# Patient Record
Sex: Male | Born: 1950 | Race: White | Hispanic: No | Marital: Married | Smoking: Never smoker
Health system: Southern US, Community
[De-identification: ages and names within clinical notes are randomized; demographics above are authoritative.]

## PROBLEM LIST (undated history)

## (undated) DIAGNOSIS — N400 Enlarged prostate without lower urinary tract symptoms: Secondary | ICD-10-CM

## (undated) DIAGNOSIS — F32A Depression, unspecified: Secondary | ICD-10-CM

## (undated) DIAGNOSIS — K5790 Diverticulosis of intestine, part unspecified, without perforation or abscess without bleeding: Secondary | ICD-10-CM

## (undated) DIAGNOSIS — G251 Drug-induced tremor: Secondary | ICD-10-CM

## (undated) DIAGNOSIS — I8390 Asymptomatic varicose veins of unspecified lower extremity: Secondary | ICD-10-CM

## (undated) DIAGNOSIS — F319 Bipolar disorder, unspecified: Secondary | ICD-10-CM

## (undated) DIAGNOSIS — F3181 Bipolar II disorder: Secondary | ICD-10-CM

## (undated) DIAGNOSIS — Z973 Presence of spectacles and contact lenses: Secondary | ICD-10-CM

## (undated) DIAGNOSIS — F418 Other specified anxiety disorders: Secondary | ICD-10-CM

## (undated) DIAGNOSIS — R739 Hyperglycemia, unspecified: Secondary | ICD-10-CM

## (undated) DIAGNOSIS — F988 Other specified behavioral and emotional disorders with onset usually occurring in childhood and adolescence: Secondary | ICD-10-CM

## (undated) DIAGNOSIS — H269 Unspecified cataract: Secondary | ICD-10-CM

## (undated) DIAGNOSIS — R7303 Prediabetes: Secondary | ICD-10-CM

## (undated) DIAGNOSIS — R41840 Attention and concentration deficit: Secondary | ICD-10-CM

## (undated) DIAGNOSIS — R972 Elevated prostate specific antigen [PSA]: Secondary | ICD-10-CM

## (undated) HISTORY — DX: Asymptomatic varicose veins of unspecified lower extremity: I83.90

## (undated) HISTORY — DX: Prediabetes: R73.03

## (undated) HISTORY — DX: Hyperglycemia, unspecified: R73.9

## (undated) HISTORY — DX: Other specified anxiety disorders: F41.8

## (undated) HISTORY — DX: Attention and concentration deficit: R41.840

## (undated) HISTORY — DX: Diverticulosis of intestine, part unspecified, without perforation or abscess without bleeding: K57.90

## (undated) HISTORY — DX: Elevated prostate specific antigen (PSA): R97.20

## (undated) HISTORY — DX: Bipolar disorder, unspecified: F31.9

## (undated) HISTORY — PX: WISDOM TOOTH EXTRACTION: SHX21

## (undated) HISTORY — DX: Other specified behavioral and emotional disorders with onset usually occurring in childhood and adolescence: F98.8

## (undated) HISTORY — PX: COLONOSCOPY: SHX174

## (undated) HISTORY — DX: Benign prostatic hyperplasia without lower urinary tract symptoms: N40.0

## (undated) HISTORY — DX: Drug-induced tremor: G25.1

## (undated) HISTORY — DX: Bipolar II disorder: F31.81

## (undated) HISTORY — PX: CATARACT EXTRACTION, BILATERAL: SHX1313

## (undated) HISTORY — DX: Depression, unspecified: F32.A

## (undated) HISTORY — DX: Unspecified cataract: H26.9

---

## 2004-01-23 ENCOUNTER — Encounter: Admission: RE | Admit: 2004-01-23 | Discharge: 2004-01-23 | Payer: Self-pay | Admitting: Family Medicine

## 2006-11-03 ENCOUNTER — Ambulatory Visit: Payer: Self-pay | Admitting: Gastroenterology

## 2006-11-23 ENCOUNTER — Ambulatory Visit: Payer: Self-pay | Admitting: Gastroenterology

## 2009-05-23 ENCOUNTER — Ambulatory Visit (HOSPITAL_COMMUNITY): Admission: RE | Admit: 2009-05-23 | Discharge: 2009-05-23 | Payer: Self-pay | Admitting: Family Medicine

## 2010-03-23 LAB — POCT I-STAT, CHEM 8
BUN: 31 mg/dL — ABNORMAL HIGH (ref 6–23)
Calcium, Ion: 1.1 mmol/L — ABNORMAL LOW (ref 1.12–1.32)
Chloride: 107 mEq/L (ref 96–112)
Creatinine, Ser: 0.9 mg/dL (ref 0.4–1.5)
Glucose, Bld: 88 mg/dL (ref 70–99)
HCT: 45 % (ref 39.0–52.0)
Hemoglobin: 15.3 g/dL (ref 13.0–17.0)
Potassium: 4.8 mEq/L (ref 3.5–5.1)
Sodium: 138 mEq/L (ref 135–145)
TCO2: 25 mmol/L (ref 0–100)

## 2013-02-06 ENCOUNTER — Other Ambulatory Visit: Payer: Self-pay | Admitting: Urology

## 2013-02-06 DIAGNOSIS — R972 Elevated prostate specific antigen [PSA]: Secondary | ICD-10-CM

## 2013-02-14 ENCOUNTER — Ambulatory Visit (HOSPITAL_COMMUNITY)
Admission: RE | Admit: 2013-02-14 | Discharge: 2013-02-14 | Disposition: A | Discharge status: Home / Self Care | Payer: 59 | Source: Ambulatory Visit | Attending: Urology | Admitting: Urology

## 2013-02-14 DIAGNOSIS — K4090 Unilateral inguinal hernia, without obstruction or gangrene, not specified as recurrent: Secondary | ICD-10-CM | POA: Insufficient documentation

## 2013-02-14 DIAGNOSIS — R972 Elevated prostate specific antigen [PSA]: Secondary | ICD-10-CM | POA: Insufficient documentation

## 2013-02-14 DIAGNOSIS — K5730 Diverticulosis of large intestine without perforation or abscess without bleeding: Secondary | ICD-10-CM | POA: Insufficient documentation

## 2013-02-14 LAB — CREATININE, SERUM
Creatinine, Ser: 0.81 mg/dL (ref 0.50–1.35)
GFR calc Af Amer: >90 mL/min (ref >90)
GFR calc non Af Amer: >90 mL/min (ref >90)

## 2013-02-14 MED ORDER — GADOBENATE DIMEGLUMINE 529 MG/ML IV SOLN
20.0000 mL | Freq: Once | INTRAVENOUS | Status: AC | PRN
  Administered 2013-02-14: 17 mL via INTRAVENOUS

## 2013-03-04 ENCOUNTER — Encounter (HOSPITAL_COMMUNITY): Payer: Self-pay | Admitting: Emergency Medicine

## 2013-03-04 ENCOUNTER — Emergency Department (HOSPITAL_COMMUNITY)
Admission: EM | Admit: 2013-03-04 | Discharge: 2013-03-04 | Disposition: A | Discharge status: Home / Self Care | Payer: 59 | Attending: Emergency Medicine | Admitting: Emergency Medicine

## 2013-03-04 DIAGNOSIS — S0990XA Unspecified injury of head, initial encounter: Secondary | ICD-10-CM | POA: Insufficient documentation

## 2013-03-04 DIAGNOSIS — S0100XA Unspecified open wound of scalp, initial encounter: Secondary | ICD-10-CM | POA: Insufficient documentation

## 2013-03-04 DIAGNOSIS — S0101XA Laceration without foreign body of scalp, initial encounter: Secondary | ICD-10-CM

## 2013-03-04 DIAGNOSIS — W1809XA Striking against other object with subsequent fall, initial encounter: Secondary | ICD-10-CM | POA: Insufficient documentation

## 2013-03-04 DIAGNOSIS — Z79899 Other long term (current) drug therapy: Secondary | ICD-10-CM | POA: Insufficient documentation

## 2013-03-04 DIAGNOSIS — Z7982 Long term (current) use of aspirin: Secondary | ICD-10-CM | POA: Insufficient documentation

## 2013-03-04 DIAGNOSIS — Y929 Unspecified place or not applicable: Secondary | ICD-10-CM | POA: Insufficient documentation

## 2013-03-04 DIAGNOSIS — Y939 Activity, unspecified: Secondary | ICD-10-CM | POA: Insufficient documentation

## 2013-03-04 NOTE — ED Notes (Signed)
MD at bedside. 

## 2013-03-04 NOTE — ED Notes (Signed)
Pt slipped on icy stairs falling and hitting posterior head. Pt has apprx 1 inch laceration to posterior head. Pt states takes ASA 81mg  per day. Pt denies LOC.

## 2013-03-04 NOTE — ED Provider Notes (Signed)
CSN: [id number]     Arrival date & time 03/04/13  1133 History   First MD Initiated Contact with Patient 03/04/13 1206     Chief Complaint  Patient presents with  . Fall  . Head Laceration      HPI Pt slipped on ice today and struck his posterior scalp. Laceration with bleeding. 81 mg ASA daily. No LOC. No HA. No neck pain. No weakness of arms or legs. Ambulatory. No other complaints. Mechanical fall. Symptoms are mild  History reviewed. No pertinent past medical history. History reviewed. No pertinent past surgical history. No family history on file. History  Substance Use Topics  . Smoking status: Never Smoker   . Smokeless tobacco: Not on file  . Alcohol Use: Not on file    Review of Systems  All other systems reviewed and are negative.      Allergies  Review of patient's allergies indicates no known allergies.  Home Medications   Current Outpatient Rx  Name  Route  Sig  Dispense  Refill  . Amphetamine-Dextroamphetamine (ADDERALL PO)   Oral   Take 1 tablet by mouth as needed (Uses for when he works).          [name] aspirin EC 81 MG tablet   Oral   Take 81 mg by mouth daily.         [name] Desvenlafaxine Succinate (PRISTIQ PO)   Oral   Take 1 tablet by mouth daily.          BP 158/91  Pulse 74  Temp(Src) 98.7 F (37.1 C) (Oral)  Resp 16  SpO2 99% Physical Exam  Nursing note and vitals reviewed. Constitutional: He is oriented to person, place, and time. He appears well-developed and well-nourished.  HENT:  Head: Normocephalic.  2.5 cm posterior scalp laceration without active bleeding  Eyes: EOM are normal.  Neck: Normal range of motion.  c spine nontender, c spine cleared by nexus criteria  Pulmonary/Chest: Effort normal.  Abdominal: He exhibits no distension.  Musculoskeletal: Normal range of motion.  Neurological: He is alert and oriented to person, place, and time.  Psychiatric: He has a normal mood and affect.    ED Course  Procedures  (including critical care time)  LACERATION REPAIR Performed by: Hoy Morn Consent: Verbal consent obtained. Risks and benefits: risks, benefits and alternatives were discussed Patient identity confirmed: provided demographic data Time out performed prior to procedure Prepped and Draped in normal sterile fashion Wound explored Laceration Location: posterior scalp Laceration Length: 2.5cm No Foreign Bodies seen or palpated Anesthesia: local infiltration Local anesthetic: lidocaine 2% with epinephrine Anesthetic total: 4 ml Irrigation method: syringe Amount of cleaning: standard Skin closure: staples Number of sutures or staples: 7 Technique: staple Patient tolerance: Patient tolerated the procedure well with no immediate complications.  Labs Review Labs Reviewed - No data to display Imaging Review No results found.   EKG Interpretation None      MDM   Final diagnoses:  Scalp laceration  Minor head injury    Head injury warnings. Infection warnings. No indication for head CT. Neuro intact    Hoy Morn, MD 03/04/13 1239

## 2013-03-04 NOTE — Discharge Instructions (Signed)
Head Injury, Adult You have received a head injury. It does not appear serious at this time. Headaches and vomiting are common following head injury. It should be easy to awaken from sleeping. Sometimes it is necessary for you to stay in the emergency department for a while for observation. Sometimes admission to the hospital may be needed. After injuries such as yours, most problems occur within the first 24 hours, but side effects may occur up to 7 10 days after the injury. It is important for you to carefully monitor your condition and contact your health care provider or seek immediate medical care if there is a change in your condition. WHAT ARE THE TYPES OF HEAD INJURIES? Head injuries can be as minor as a bump. Some head injuries can be more severe. More severe head injuries include:  A jarring injury to the brain (concussion). Laceration Care, Adult A laceration is a cut or lesion that goes through all layers of the skin and into the tissue just beneath the skin. TREATMENT  Some lacerations may not require closure. Some lacerations may not be able to be closed due to an increased risk of infection. It is important to see your caregiver as soon as possible after an injury to minimize the risk of infection and maximize the opportunity for successful closure. If closure is appropriate, pain medicines may be given, if needed. The wound will be cleaned to help prevent infection. Your caregiver will use stitches (sutures), staples, wound glue (adhesive), or skin adhesive strips to repair the laceration. These tools bring the skin edges together to allow for faster healing and a better cosmetic outcome. However, all wounds will heal with a scar. Once the wound has healed, scarring can be minimized by covering the wound with sunscreen during the day for 1 full year. HOME CARE INSTRUCTIONS  For sutures or staples: Keep the wound clean and dry. If you were given a bandage (dressing), you should change it  at least once a day. Also, change the dressing if it becomes wet or dirty, or as directed by your caregiver. Wash the wound with soap and water 2 times a day. Rinse the wound off with water to remove all soap. Pat the wound dry with a clean towel. After cleaning, apply a thin layer of the antibiotic ointment as recommended by your caregiver. This will help prevent infection and keep the dressing from sticking. You may shower as usual after the first 24 hours. Do not soak the wound in water until the sutures are removed. Only take over-the-counter or prescription medicines for pain, discomfort, or fever as directed by your caregiver. Get your sutures or staples removed as directed by your caregiver. For skin adhesive strips: Keep the wound clean and dry. Do not get the skin adhesive strips wet. You may bathe carefully, using caution to keep the wound dry. If the wound gets wet, pat it dry with a clean towel. Skin adhesive strips will fall off on their own. You may trim the strips as the wound heals. Do not remove skin adhesive strips that are still stuck to the wound. They will fall off in time. For wound adhesive: You may briefly wet your wound in the shower or bath. Do not soak or scrub the wound. Do not swim. Avoid periods of heavy perspiration until the skin adhesive has fallen off on its own. After showering or bathing, gently pat the wound dry with a clean towel. Do not apply liquid medicine, cream medicine, or  ointment medicine to your wound while the skin adhesive is in place. This may loosen the film before your wound is healed. If a dressing is placed over the wound, be careful not to apply tape directly over the skin adhesive. This may cause the adhesive to be pulled off before the wound is healed. Avoid prolonged exposure to sunlight or tanning lamps while the skin adhesive is in place. Exposure to ultraviolet light in the first year will darken the scar. The skin adhesive will usually  remain in place for 5 to 10 days, then naturally fall off the skin. Do not pick at the adhesive film. You may need a tetanus shot if: You cannot remember when you had your last tetanus shot. You have never had a tetanus shot. If you get a tetanus shot, your arm may swell, get red, and feel warm to the touch. This is common and not a problem. If you need a tetanus shot and you choose not to have one, there is a rare chance of getting tetanus. Sickness from tetanus can be serious. SEEK MEDICAL CARE IF:  You have redness, swelling, or increasing pain in the wound. You see a red line that goes away from the wound. You have yellowish-white fluid (pus) coming from the wound. You have a fever. You notice a bad smell coming from the wound or dressing. Your wound breaks open before or after sutures have been removed. You notice something coming out of the wound such as wood or glass. Your wound is on your hand or foot and you cannot move a finger or toe. SEEK IMMEDIATE MEDICAL CARE IF:  Your pain is not controlled with prescribed medicine. You have severe swelling around the wound causing pain and numbness or a change in color in your arm, hand, leg, or foot. Your wound splits open and starts bleeding. You have worsening numbness, weakness, or loss of function of any joint around or beyond the wound. You develop painful lumps near the wound or on the skin anywhere on your body. MAKE SURE YOU:  Understand these instructions. Will watch your condition. Will get help right away if you are not doing well or get worse. Document Released: 12/21/2004 Document Revised: 03/15/2011 Document Reviewed: 06/16/2010 [facility] Patient Information 2014 [place] [place].   A bruise of the brain (contusion). This mean there is bleeding in the brain that can cause swelling.  A cracked skull (skull fracture).  Bleeding in the brain that collects, clots, and forms a bump (hematoma). WHAT CAUSES A HEAD INJURY? A  serious head injury is most likely to happen to someone who is in a car wreck and is not wearing a seat belt. Other causes of major head injuries include bicycle or motorcycle accidents, sports injuries, and falls. HOW ARE HEAD INJURIES DIAGNOSED? A complete history of the event leading to the injury and your current symptoms will be helpful in diagnosing head injuries. Many times, pictures of the brain, such as CT or MRI are needed to see the extent of the injury. Often, an overnight hospital stay is necessary for observation.  WHEN SHOULD I SEEK IMMEDIATE MEDICAL CARE?  You should get help right away if:  You have confusion or drowsiness.  You feel sick to your stomach (nauseous) or have continued, forceful vomiting.  You have dizziness or unsteadiness that is getting worse.  You have severe, continued headaches not relieved by medicine. Only take over-the-counter or prescription medicines for pain, fever, or discomfort as directed by your  health care provider.  You do not have normal function of the arms or legs or are unable to walk.  You notice changes in the black spots in the center of the colored part of your eye (pupil).  You have a clear or bloody fluid coming from your nose or ears.  You have a loss of vision. During the next 24 hours after the injury, you must stay with someone who can watch you for the warning signs. This person should contact local emergency services (911 in the [place] if you have seizures, you become unconscious, or you are unable to wake up. HOW CAN I PREVENT A HEAD INJURY IN THE FUTURE? The most important factor for preventing major head injuries is avoiding motor vehicle accidents. To minimize the potential for damage to your head, it is crucial to wear seat belts while riding in motor vehicles. Wearing helmets while bike riding and playing collision sports (like football) is also helpful. Also, avoiding dangerous activities around the house will further help  reduce your risk of head injury.  WHEN CAN I RETURN TO NORMAL ACTIVITIES AND ATHLETICS? You should be reevaluated by your health care provider before returning to these activities. If you have any of the following symptoms, you should not return to activities or contact sports until 1 week after the symptoms have stopped:  Persistent headache.  Dizziness or vertigo.  Poor attention and concentration.  Confusion.  Memory problems.  Nausea or vomiting.  Fatigue or tire easily.  Irritability.  Intolerant of bright lights or loud noises.  Anxiety or depression.  Disturbed sleep. MAKE SURE YOU:   Understand these instructions.  Will watch your condition.  Will get help right away if you are not doing well or get worse. Document Released: 12/21/2004 Document Revised: 10/11/2012 Document Reviewed: 08/28/2012 [facility] Patient Information 2014 [place].

## 2013-03-14 ENCOUNTER — Ambulatory Visit (INDEPENDENT_AMBULATORY_CARE_PROVIDER_SITE_OTHER): Payer: 59 | Admitting: General Surgery

## 2013-03-14 ENCOUNTER — Telehealth (INDEPENDENT_AMBULATORY_CARE_PROVIDER_SITE_OTHER): Payer: Self-pay

## 2013-03-14 NOTE — Telephone Encounter (Signed)
LMOM for pt to call me b/c I need to r/s his appt from today b/c an emergency with Dr Donne Hazel that he is having to leave the clinic. I left messages at home and cell.

## 2013-03-14 NOTE — Telephone Encounter (Signed)
LMOM on cell asking for pt to call me back so I can r/s the appt from today to another day with Dr Donne Hazel.

## 2013-03-30 ENCOUNTER — Encounter (INDEPENDENT_AMBULATORY_CARE_PROVIDER_SITE_OTHER): Payer: Self-pay | Admitting: General Surgery

## 2013-03-30 ENCOUNTER — Ambulatory Visit (INDEPENDENT_AMBULATORY_CARE_PROVIDER_SITE_OTHER): Payer: Commercial Managed Care - PPO | Admitting: General Surgery

## 2013-03-30 VITALS — BP 124/72 | HR 70 | Resp 16 | Ht 70.0 in | Wt 182.0 lb

## 2013-03-30 DIAGNOSIS — K4090 Unilateral inguinal hernia, without obstruction or gangrene, not specified as recurrent: Secondary | ICD-10-CM

## 2013-03-30 NOTE — Progress Notes (Signed)
Patient ID: Brian Guerrero, male   DOB: [birth year], 63 y.o.   MRN: [id number]  Chief Complaint  Patient presents with  . Other    Eval RIH    HPI Brian Guerrero is a 63 y.o. male.  Referred by Dr Maury Dus HPI 34 yom who has had pain in the right groin for about a month associated with a bulge that he reduces from time to time.  He has no changes in bms or urinating.  He does have some nocturia and some decreased stream. Has history of superficial phlebitis treated with asa. Wears stockings while working. He works as Product manager at [employer].  Past Medical History  Diagnosis Date  . Attention deficit disorder   . Diverticular disease     [facility]  . BPH (benign prostatic hyperplasia)   . Depression with anxiety   . Varicose veins     History reviewed. No pertinent past surgical history.  History reviewed. No pertinent family history.  Social History History  Substance Use Topics  . Smoking status: Never Smoker   . Smokeless tobacco: Not on file  . Alcohol Use: Yes    Allergies  Allergen Reactions  . Lincomycin Hcl     Current Outpatient Prescriptions  Medication Sig Dispense Refill  . aspirin EC 81 MG tablet Take 81 mg by mouth daily.      [name] Desvenlafaxine Succinate (PRISTIQ PO) Take 1 tablet by mouth daily.      [name] lithium carbonate 150 MG capsule Take 150 mg by mouth 3 (three) times daily with meals.      . Amphetamine-Dextroamphetamine (ADDERALL PO) Take 1 tablet by mouth as needed (Uses for when he works).        No current facility-administered medications for this visit.    Review of Systems Review of Systems  Constitutional: Negative for fever, chills and unexpected weight change.  HENT: Negative for congestion, hearing loss, sore throat, trouble swallowing and voice change.   Eyes: Negative for visual disturbance.  Respiratory: Negative for cough and wheezing.   Cardiovascular: Negative for chest pain, palpitations and leg swelling.   Gastrointestinal: Negative for nausea, vomiting, abdominal pain, diarrhea, constipation, blood in stool, abdominal distention, anal bleeding and rectal pain.  Genitourinary: Negative for hematuria and difficulty urinating.  Musculoskeletal: Negative for arthralgias.  Skin: Negative for rash and wound.  Neurological: Negative for seizures, syncope, weakness and headaches.  Hematological: Negative for adenopathy. Does not bruise/bleed easily.  Psychiatric/Behavioral: Negative for confusion.    Blood pressure 124/72, pulse 70, resp. rate 16, height 5\' 10"  (1.778 m), weight 182 lb (82.555 kg).  Physical Exam Physical Exam  Constitutional: He appears well-developed and well-nourished.  Neck: Neck supple.  Cardiovascular: Normal rate, regular rhythm and normal heart sounds.   Pulmonary/Chest: Effort normal and breath sounds normal. He has no wheezes. He has no rales.  Abdominal: Soft. Bowel sounds are normal. He exhibits no distension. There is no tenderness. A hernia is present. Hernia confirmed positive in the right inguinal area. Hernia confirmed negative in the ventral area and confirmed negative in the left inguinal area.  Lymphadenopathy:    He has no cervical adenopathy.    Data Reviewed Notes from Dr Alyson Ingles  Assessment    Right inguinal hernia     Plan    Right inguinal hernia repair with mesh  We discussed observation versus repair.  We discussed both laparoscopic and open inguinal hernia repairs. I described the procedure in detail.  Goals should be achieved with surgery. We discussed the usage of mesh and the rationale behind that. We went over the pathophysiology of inguinal hernia. We have elected to perform open inguinal hernia repair with mesh.  We discussed the risks including bleeding, infection, recurrence, postoperative pain and chronic groin pain, testicular injury, urinary retention, numbness in groin and around incision.           Brian Guerrero 03/30/2013, 9:53 AM

## 2013-04-24 ENCOUNTER — Encounter (HOSPITAL_BASED_OUTPATIENT_CLINIC_OR_DEPARTMENT_OTHER): Payer: Self-pay | Admitting: *Deleted

## 2013-04-24 NOTE — Progress Notes (Signed)
Works Scientist, physiological come in for labs

## 2013-04-26 ENCOUNTER — Encounter (HOSPITAL_BASED_OUTPATIENT_CLINIC_OR_DEPARTMENT_OTHER)
Admission: RE | Admit: 2013-04-26 | Discharge: 2013-04-26 | Disposition: A | Discharge status: Home / Self Care | Payer: 59 | Source: Ambulatory Visit | Attending: General Surgery | Admitting: General Surgery

## 2013-04-26 DIAGNOSIS — Z01812 Encounter for preprocedural laboratory examination: Secondary | ICD-10-CM | POA: Insufficient documentation

## 2013-04-26 LAB — CBC WITH DIFFERENTIAL/PLATELET
Basophils Absolute: 0 10*3/uL (ref 0.0–0.1)
Basophils Relative: 1 % (ref 0–1)
Eosinophils Absolute: 0.3 10*3/uL (ref 0.0–0.7)
Eosinophils Relative: 5 % (ref 0–5)
HCT: 42.3 % (ref 39.0–52.0)
Hemoglobin: 14.6 g/dL (ref 13.0–17.0)
Lymphocytes Relative: 19 % (ref 12–46)
Lymphs Abs: 1.2 10*3/uL (ref 0.7–4.0)
MCH: 31.1 pg (ref 26.0–34.0)
MCHC: 34.5 g/dL (ref 30.0–36.0)
MCV: 90.2 fL (ref 78.0–100.0)
Monocytes Absolute: 0.7 10*3/uL (ref 0.1–1.0)
Monocytes Relative: 11 % (ref 3–12)
Neutro Abs: 4.1 10*3/uL (ref 1.7–7.7)
Neutrophils Relative %: 64 % (ref 43–77)
Platelets: 266 10*3/uL (ref 150–400)
RBC: 4.69 MIL/uL (ref 4.22–5.81)
RDW: 13.4 % (ref 11.5–15.5)
WBC: 6.4 10*3/uL (ref 4.0–10.5)

## 2013-04-26 LAB — BASIC METABOLIC PANEL
BUN: 21 mg/dL (ref 6–23)
CO2: 25 mEq/L (ref 19–32)
Calcium: 9.1 mg/dL (ref 8.4–10.5)
Chloride: 105 mEq/L (ref 96–112)
Creatinine, Ser: 0.85 mg/dL (ref 0.50–1.35)
GFR calc Af Amer: >90 mL/min (ref >90)
GFR calc non Af Amer: >90 mL/min (ref >90)
Glucose, Bld: 93 mg/dL (ref 70–99)
Potassium: 4.7 mEq/L (ref 3.7–5.3)
Sodium: 140 mEq/L (ref 137–147)

## 2013-04-30 ENCOUNTER — Ambulatory Visit (HOSPITAL_BASED_OUTPATIENT_CLINIC_OR_DEPARTMENT_OTHER): Payer: 59 | Admitting: Anesthesiology

## 2013-04-30 ENCOUNTER — Encounter (HOSPITAL_BASED_OUTPATIENT_CLINIC_OR_DEPARTMENT_OTHER): Payer: Self-pay

## 2013-04-30 ENCOUNTER — Encounter (HOSPITAL_BASED_OUTPATIENT_CLINIC_OR_DEPARTMENT_OTHER)
Admission: RE | Disposition: A | Discharge status: Home / Self Care | Payer: Self-pay | Source: Ambulatory Visit | Attending: General Surgery

## 2013-04-30 ENCOUNTER — Ambulatory Visit (HOSPITAL_BASED_OUTPATIENT_CLINIC_OR_DEPARTMENT_OTHER)
Admission: RE | Admit: 2013-04-30 | Discharge: 2013-04-30 | Disposition: A | Discharge status: Home / Self Care | Payer: 59 | Source: Ambulatory Visit | Attending: General Surgery | Admitting: General Surgery

## 2013-04-30 ENCOUNTER — Encounter (HOSPITAL_BASED_OUTPATIENT_CLINIC_OR_DEPARTMENT_OTHER): Payer: 59 | Admitting: Anesthesiology

## 2013-04-30 DIAGNOSIS — D176 Benign lipomatous neoplasm of spermatic cord: Secondary | ICD-10-CM | POA: Insufficient documentation

## 2013-04-30 DIAGNOSIS — F988 Other specified behavioral and emotional disorders with onset usually occurring in childhood and adolescence: Secondary | ICD-10-CM | POA: Insufficient documentation

## 2013-04-30 DIAGNOSIS — K5730 Diverticulosis of large intestine without perforation or abscess without bleeding: Secondary | ICD-10-CM | POA: Insufficient documentation

## 2013-04-30 DIAGNOSIS — F329 Major depressive disorder, single episode, unspecified: Secondary | ICD-10-CM | POA: Insufficient documentation

## 2013-04-30 DIAGNOSIS — F3289 Other specified depressive episodes: Secondary | ICD-10-CM | POA: Insufficient documentation

## 2013-04-30 DIAGNOSIS — K4090 Unilateral inguinal hernia, without obstruction or gangrene, not specified as recurrent: Secondary | ICD-10-CM

## 2013-04-30 DIAGNOSIS — N400 Enlarged prostate without lower urinary tract symptoms: Secondary | ICD-10-CM | POA: Insufficient documentation

## 2013-04-30 DIAGNOSIS — Z7982 Long term (current) use of aspirin: Secondary | ICD-10-CM | POA: Insufficient documentation

## 2013-04-30 DIAGNOSIS — I8390 Asymptomatic varicose veins of unspecified lower extremity: Secondary | ICD-10-CM | POA: Insufficient documentation

## 2013-04-30 DIAGNOSIS — F411 Generalized anxiety disorder: Secondary | ICD-10-CM | POA: Insufficient documentation

## 2013-04-30 HISTORY — DX: Presence of spectacles and contact lenses: Z97.3

## 2013-04-30 HISTORY — PX: INGUINAL HERNIA REPAIR: SHX194

## 2013-04-30 SURGERY — REPAIR, HERNIA, INGUINAL, ADULT
Anesthesia: General | Laterality: Right

## 2013-04-30 MED ORDER — OXYCODONE HCL 5 MG PO TABS
5.0000 mg | ORAL_TABLET | Freq: Once | ORAL | Status: AC | PRN
  Administered 2013-04-30: 5 mg via ORAL

## 2013-04-30 MED ORDER — FENTANYL CITRATE 0.05 MG/ML IJ SOLN
INTRAMUSCULAR | Status: AC
  Filled 2013-04-30: qty 6

## 2013-04-30 MED ORDER — LIDOCAINE HCL (CARDIAC) 20 MG/ML IV SOLN
INTRAVENOUS | Status: DC | PRN
  Administered 2013-04-30: 50 mg via INTRAVENOUS

## 2013-04-30 MED ORDER — MIDAZOLAM HCL 5 MG/5ML IJ SOLN
INTRAMUSCULAR | Status: DC | PRN
  Administered 2013-04-30 (×2): 1 mg via INTRAVENOUS

## 2013-04-30 MED ORDER — PROPOFOL 10 MG/ML IV BOLUS
INTRAVENOUS | Status: DC | PRN
  Administered 2013-04-30: 200 mg via INTRAVENOUS

## 2013-04-30 MED ORDER — PROMETHAZINE HCL 25 MG/ML IJ SOLN: 6.2500 mg | INTRAMUSCULAR | Status: DC | PRN

## 2013-04-30 MED ORDER — LACTATED RINGERS IV SOLN
INTRAVENOUS | Status: DC
  Administered 2013-04-30 (×2): via INTRAVENOUS

## 2013-04-30 MED ORDER — DEXAMETHASONE SODIUM PHOSPHATE 10 MG/ML IJ SOLN
INTRAMUSCULAR | Status: DC | PRN
  Administered 2013-04-30: 6 mg

## 2013-04-30 MED ORDER — BUPIVACAINE-EPINEPHRINE PF 0.5-1:200000 % IJ SOLN
INTRAMUSCULAR | Status: DC | PRN
  Administered 2013-04-30: 150 mg

## 2013-04-30 MED ORDER — MIDAZOLAM HCL 2 MG/2ML IJ SOLN
INTRAMUSCULAR | Status: AC
  Filled 2013-04-30: qty 2

## 2013-04-30 MED ORDER — CEFAZOLIN SODIUM-DEXTROSE 2-3 GM-% IV SOLR
INTRAVENOUS | Status: AC
  Filled 2013-04-30: qty 50

## 2013-04-30 MED ORDER — DEXAMETHASONE SODIUM PHOSPHATE 4 MG/ML IJ SOLN
INTRAMUSCULAR | Status: DC | PRN
  Administered 2013-04-30: 8 mg via INTRAVENOUS

## 2013-04-30 MED ORDER — OXYCODONE HCL 5 MG PO TABS
ORAL_TABLET | ORAL | Status: AC
  Filled 2013-04-30: qty 1

## 2013-04-30 MED ORDER — FENTANYL CITRATE 0.05 MG/ML IJ SOLN
50.0000 ug | INTRAMUSCULAR | Status: DC | PRN
  Administered 2013-04-30: 100 ug via INTRAVENOUS

## 2013-04-30 MED ORDER — MIDAZOLAM HCL 2 MG/2ML IJ SOLN
1.0000 mg | INTRAMUSCULAR | Status: DC | PRN
  Administered 2013-04-30: 2 mg via INTRAVENOUS

## 2013-04-30 MED ORDER — CEFAZOLIN SODIUM-DEXTROSE 2-3 GM-% IV SOLR
2.0000 g | INTRAVENOUS | Status: AC
  Administered 2013-04-30: 2 g via INTRAVENOUS

## 2013-04-30 MED ORDER — BUPIVACAINE HCL (PF) 0.25 % IJ SOLN
INTRAMUSCULAR | Status: AC
  Filled 2013-04-30: qty 30

## 2013-04-30 MED ORDER — HYDROMORPHONE HCL PF 1 MG/ML IJ SOLN: 0.2500 mg | INTRAMUSCULAR | Status: DC | PRN

## 2013-04-30 MED ORDER — OXYCODONE-ACETAMINOPHEN 10-325 MG PO TABS: 1.0000 | ORAL_TABLET | Freq: Four times a day (QID) | ORAL | Status: DC | PRN

## 2013-04-30 MED ORDER — FENTANYL CITRATE 0.05 MG/ML IJ SOLN
INTRAMUSCULAR | Status: AC
  Filled 2013-04-30: qty 2

## 2013-04-30 MED ORDER — BUPIVACAINE HCL (PF) 0.25 % IJ SOLN
INTRAMUSCULAR | Status: DC | PRN
  Administered 2013-04-30: 8 mL

## 2013-04-30 MED ORDER — OXYCODONE HCL 5 MG/5ML PO SOLN: 5.0000 mg | Freq: Once | ORAL | Status: AC | PRN

## 2013-04-30 MED ORDER — ONDANSETRON HCL 4 MG/2ML IJ SOLN
INTRAMUSCULAR | Status: DC | PRN
  Administered 2013-04-30: 4 mg via INTRAVENOUS

## 2013-04-30 SURGICAL SUPPLY — 43 items
ADH SKN CLS APL DERMABOND .7 (GAUZE/BANDAGES/DRESSINGS) ×1
BLADE 15 SAFETY STRL DISP (BLADE) ×2 IMPLANT
BLADE SURG ROTATE 9660 (MISCELLANEOUS) ×1 IMPLANT
CHLORAPREP W/TINT 26ML (MISCELLANEOUS) ×2 IMPLANT
COVER MAYO STAND STRL (DRAPES) ×2 IMPLANT
COVER TABLE BACK 60X90 (DRAPES) ×2 IMPLANT
DECANTER SPIKE VIAL GLASS SM (MISCELLANEOUS) IMPLANT
DERMABOND ADVANCED (GAUZE/BANDAGES/DRESSINGS) ×1
DERMABOND ADVANCED .7 DNX12 (GAUZE/BANDAGES/DRESSINGS) ×1 IMPLANT
DRAIN PENROSE 1/2X12 LTX STRL (WOUND CARE) ×2 IMPLANT
DRAPE PED LAPAROTOMY (DRAPES) ×2 IMPLANT
ELECT COATED BLADE 2.86 ST (ELECTRODE) ×2 IMPLANT
ELECT REM PT RETURN 9FT ADLT (ELECTROSURGICAL) ×2
ELECTRODE REM PT RTRN 9FT ADLT (ELECTROSURGICAL) ×1 IMPLANT
GLOVE BIO SURGEON STRL SZ7 (GLOVE) ×2 IMPLANT
GLOVE BIOGEL M 7.0 STRL (GLOVE) ×1 IMPLANT
GLOVE BIOGEL PI IND STRL 6.5 (GLOVE) IMPLANT
GLOVE BIOGEL PI IND STRL 7.5 (GLOVE) ×1 IMPLANT
GLOVE BIOGEL PI INDICATOR 6.5 (GLOVE) ×1
GLOVE BIOGEL PI INDICATOR 7.5 (GLOVE) ×2
GLOVE ECLIPSE 6.5 STRL STRAW (GLOVE) ×1 IMPLANT
GLOVE EXAM NITRILE LRG STRL (GLOVE) ×1 IMPLANT
GOWN STRL REUS W/ TWL LRG LVL3 (GOWN DISPOSABLE) ×2 IMPLANT
GOWN STRL REUS W/TWL LRG LVL3 (GOWN DISPOSABLE) ×6
MESH ULTRAPRO 3X6 7.6X15CM (Mesh General) ×1 IMPLANT
NEEDLE HYPO 22GX1.5 SAFETY (NEEDLE) ×2 IMPLANT
NS IRRIG 1000ML POUR BTL (IV SOLUTION) ×1 IMPLANT
PACK BASIN DAY SURGERY FS (CUSTOM PROCEDURE TRAY) ×2 IMPLANT
PENCIL BUTTON HOLSTER BLD 10FT (ELECTRODE) ×2 IMPLANT
SLEEVE SCD COMPRESS KNEE MED (MISCELLANEOUS) ×1 IMPLANT
SPONGE LAP 4X18 X RAY DECT (DISPOSABLE) ×2 IMPLANT
STRIP CLOSURE SKIN 1/2X4 (GAUZE/BANDAGES/DRESSINGS) IMPLANT
SUT MNCRL AB 4-0 PS2 18 (SUTURE) ×2 IMPLANT
SUT SILK 2 0 SH (SUTURE) IMPLANT
SUT VIC AB 0 SH 27 (SUTURE) IMPLANT
SUT VIC AB 2-0 SH 27 (SUTURE) ×10
SUT VIC AB 2-0 SH 27XBRD (SUTURE) ×2 IMPLANT
SUT VIC AB 3-0 SH 27 (SUTURE) ×2
SUT VIC AB 3-0 SH 27X BRD (SUTURE) ×1 IMPLANT
SUT VICRYL AB 3 0 TIES (SUTURE) IMPLANT
SYR CONTROL 10ML LL (SYRINGE) ×2 IMPLANT
TOWEL OR 17X24 6PK STRL BLUE (TOWEL DISPOSABLE) ×2 IMPLANT
TOWEL OR NON WOVEN STRL DISP B (DISPOSABLE) ×2 IMPLANT

## 2013-04-30 NOTE — Progress Notes (Signed)
Assisted Dr. Tobias Alexander with right, ultrasound guided, transabdominal plane block. Side rails up, monitors on throughout procedure. See vital signs in flow sheet. Tolerated Procedure well.

## 2013-04-30 NOTE — Anesthesia Preprocedure Evaluation (Signed)
Anesthesia Evaluation  Patient identified by MRN, date of birth, ID band Patient awake    Reviewed: Allergy & Precautions, H&P , NPO status , Patient's Chart, lab work & pertinent test results  Airway Mallampati: II TM Distance: >3 FB Neck ROM: full    Dental  (+) Teeth Intact, Dental Advidsory Given   Pulmonary neg pulmonary ROS,  breath sounds clear to auscultation        Cardiovascular negative cardio ROS  Rhythm:regular Rate:Normal     Neuro/Psych negative neurological ROS  negative psych ROS   GI/Hepatic negative GI ROS, Neg liver ROS,   Endo/Other  negative endocrine ROS  Renal/GU negative Renal ROS     Musculoskeletal   Abdominal   Peds  Hematology   Anesthesia Other Findings   Reproductive/Obstetrics negative OB ROS                           Anesthesia Physical Anesthesia Plan  ASA: II  Anesthesia Plan:    Post-op Pain Management:    Induction:   Airway Management Planned:   Additional Equipment:   Intra-op Plan:   Post-operative Plan:   Informed Consent:   Dental Advisory Given  Plan Discussed with: Anesthesiologist, CRNA and Surgeon  Anesthesia Plan Comments:         Anesthesia Quick Evaluation

## 2013-04-30 NOTE — Discharge Instructions (Signed)
[facility] ° °UMBILICAL OR INGUINAL HERNIA REPAIR: POST OP INSTRUCTIONS ° °Always review your discharge instruction sheet given to you by the facility where your surgery was performed. °IF YOU HAVE DISABILITY OR FAMILY LEAVE FORMS, YOU MUST BRING THEM TO THE OFFICE FOR PROCESSING.   °DO NOT GIVE THEM TO YOUR DOCTOR. ° °1. A  prescription for pain medication may be given to you upon discharge.  Take your pain medication as prescribed, if needed.  If narcotic pain medicine is not needed, then you may take acetaminophen (Tylenol), naprosyn (Alleve) or ibuprofen (Advil) as needed. °2. Take your usually prescribed medications unless otherwise directed. °3. If you need a refill on your pain medication, please contact your pharmacy.  They will contact our office to request authorization. Prescriptions will not be filled after 5 pm or on week-ends. °4. You should follow a light diet the first 24 hours after arrival home, such as soup and crackers, etc.  Be sure to include lots of fluids daily.  Resume your normal diet the day after surgery. °5. Most patients will experience some swelling and bruising around the umbilicus or in the groin and scrotum.  Ice packs and reclining will help.  Swelling and bruising can take several days to resolve.  °6. It is common to experience some constipation if taking pain medication after surgery.  Increasing fluid intake and taking a stool softener (such as Colace) will usually help or prevent this problem from occurring.  A mild laxative (Milk of Magnesia or Miralax) should be taken according to package directions if there are no bowel movements after 48 hours. °7. Unless discharge instructions indicate otherwise, you may remove your bandages 48 hours after surgery, and you may shower at that time.  You may have steri-strips (small skin tapes) in place directly over the incision.  These strips should be left on the skin for 7-10 days and will come off on their own.   If your surgeon used skin glue on the incision, you may shower in 24 hours.  The glue will flake off over the next 2-3 weeks.  Any sutures or staples will be removed at the office during your follow-up visit. °8. ACTIVITIES:  You may resume regular (light) daily activities beginning the next day--such as daily self-care, walking, climbing stairs--gradually increasing activities as tolerated.  You may have sexual intercourse when it is comfortable.  Refrain from any heavy lifting or straining until approved by your doctor. °a. You may drive when you are no longer taking prescription pain medication, you can comfortably wear a seatbelt, and you can safely maneuver your car and apply brakes. °b. RETURN TO WORK:  __________________________________________________________ °9. You should see your doctor in the office for a follow-up appointment approximately 2-3 weeks after your surgery.  Make sure that you call for this appointment within a day or two after you arrive home to insure a convenient appointment time. °10. OTHER INSTRUCTIONS:  __________________________________________________________________________________________________________________________________________________________________________________________  °WHEN TO CALL YOUR DOCTOR: °1. Fever over 101.0 °2. Inability to urinate °3. Nausea and/or vomiting °4. Extreme swelling or bruising °5. Continued bleeding from incision. °6. Increased pain, redness, or drainage from the incision ° °The clinic staff is available to answer your questions during regular business hours.  Please don’t hesitate to call and ask to speak to one of the nurses for clinical concerns.  If you have a medical emergency, go to the nearest emergency room or call 911.  A surgeon from [facility]   is always on call at the hospital ° ° °[address], [address], [place], [place]  [address] ? ° [address] [address], [place], [place]   [address] °[phone] ? [phone] ? FAX  [phone] °Web site: [email or web address] ° ° °Post Anesthesia Home Care Instructions ° °Activity: °Get plenty of rest for the remainder of the day. A responsible adult should stay with you for 24 hours following the procedure.  °For the next 24 hours, DO NOT: °-Drive a car °-Operate machinery °-Drink alcoholic beverages °-Take any medication unless instructed by your physician °-Make any legal decisions or sign important papers. ° °Meals: °Start with liquid foods such as gelatin or soup. Progress to regular foods as tolerated. Avoid greasy, spicy, heavy foods. If nausea and/or vomiting occur, drink only clear liquids until the nausea and/or vomiting subsides. Call your physician if vomiting continues. ° °Special Instructions/Symptoms: °Your throat may feel dry or sore from the anesthesia or the breathing tube placed in your throat during surgery. If this causes discomfort, gargle with warm salt water. The discomfort should disappear within 24 hours. ° ° °Call your surgeon if you experience:  ° °1.  Fever over 101.0. °2.  Inability to urinate. °3.  Nausea and/or vomiting. °4.  Extreme swelling or bruising at the surgical site. °5.  Continued bleeding from the incision. °6.  Increased pain, redness or drainage from the incision. °7.  Problems related to your pain medication. ° °

## 2013-04-30 NOTE — H&P (Signed)
  31 yom who has had pain in the right groin for about a month associated with a bulge that he reduces from time to time. He has no changes in bms or urinating. He does have some nocturia and some decreased stream.  Has history of superficial phlebitis treated with asa. Wears stockings while working. He works as Product manager at [employer].   Past Medical History   Diagnosis  Date   .  Attention deficit disorder    .  Diverticular disease      [facility]   .  BPH (benign prostatic hyperplasia)    .  Depression with anxiety    .  Varicose veins    History reviewed. No pertinent past surgical history.  History reviewed. No pertinent family history.  Social History  History   Substance Use Topics   .  Smoking status:  Never Smoker   .  Smokeless tobacco:  Not on file   .  Alcohol Use:  Yes    Allergies   Allergen  Reactions   .  Lincomycin Hcl     Current Outpatient Prescriptions   Medication  Sig  Dispense  Refill   .  aspirin EC 81 MG tablet  Take 81 mg by mouth daily.     [name]  Desvenlafaxine Succinate (PRISTIQ PO)  Take 1 tablet by mouth daily.     [name]  lithium carbonate 150 MG capsule  Take 150 mg by mouth 3 (three) times daily with meals.     .  Amphetamine-Dextroamphetamine (ADDERALL PO)  Take 1 tablet by mouth as needed (Uses for when he works).      No current facility-administered medications for this visit.   Review of Systems  Review of Systems  Constitutional: Negative for fever, chills and unexpected weight change. [name]  Respiratory: Negative for cough and wheezing.  Cardiovascular: Negative for chest pain, palpitations and leg swelling.  Gastrointestinal: Negative for nausea, vomiting, abdominal pain, diarrhea, constipation, blood in stool, abdominal distention, anal bleeding and rectal pain.  Genitourinary: Negative for hematuria and difficulty urinating.   Physical Exam  Constitutional: He appears well-developed and well-nourished.  Pulmonary/Chest: Effort normal  and breath sounds normal. He has no wheezes. He has no rales.  Abdominal: Soft. Bowel sounds are normal. He exhibits no distension. There is no tenderness. A hernia is present. Hernia confirmed positive in the right inguinal area. Hernia confirmed negative in the ventral area and confirmed negative in the left inguinal area.  Lymphadenopathy:  He has no cervical adenopathy.   Assessment  Right inguinal hernia  Plan  Right inguinal hernia repair with mesh  We discussed observation versus repair. We discussed both laparoscopic and open inguinal hernia repairs. I described the procedure in detail. Goals should be achieved with surgery. We discussed the usage of mesh and the rationale behind that. We went over the pathophysiology of inguinal hernia. We have elected to perform open inguinal hernia repair with mesh. We discussed the risks including bleeding, infection, recurrence, postoperative pain and chronic groin pain, testicular injury, urinary retention, numbness in groin and around incision.

## 2013-04-30 NOTE — Transfer of Care (Signed)
Immediate Anesthesia Transfer of Care Note  Patient: Brian Guerrero  Procedure(s) Performed: Procedure(s): HERNIA REPAIR INGUINAL ADULT (Right)  Patient Location: PACU  Anesthesia Type:General and Regional  Level of Consciousness: sedated  Airway & Oxygen Therapy: Patient Spontanous Breathing and Patient connected to face mask oxygen  Post-op Assessment: Report given to PACU RN and Post -op Vital signs reviewed and stable  Post vital signs: Reviewed and stable  Complications: No apparent anesthesia complications

## 2013-04-30 NOTE — Anesthesia Postprocedure Evaluation (Signed)
Anesthesia Post Note  Patient: Brian Guerrero  Procedure(s) Performed: Procedure(s) (LRB): HERNIA REPAIR INGUINAL ADULT (Right)  Anesthesia type: general  Patient location: PACU  Post pain: Pain level controlled  Post assessment: Patient's Cardiovascular Status Stable  Last Vitals:  Filed Vitals:   04/30/13 1330  BP: 128/67  Pulse: 77  Temp:   Resp: 14    Post vital signs: Reviewed and stable  Level of consciousness: sedated  Complications: No apparent anesthesia complications

## 2013-04-30 NOTE — Op Note (Signed)
Preoperative diagnosis: Right inguinal hernia Postoperative diagnosis: Indirect right inguinal hernia Procedure: Right inguinal hernia repair with UltraPro mesh patch Surgeon: Dr. Serita Grammes Anesthesia: Gen. With TAP Block Drains: None Specimens: None Estimated blood loss: Minimal Complications: None Sponge and needle count was correct at completion Disposition to recovery stable  Indications: This is a 63 year old male who has a symptomatic right groin hernia. We discussed options and decided to proceed with an open right inguinal hernia with mesh.  Procedure: After informed consent was obtained the patient was taken to the operating room. He underwent a TAP block prior to beginning. He was in place a general anesthesia without complication. Sequential compression devices on his legs. He was given cefazolin. He was then prepped and draped in the standard sterile surgical fashion. A surgical timeout was then performed.  I infiltrated Marcaine in the skin overlying the where the incision would be made. I then made in the right groin incision. I carried this down to the superficial epigastric vessels which I ligated with Vicryl ties. I then identified the external oblique. I incised this through the external ring. I was unable to encircle the spermatic cord. He very clearly had an indirect hernia with a cord lipoma. This floor was weak but there was no obvious direct hernia. I then encircled the cord with a Penrose drain. I then was able to excise the cord lipoma and reduced the hernia sac back into the abdomen. I then closed the internal ring with 2-0 Vicryl suture. I used a UltraPro mesh patch and fashioned this to cover the entire floor. This was sutured into position at the pubic tubercle with 2-0 Vicryl. I then made a cut and wrapped this around the spermatic cord. I closed these ends together and sutured these to the oblique superiorly. I sutured this inferiorly to the shelving edge every  centimeter. I laid the lateral portions flap. This completely obliterated the defect in the hernia. Hemostasis was observed. I then closed the external oblique with a 2-0 Vicryl. Scarpa's fascia was closed with 3-0 Vicryl. The skin was closed with 4-0 Monocryl and Dermabond. His testicle was in the scrotum at completion. He tolerated this well was extubated and transferred to recovery stable.

## 2013-04-30 NOTE — Anesthesia Procedure Notes (Addendum)
Anesthesia Regional Block:  TAP block  Pre-Anesthetic Checklist: ,, timeout performed, Correct Patient, Correct Site, Correct Laterality, Correct Procedure, Correct Position, site marked, Risks and benefits discussed,  Surgical consent,  Pre-op evaluation,  At surgeon's request and post-op pain management  Laterality: Right  Prep: chloraprep       Needles:  Injection technique: Single-shot  Needle Type: Echogenic Stimulator Needle     Needle Length: 10cm 10 cm Needle Gauge: 21 and 21 G    Additional Needles:  Procedures: ultrasound guided (picture in chart) TAP block Narrative:  Start time: 04/30/2013 11:13 AM End time: 04/30/2013 11:22 AM Injection made incrementally with aspirations every 5 mL.  Performed by: Personally    Procedure Name: LMA Insertion Date/Time: 04/30/2013 11:48 AM Performed by: Melynda Ripple D Pre-anesthesia Checklist: Patient identified, Emergency Drugs available, Suction available and Patient being monitored Patient Re-evaluated:Patient Re-evaluated prior to inductionOxygen Delivery Method: Circle System Utilized Preoxygenation: Pre-oxygenation with 100% oxygen Intubation Type: IV induction Ventilation: Mask ventilation without difficulty LMA: LMA inserted LMA Size: 5.0 Number of attempts: 1 Airway Equipment and Method: bite block Placement Confirmation: positive ETCO2 Tube secured with: Tape Dental Injury: Teeth and Oropharynx as per pre-operative assessment

## 2013-05-01 ENCOUNTER — Encounter (HOSPITAL_BASED_OUTPATIENT_CLINIC_OR_DEPARTMENT_OTHER): Payer: Self-pay | Admitting: General Surgery

## 2013-05-02 ENCOUNTER — Telehealth (INDEPENDENT_AMBULATORY_CARE_PROVIDER_SITE_OTHER): Payer: Self-pay

## 2013-05-02 NOTE — Telephone Encounter (Signed)
LMOM that i r/s the appt on 5/5 with DR Donne Hazel to 5/22. I know the appt on 5/5 was originally made when the sx date was on 4/16 but the sx got r/s to 4/27. I advised that we normally don't check pt's till 2-3wks out from hernia repair.

## 2013-05-08 ENCOUNTER — Encounter (INDEPENDENT_AMBULATORY_CARE_PROVIDER_SITE_OTHER): Payer: Commercial Managed Care - PPO | Admitting: General Surgery

## 2013-05-25 ENCOUNTER — Encounter (INDEPENDENT_AMBULATORY_CARE_PROVIDER_SITE_OTHER): Payer: Self-pay | Admitting: General Surgery

## 2013-05-25 ENCOUNTER — Ambulatory Visit (INDEPENDENT_AMBULATORY_CARE_PROVIDER_SITE_OTHER): Payer: Commercial Managed Care - PPO | Admitting: General Surgery

## 2013-05-25 VITALS — BP 130/76 | HR 78 | Temp 98.2°F | Ht 69.0 in | Wt 185.0 lb

## 2013-05-25 DIAGNOSIS — Z09 Encounter for follow-up examination after completed treatment for conditions other than malignant neoplasm: Secondary | ICD-10-CM

## 2013-05-25 NOTE — Progress Notes (Signed)
Subjective:     Patient ID: Brian Guerrero, male   DOB: December 25, 1950, 63 y.o.   MRN: [id number]  HPI This is a 63 year old male who underwent a right inguinal hernia repair in April 27. He returns today doing well without any complaints. His back and most of his normal activity.  Review of Systems     Objective:   Physical Exam Healing right groin incision without any evidence of infection or seroma    Assessment:     Status post right inguinal hernia repair     Plan:     He is released to full activities. I will see him back as needed.

## 2013-12-27 ENCOUNTER — Encounter (HOSPITAL_COMMUNITY): Payer: Self-pay | Admitting: Family Medicine

## 2013-12-27 ENCOUNTER — Emergency Department (HOSPITAL_COMMUNITY)
Admission: EM | Admit: 2013-12-27 | Discharge: 2013-12-27 | Disposition: A | Discharge status: Home / Self Care | Payer: 59 | Source: Home / Self Care | Attending: Family Medicine | Admitting: Family Medicine

## 2013-12-27 DIAGNOSIS — G5702 Lesion of sciatic nerve, left lower limb: Secondary | ICD-10-CM

## 2013-12-27 DIAGNOSIS — M461 Sacroiliitis, not elsewhere classified: Secondary | ICD-10-CM

## 2013-12-27 MED ORDER — METHOCARBAMOL 500 MG PO TABS: 500.0000 mg | ORAL_TABLET | Freq: Four times a day (QID) | ORAL | Status: DC | PRN

## 2013-12-27 MED ORDER — PREDNISONE 10 MG PO KIT: PACK | ORAL | Status: DC

## 2013-12-27 NOTE — Discharge Instructions (Signed)
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    Sciatica with Rehab The sciatic nerve runs from the back down the leg and is responsible for sensation and control of the muscles in the back (posterior) side of the thigh, lower leg, and foot. Sciatica is a condition that is characterized by inflammation of this nerve.  SYMPTOMS   Signs of nerve damage, including numbness and/or weakness along the posterior side of the lower extremity.  Pain in the back of the thigh that may also travel down the leg.  Pain that worsens when sitting for long periods of time.  Occasionally, pain in the back or buttock. CAUSES  Inflammation of the sciatic nerve is the cause of sciatica. The inflammation is due to something irritating the nerve. Common sources of irritation include:  Sitting for long periods of time.  Direct trauma to the nerve.  Arthritis of the spine.  Herniated or ruptured disk.  Slipping of the vertebrae (spondylolisthesis).  Pressure from soft tissues, such as muscles or ligament-like tissue (fascia). RISK INCREASES WITH:  Sports that place pressure or stress on the spine (football or weightlifting).  Poor strength and flexibility.  Failure to warm up properly before activity.  Family history of low back pain or disk disorders.  Previous back injury or surgery.  Poor body mechanics, especially when lifting, or poor posture. PREVENTION   Warm up and stretch properly before activity.  Maintain physical fitness:  Strength, flexibility, and endurance.  Cardiovascular fitness.  Learn and use proper technique, especially with posture and lifting. When possible, have coach correct improper technique.  Avoid activities that place  stress on the spine. PROGNOSIS If treated properly, then sciatica usually resolves within 6 weeks. However, occasionally surgery is necessary.  RELATED COMPLICATIONS   Permanent nerve damage, including pain, numbness, tingle, or weakness.  Chronic back pain.  Risks of surgery: infection, bleeding, nerve damage, or damage to surrounding tissues. TREATMENT Treatment initially involves resting from any activities that aggravate your symptoms. The use of ice and medication may help reduce pain and inflammation. The use of strengthening and stretching exercises may help reduce pain with activity. These exercises may be performed at home or with referral to a therapist. A therapist may recommend further treatments, such as transcutaneous electronic nerve stimulation (TENS) or ultrasound. Your caregiver may recommend corticosteroid injections to help reduce inflammation of the sciatic nerve. If symptoms persist despite non-surgical (conservative) treatment, then surgery may be recommended. MEDICATION  If pain medication is necessary, then nonsteroidal anti-inflammatory medications, such as aspirin and ibuprofen, or other minor pain relievers, such as acetaminophen, are often recommended.  Do not take pain medication for 7 days before surgery.  Prescription pain relievers may be given if deemed necessary by your caregiver. Use only as directed and only as much as you need.  Ointments applied to the skin may be helpful.  Corticosteroid injections may be given by your caregiver. These injections should be reserved for the most serious cases, because they may only be given a certain number of times. HEAT AND COLD  Cold treatment (icing) relieves pain and reduces inflammation. Cold treatment should be applied for 10 to 15 minutes every 2 to 3 hours for inflammation and pain and immediately after any activity that aggravates your symptoms.  Use ice packs or massage the area with a piece of ice (ice  massage).  Heat treatment may be used prior to performing the stretching and strengthening activities prescribed by your caregiver, physical therapist, or athletic trainer. Use a heat pack or soak the injury in warm water. SEEK MEDICAL CARE IF:  Treatment seems to offer no benefit, or the condition worsens.  Any medications produce adverse side effects. EXERCISES  RANGE OF MOTION (ROM) AND STRETCHING EXERCISES - Sciatica Most people with sciatic will find that their symptoms worsen with either excessive bending forward (flexion) or arching at the low back (extension). The exercises which will help resolve your symptoms will focus on the opposite motion. Your physician, physical therapist or athletic trainer will help you determine which exercises will be most helpful to resolve your low back pain. Do not complete any exercises without first consulting with your clinician. Discontinue any exercises which worsen your symptoms until you speak to your clinician. If you have pain, numbness or tingling which travels down into your buttocks, leg or foot, the goal of the therapy is for these symptoms to move closer to your back and eventually resolve. Occasionally, these leg symptoms will get better, but your low back pain may worsen; this is typically an indication of progress in your rehabilitation. Be certain to be very alert to any changes in your symptoms and the activities in which you participated in the 24 hours prior to the change. Sharing this information with your clinician will allow him/her to most efficiently treat your condition. These exercises may help you when beginning to rehabilitate your injury. Your symptoms may resolve with or without further involvement from your physician, physical therapist or athletic trainer. While completing these exercises, remember:   Restoring tissue flexibility helps normal motion to return to the joints. This allows healthier, less painful movement and  activity.  An effective stretch should be held for at least 30 seconds.  A stretch should never be painful. You should only feel a gentle lengthening or release in the stretched tissue. FLEXION RANGE OF MOTION AND STRETCHING EXERCISES: STRETCH - Flexion, Single Knee to Chest   Lie on a firm bed or floor with both legs extended in front of you.  Keeping one leg in contact with the floor, bring your opposite knee to your chest. Hold your leg in place by either grabbing behind your thigh or at your knee.  Pull until you feel a gentle stretch in your low back. Hold __________ seconds.  Slowly release your grasp and repeat the exercise with the opposite side. Repeat __________ times. Complete this exercise __________ times per day.  STRETCH - Flexion, Double Knee to Chest  Lie on a firm bed or floor with both legs extended in front of you.  Keeping one leg in contact with the floor, bring your opposite knee to your chest.  Tense your stomach muscles to support your back and then lift your other knee to your chest. Hold your legs in place by either grabbing behind your thighs or at your knees.  Pull both knees toward your chest until you feel a gentle stretch in your low back. Hold __________ seconds.  Tense your stomach muscles and slowly return one leg at a time to the floor. Repeat __________ times. Complete this exercise __________ times per day.  STRETCH - Low Trunk Rotation   Lie on a firm bed or floor. Keeping your legs in front of you, bend your knees so they are  both pointed toward the ceiling and your feet are flat on the floor.  Extend your arms out to the side. This will stabilize your upper body by keeping your shoulders in contact with the floor.  Gently and slowly drop both knees together to one side until you feel a gentle stretch in your low back. Hold for __________ seconds.  Tense your stomach muscles to support your low back as you bring your knees back to the  starting position. Repeat the exercise to the other side. Repeat __________ times. Complete this exercise __________ times per day  EXTENSION RANGE OF MOTION AND FLEXIBILITY EXERCISES: STRETCH - Extension, Prone on Elbows  Lie on your stomach on the floor, a bed will be too soft. Place your palms about shoulder width apart and at the height of your head.  Place your elbows under your shoulders. If this is too painful, stack pillows under your chest.  Allow your body to relax so that your hips drop lower and make contact more completely with the floor.  Hold this position for __________ seconds.  Slowly return to lying flat on the floor. Repeat __________ times. Complete this exercise __________ times per day.  RANGE OF MOTION - Extension, Prone Press Ups  Lie on your stomach on the floor, a bed will be too soft. Place your palms about shoulder width apart and at the height of your head.  Keeping your back as relaxed as possible, slowly straighten your elbows while keeping your hips on the floor. You may adjust the placement of your hands to maximize your comfort. As you gain motion, your hands will come more underneath your shoulders.  Hold this position __________ seconds.  Slowly return to lying flat on the floor. Repeat __________ times. Complete this exercise __________ times per day.  STRENGTHENING EXERCISES - Sciatica  These exercises may help you when beginning to rehabilitate your injury. These exercises should be done near your "sweet spot." This is the neutral, low-back arch, somewhere between fully rounded and fully arched, that is your least painful position. When performed in this safe range of motion, these exercises can be used for people who have either a flexion or extension based injury. These exercises may resolve your symptoms with or without further involvement from your physician, physical therapist or athletic trainer. While completing these exercises, remember:    Muscles can gain both the endurance and the strength needed for everyday activities through controlled exercises.  Complete these exercises as instructed by your physician, physical therapist or athletic trainer. Progress with the resistance and repetition exercises only as your caregiver advises.  You may experience muscle soreness or fatigue, but the pain or discomfort you are trying to eliminate should never worsen during these exercises. If this pain does worsen, stop and make certain you are following the directions exactly. If the pain is still present after adjustments, discontinue the exercise until you can discuss the trouble with your clinician. STRENGTHENING - Deep Abdominals, Pelvic Tilt   Lie on a firm bed or floor. Keeping your legs in front of you, bend your knees so they are both pointed toward the ceiling and your feet are flat on the floor.  Tense your lower abdominal muscles to press your low back into the floor. This motion will rotate your pelvis so that your tail bone is scooping upwards rather than pointing at your feet or into the floor.  With a gentle tension and even breathing, hold this position for __________ seconds. Repeat  __________ times. Complete this exercise __________ times per day.  STRENGTHENING - Abdominals, Crunches   Lie on a firm bed or floor. Keeping your legs in front of you, bend your knees so they are both pointed toward the ceiling and your feet are flat on the floor. Cross your arms over your chest.  Slightly tip your chin down without bending your neck.  Tense your abdominals and slowly lift your trunk high enough to just clear your shoulder blades. Lifting higher can put excessive stress on the low back and does not further strengthen your abdominal muscles.  Control your return to the starting position. Repeat __________ times. Complete this exercise __________ times per day.  STRENGTHENING - Quadruped, Opposite UE/LE Lift  Assume a  hands and knees position on a firm surface. Keep your hands under your shoulders and your knees under your hips. You may place padding under your knees for comfort.  Find your neutral spine and gently tense your abdominal muscles so that you can maintain this position. Your shoulders and hips should form a rectangle that is parallel with the floor and is not twisted.  Keeping your trunk steady, lift your right hand no higher than your shoulder and then your left leg no higher than your hip. Make sure you are not holding your breath. Hold this position __________ seconds.  Continuing to keep your abdominal muscles tense and your back steady, slowly return to your starting position. Repeat with the opposite arm and leg. Repeat __________ times. Complete this exercise __________ times per day.  STRENGTHENING - Abdominals and Quadriceps, Straight Leg Raise   Lie on a firm bed or floor with both legs extended in front of you.  Keeping one leg in contact with the floor, bend the other knee so that your foot can rest flat on the floor.  Find your neutral spine, and tense your abdominal muscles to maintain your spinal position throughout the exercise.  Slowly lift your straight leg off the floor about 6 inches for a count of 15, making sure to not hold your breath.  Still keeping your neutral spine, slowly lower your leg all the way to the floor. Repeat this exercise with each leg __________ times. Complete this exercise __________ times per day. POSTURE AND BODY MECHANICS CONSIDERATIONS - Sciatica Keeping correct posture when sitting, standing or completing your activities will reduce the stress put on different body tissues, allowing injured tissues a chance to heal and limiting painful experiences. The following are general guidelines for improved posture. Your physician or physical therapist will provide you with any instructions specific to your needs. While reading these guidelines,  remember:  The exercises prescribed by your provider will help you have the flexibility and strength to maintain correct postures.  The correct posture provides the optimal environment for your joints to work. All of your joints have less wear and tear when properly supported by a spine with good posture. This means you will experience a healthier, less painful body.  Correct posture must be practiced with all of your activities, especially prolonged sitting and standing. Correct posture is as important when doing repetitive low-stress activities (typing) as it is when doing a single heavy-load activity (lifting). RESTING POSITIONS Consider which positions are most painful for you when choosing a resting position. If you have pain with flexion-based activities (sitting, bending, stooping, squatting), choose a position that allows you to rest in a less flexed posture. You would want to avoid curling into a fetal position  on your side. If your pain worsens with extension-based activities (prolonged standing, working overhead), avoid resting in an extended position such as sleeping on your stomach. Most people will find more comfort when they rest with their spine in a more neutral position, neither too rounded nor too arched. Lying on a non-sagging bed on your side with a pillow between your knees, or on your back with a pillow under your knees will often provide some relief. Keep in mind, being in any one position for a prolonged period of time, no matter how correct your posture, can still lead to stiffness. PROPER SITTING POSTURE In order to minimize stress and discomfort on your spine, you must sit with correct posture Sitting with good posture should be effortless for a healthy body. Returning to good posture is a gradual process. Many people can work toward this most comfortably by using various supports until they have the flexibility and strength to maintain this posture on their own. When sitting  with proper posture, your ears will fall over your shoulders and your shoulders will fall over your hips. You should use the back of the chair to support your upper back. Your low back will be in a neutral position, just slightly arched. You may place a small pillow or folded towel at the base of your low back for support.  When working at a desk, create an environment that supports good, upright posture. Without extra support, muscles fatigue and lead to excessive strain on joints and other tissues. Keep these recommendations in mind: CHAIR:   A chair should be able to slide under your desk when your back makes contact with the back of the chair. This allows you to work closely.  The chair's height should allow your eyes to be level with the upper part of your monitor and your hands to be slightly lower than your elbows. BODY POSITION  Your feet should make contact with the floor. If this is not possible, use a foot rest.  Keep your ears over your shoulders. This will reduce stress on your neck and low back. INCORRECT SITTING POSTURES   If you are feeling tired and unable to assume a healthy sitting posture, do not slouch or slump. This puts excessive strain on your back tissues, causing more damage and pain. Healthier options include:  Using more support, like a lumbar pillow.  Switching tasks to something that requires you to be upright or walking.  Talking a brief walk.  Lying down to rest in a neutral-spine position. PROLONGED STANDING WHILE SLIGHTLY LEANING FORWARD  When completing a task that requires you to lean forward while standing in one place for a long time, place either foot up on a stationary 2-4 inch high object to help maintain the best posture. When both feet are on the ground, the low back tends to lose its slight inward curve. If this curve flattens (or becomes too large), then the back and your other joints will experience too much stress, fatigue more quickly and can  cause pain.  CORRECT STANDING POSTURES Proper standing posture should be assumed with all daily activities, even if they only take a few moments, like when brushing your teeth. As in sitting, your ears should fall over your shoulders and your shoulders should fall over your hips. You should keep a slight tension in your abdominal muscles to brace your spine. Your tailbone should point down to the ground, not behind your body, resulting in an over-extended swayback posture.  INCORRECT STANDING POSTURES  Common incorrect standing postures include a forward head, locked knees and/or an excessive swayback. WALKING Walk with an upright posture. Your ears, shoulders and hips should all line-up. PROLONGED ACTIVITY IN A FLEXED POSITION When completing a task that requires you to bend forward at your waist or lean over a low surface, try to find a way to stabilize 3 of 4 of your limbs. You can place a hand or elbow on your thigh or rest a knee on the surface you are reaching across. This will provide you more stability so that your muscles do not fatigue as quickly. By keeping your knees relaxed, or slightly bent, you will also reduce stress across your low back. CORRECT LIFTING TECHNIQUES DO :   Assume a wide stance. This will provide you more stability and the opportunity to get as close as possible to the object which you are lifting.  Tense your abdominals to brace your spine; then bend at the knees and hips. Keeping your back locked in a neutral-spine position, lift using your leg muscles. Lift with your legs, keeping your back straight.  Test the weight of unknown objects before attempting to lift them.  Try to keep your elbows locked down at your sides in order get the best strength from your shoulders when carrying an object.  Always ask for help when lifting heavy or awkward objects. INCORRECT LIFTING TECHNIQUES DO NOT:   Lock your knees when lifting, even if it is a small object.  Bend and  twist. Pivot at your feet or move your feet when needing to change directions.  Assume that you cannot safely pick up a paperclip without proper posture. Document Released: 12/21/2004 Document Revised: 05/07/2013 Document Reviewed: 04/04/2008 [facility] Patient Information 2015 [place], [place]. This information is not intended to replace advice given to you by your health care provider. Make sure you discuss any questions you have with your health care provider.

## 2013-12-27 NOTE — ED Provider Notes (Addendum)
CSN: [id number]     Arrival date & time 12/27/13  0820 History   First MD Initiated Contact with Patient 12/27/13 0850     No chief complaint on file.  (Consider location/radiation/quality/duration/timing/severity/associated sxs/prior Treatment) HPI  L hip pain: Pt started to limp on Monday w/ mild pain. Pain was sharp and achy in nature. No change in routine lately or h/o injury. Localized to one spot. No change. Aleve Q12 Advil 600 Q6 w/ some improvement. Worse at night w/ sleep and climbing stairs. No radiation. No loss of strength or sensation   Past Medical History  Diagnosis Date  . Attention deficit disorder   . Diverticular disease     [facility]  . BPH (benign prostatic hyperplasia)   . Depression with anxiety   . Varicose veins   . Wears glasses    Past Surgical History  Procedure Laterality Date  . Wisdom tooth extraction    . Colonoscopy    . Inguinal hernia repair Right 04/30/2013    Procedure: HERNIA REPAIR INGUINAL ADULT;  Surgeon: Rolm Bookbinder, MD;  Location: [facility];  Service: General;  Laterality: Right;   No family history on file. History  Substance Use Topics  . Smoking status: Never Smoker   . Smokeless tobacco: Not on file  . Alcohol Use: Yes    Review of Systems Per HPI with all other pertinent systems negative.   Allergies  Lincomycin hcl  Home Medications   Prior to Admission medications   Medication Sig Start Date End Date Taking? Authorizing Provider  aspirin EC 81 MG tablet Take 81 mg by mouth daily.    Historical Provider, MD  Desvenlafaxine Succinate (PRISTIQ PO) Take 1 tablet by mouth daily.    Historical Provider, MD  lithium carbonate 150 MG capsule Take 150 mg by mouth 3 (three) times daily with meals.    Historical Provider, MD  methocarbamol (ROBAXIN) 500 MG tablet Take 1-2 tablets (500-1,000 mg total) by mouth every 6 (six) hours as needed for muscle spasms. 12/27/13   Brian Dickens, MD  PredniSONE 10 MG  KIT 12 day dose pack 12/27/13   Brian Dickens, MD  zolpidem (AMBIEN) 10 MG tablet Take 10 mg by mouth at bedtime as needed for sleep.    Historical Provider, MD   BP 121/69 mmHg  Pulse 51  Temp(Src) 98.3 F (36.8 C) (Oral)  Resp 18  SpO2 100% Physical Exam  Constitutional: He appears well-developed and well-nourished. No distress.  HENT:  Head: Normocephalic and atraumatic.  Eyes: Conjunctivae and EOM are normal. Pupils are equal, round, and reactive to light.  Neck: Normal range of motion.  Cardiovascular: Normal rate, normal heart sounds and intact distal pulses.   No murmur heard. Pulmonary/Chest: Effort normal and breath sounds normal.  Abdominal: Soft. Bowel sounds are normal.  Musculoskeletal:  FROM Straight leg raise.  FABERs + on L Abduction, adduction, flexion hip nml.  Pain w/ raising from seated position  Skin: He is not diaphoretic.    ED Course  Procedures (including critical care time) Labs Review Labs Reviewed - No data to display  Imaging Review No results found.   MDM   1. Pyriformis syndrome, left   2. SI (sacroiliac) joint inflammation    No sign of arthritis or sciatica Exercises Steroids Robaxin Heat and massage W/u 2 weeks w/ pcp Precautions given and all questions answered  Linna Darner, MD Family Medicine 12/27/2013, 9:26 AM      Brian Guerrero  Marily Memos, MD 12/27/13 [place], MD 12/27/13 [phone]

## 2014-02-11 ENCOUNTER — Ambulatory Visit: Payer: 59 | Attending: Orthopedic Surgery | Admitting: Physical Therapy

## 2014-02-11 DIAGNOSIS — M545 Low back pain: Secondary | ICD-10-CM | POA: Diagnosis not present

## 2014-02-13 ENCOUNTER — Ambulatory Visit: Payer: 59 | Admitting: Physical Therapy

## 2015-01-09 DIAGNOSIS — F3132 Bipolar disorder, current episode depressed, moderate: Secondary | ICD-10-CM | POA: Diagnosis not present

## 2015-01-10 DIAGNOSIS — F39 Unspecified mood [affective] disorder: Secondary | ICD-10-CM | POA: Diagnosis not present

## 2015-02-04 DIAGNOSIS — F3132 Bipolar disorder, current episode depressed, moderate: Secondary | ICD-10-CM | POA: Diagnosis not present

## 2015-02-13 DIAGNOSIS — F39 Unspecified mood [affective] disorder: Secondary | ICD-10-CM | POA: Diagnosis not present

## 2015-03-03 DIAGNOSIS — D075 Carcinoma in situ of prostate: Secondary | ICD-10-CM | POA: Diagnosis not present

## 2015-03-03 DIAGNOSIS — R972 Elevated prostate specific antigen [PSA]: Secondary | ICD-10-CM | POA: Diagnosis not present

## 2015-03-06 DIAGNOSIS — Z0000 Encounter for general adult medical examination without abnormal findings: Secondary | ICD-10-CM | POA: Diagnosis not present

## 2015-03-06 DIAGNOSIS — Z13220 Encounter for screening for lipoid disorders: Secondary | ICD-10-CM | POA: Diagnosis not present

## 2015-03-06 DIAGNOSIS — R972 Elevated prostate specific antigen [PSA]: Secondary | ICD-10-CM | POA: Diagnosis not present

## 2015-03-06 DIAGNOSIS — F3181 Bipolar II disorder: Secondary | ICD-10-CM | POA: Diagnosis not present

## 2015-03-06 DIAGNOSIS — Z1389 Encounter for screening for other disorder: Secondary | ICD-10-CM | POA: Diagnosis not present

## 2015-03-06 DIAGNOSIS — Z23 Encounter for immunization: Secondary | ICD-10-CM | POA: Diagnosis not present

## 2015-03-10 DIAGNOSIS — Z13220 Encounter for screening for lipoid disorders: Secondary | ICD-10-CM | POA: Diagnosis not present

## 2015-03-10 DIAGNOSIS — Z0000 Encounter for general adult medical examination without abnormal findings: Secondary | ICD-10-CM | POA: Diagnosis not present

## 2015-03-13 ENCOUNTER — Encounter: Payer: Self-pay | Admitting: Gastroenterology

## 2015-04-01 DIAGNOSIS — F3132 Bipolar disorder, current episode depressed, moderate: Secondary | ICD-10-CM | POA: Diagnosis not present

## 2015-04-04 DIAGNOSIS — F39 Unspecified mood [affective] disorder: Secondary | ICD-10-CM | POA: Diagnosis not present

## 2015-04-17 DIAGNOSIS — Z79899 Other long term (current) drug therapy: Secondary | ICD-10-CM | POA: Diagnosis not present

## 2015-04-17 DIAGNOSIS — F3132 Bipolar disorder, current episode depressed, moderate: Secondary | ICD-10-CM | POA: Diagnosis not present

## 2015-05-08 DIAGNOSIS — F39 Unspecified mood [affective] disorder: Secondary | ICD-10-CM | POA: Diagnosis not present

## 2015-05-13 DIAGNOSIS — F3132 Bipolar disorder, current episode depressed, moderate: Secondary | ICD-10-CM | POA: Diagnosis not present

## 2015-06-24 DIAGNOSIS — F39 Unspecified mood [affective] disorder: Secondary | ICD-10-CM | POA: Diagnosis not present

## 2015-07-02 DIAGNOSIS — F3132 Bipolar disorder, current episode depressed, moderate: Secondary | ICD-10-CM | POA: Diagnosis not present

## 2015-08-11 DIAGNOSIS — R972 Elevated prostate specific antigen [PSA]: Secondary | ICD-10-CM | POA: Diagnosis not present

## 2015-08-19 DIAGNOSIS — G250 Essential tremor: Secondary | ICD-10-CM | POA: Diagnosis not present

## 2015-09-25 DIAGNOSIS — F39 Unspecified mood [affective] disorder: Secondary | ICD-10-CM | POA: Diagnosis not present

## 2015-09-26 DIAGNOSIS — R972 Elevated prostate specific antigen [PSA]: Secondary | ICD-10-CM | POA: Diagnosis not present

## 2015-09-26 DIAGNOSIS — E291 Testicular hypofunction: Secondary | ICD-10-CM | POA: Diagnosis not present

## 2015-10-15 DIAGNOSIS — F3132 Bipolar disorder, current episode depressed, moderate: Secondary | ICD-10-CM | POA: Diagnosis not present

## 2015-11-05 DIAGNOSIS — F3132 Bipolar disorder, current episode depressed, moderate: Secondary | ICD-10-CM | POA: Diagnosis not present

## 2015-11-06 DIAGNOSIS — B351 Tinea unguium: Secondary | ICD-10-CM | POA: Diagnosis not present

## 2015-11-13 DIAGNOSIS — C44311 Basal cell carcinoma of skin of nose: Secondary | ICD-10-CM | POA: Diagnosis not present

## 2015-11-13 DIAGNOSIS — D224 Melanocytic nevi of scalp and neck: Secondary | ICD-10-CM | POA: Diagnosis not present

## 2015-11-13 DIAGNOSIS — L570 Actinic keratosis: Secondary | ICD-10-CM | POA: Diagnosis not present

## 2015-11-13 DIAGNOSIS — D225 Melanocytic nevi of trunk: Secondary | ICD-10-CM | POA: Diagnosis not present

## 2015-11-13 DIAGNOSIS — L821 Other seborrheic keratosis: Secondary | ICD-10-CM | POA: Diagnosis not present

## 2015-11-13 DIAGNOSIS — L814 Other melanin hyperpigmentation: Secondary | ICD-10-CM | POA: Diagnosis not present

## 2015-11-21 DIAGNOSIS — H40023 Open angle with borderline findings, high risk, bilateral: Secondary | ICD-10-CM | POA: Diagnosis not present

## 2015-11-21 DIAGNOSIS — H524 Presbyopia: Secondary | ICD-10-CM | POA: Diagnosis not present

## 2015-11-21 DIAGNOSIS — H52223 Regular astigmatism, bilateral: Secondary | ICD-10-CM | POA: Diagnosis not present

## 2015-11-21 DIAGNOSIS — H5213 Myopia, bilateral: Secondary | ICD-10-CM | POA: Diagnosis not present

## 2015-12-03 DIAGNOSIS — F39 Unspecified mood [affective] disorder: Secondary | ICD-10-CM | POA: Diagnosis not present

## 2015-12-04 DIAGNOSIS — H40023 Open angle with borderline findings, high risk, bilateral: Secondary | ICD-10-CM | POA: Diagnosis not present

## 2015-12-09 DIAGNOSIS — F3132 Bipolar disorder, current episode depressed, moderate: Secondary | ICD-10-CM | POA: Diagnosis not present

## 2015-12-11 DIAGNOSIS — C44311 Basal cell carcinoma of skin of nose: Secondary | ICD-10-CM | POA: Diagnosis not present

## 2015-12-11 DIAGNOSIS — Z85828 Personal history of other malignant neoplasm of skin: Secondary | ICD-10-CM | POA: Diagnosis not present

## 2015-12-22 DIAGNOSIS — F39 Unspecified mood [affective] disorder: Secondary | ICD-10-CM | POA: Diagnosis not present

## 2016-01-29 DIAGNOSIS — F3132 Bipolar disorder, current episode depressed, moderate: Secondary | ICD-10-CM | POA: Diagnosis not present

## 2016-01-29 DIAGNOSIS — F39 Unspecified mood [affective] disorder: Secondary | ICD-10-CM | POA: Diagnosis not present

## 2016-02-17 DIAGNOSIS — F3132 Bipolar disorder, current episode depressed, moderate: Secondary | ICD-10-CM | POA: Diagnosis not present

## 2016-03-05 DIAGNOSIS — F3132 Bipolar disorder, current episode depressed, moderate: Secondary | ICD-10-CM | POA: Diagnosis not present

## 2016-03-09 DIAGNOSIS — G250 Essential tremor: Secondary | ICD-10-CM | POA: Diagnosis not present

## 2016-03-09 DIAGNOSIS — R972 Elevated prostate specific antigen [PSA]: Secondary | ICD-10-CM | POA: Diagnosis not present

## 2016-03-09 DIAGNOSIS — Z23 Encounter for immunization: Secondary | ICD-10-CM | POA: Diagnosis not present

## 2016-03-09 DIAGNOSIS — Z1389 Encounter for screening for other disorder: Secondary | ICD-10-CM | POA: Diagnosis not present

## 2016-03-09 DIAGNOSIS — Z0000 Encounter for general adult medical examination without abnormal findings: Secondary | ICD-10-CM | POA: Diagnosis not present

## 2016-03-09 DIAGNOSIS — F3181 Bipolar II disorder: Secondary | ICD-10-CM | POA: Diagnosis not present

## 2016-03-09 DIAGNOSIS — N400 Enlarged prostate without lower urinary tract symptoms: Secondary | ICD-10-CM | POA: Diagnosis not present

## 2016-03-17 DIAGNOSIS — F3132 Bipolar disorder, current episode depressed, moderate: Secondary | ICD-10-CM | POA: Diagnosis not present

## 2016-03-26 DIAGNOSIS — F39 Unspecified mood [affective] disorder: Secondary | ICD-10-CM | POA: Diagnosis not present

## 2016-04-14 DIAGNOSIS — F3132 Bipolar disorder, current episode depressed, moderate: Secondary | ICD-10-CM | POA: Diagnosis not present

## 2016-05-12 DIAGNOSIS — F3132 Bipolar disorder, current episode depressed, moderate: Secondary | ICD-10-CM | POA: Diagnosis not present

## 2016-06-10 DIAGNOSIS — F3132 Bipolar disorder, current episode depressed, moderate: Secondary | ICD-10-CM | POA: Diagnosis not present

## 2016-08-25 DIAGNOSIS — F3132 Bipolar disorder, current episode depressed, moderate: Secondary | ICD-10-CM | POA: Diagnosis not present

## 2016-10-14 DIAGNOSIS — F3132 Bipolar disorder, current episode depressed, moderate: Secondary | ICD-10-CM | POA: Diagnosis not present

## 2016-11-12 DIAGNOSIS — F3132 Bipolar disorder, current episode depressed, moderate: Secondary | ICD-10-CM | POA: Diagnosis not present

## 2016-11-19 ENCOUNTER — Encounter: Payer: Self-pay | Admitting: Gastroenterology

## 2016-11-26 ENCOUNTER — Ambulatory Visit (HOSPITAL_COMMUNITY)
Admission: RE | Admit: 2016-11-26 | Discharge: 2016-11-26 | Disposition: A | Discharge status: Home / Self Care | Payer: 59 | Source: Ambulatory Visit | Attending: Psychiatry | Admitting: Psychiatry

## 2016-11-26 DIAGNOSIS — R002 Palpitations: Secondary | ICD-10-CM | POA: Insufficient documentation

## 2016-11-26 DIAGNOSIS — F314 Bipolar disorder, current episode depressed, severe, without psychotic features: Secondary | ICD-10-CM | POA: Diagnosis not present

## 2016-12-07 DIAGNOSIS — R972 Elevated prostate specific antigen [PSA]: Secondary | ICD-10-CM | POA: Diagnosis not present

## 2016-12-07 DIAGNOSIS — F3132 Bipolar disorder, current episode depressed, moderate: Secondary | ICD-10-CM | POA: Diagnosis not present

## 2017-01-05 DIAGNOSIS — F3132 Bipolar disorder, current episode depressed, moderate: Secondary | ICD-10-CM | POA: Diagnosis not present

## 2017-01-26 DIAGNOSIS — R6882 Decreased libido: Secondary | ICD-10-CM | POA: Diagnosis not present

## 2017-01-26 DIAGNOSIS — R972 Elevated prostate specific antigen [PSA]: Secondary | ICD-10-CM | POA: Diagnosis not present

## 2017-02-07 DIAGNOSIS — Z79899 Other long term (current) drug therapy: Secondary | ICD-10-CM | POA: Diagnosis not present

## 2017-02-07 DIAGNOSIS — F3175 Bipolar disorder, in partial remission, most recent episode depressed: Secondary | ICD-10-CM | POA: Diagnosis not present

## 2017-02-08 DIAGNOSIS — F3132 Bipolar disorder, current episode depressed, moderate: Secondary | ICD-10-CM | POA: Diagnosis not present

## 2017-03-14 DIAGNOSIS — F3132 Bipolar disorder, current episode depressed, moderate: Secondary | ICD-10-CM | POA: Diagnosis not present

## 2017-03-16 DIAGNOSIS — Z1389 Encounter for screening for other disorder: Secondary | ICD-10-CM | POA: Diagnosis not present

## 2017-03-16 DIAGNOSIS — N400 Enlarged prostate without lower urinary tract symptoms: Secondary | ICD-10-CM | POA: Diagnosis not present

## 2017-03-16 DIAGNOSIS — F3181 Bipolar II disorder: Secondary | ICD-10-CM | POA: Diagnosis not present

## 2017-03-16 DIAGNOSIS — R972 Elevated prostate specific antigen [PSA]: Secondary | ICD-10-CM | POA: Diagnosis not present

## 2017-03-16 DIAGNOSIS — Z13220 Encounter for screening for lipoid disorders: Secondary | ICD-10-CM | POA: Diagnosis not present

## 2017-03-16 DIAGNOSIS — Z6824 Body mass index (BMI) 24.0-24.9, adult: Secondary | ICD-10-CM | POA: Diagnosis not present

## 2017-03-16 DIAGNOSIS — Z0000 Encounter for general adult medical examination without abnormal findings: Secondary | ICD-10-CM | POA: Diagnosis not present

## 2017-03-16 DIAGNOSIS — Z1211 Encounter for screening for malignant neoplasm of colon: Secondary | ICD-10-CM | POA: Diagnosis not present

## 2017-03-16 DIAGNOSIS — G250 Essential tremor: Secondary | ICD-10-CM | POA: Diagnosis not present

## 2017-03-24 DIAGNOSIS — G250 Essential tremor: Secondary | ICD-10-CM | POA: Diagnosis not present

## 2017-03-24 DIAGNOSIS — Z13220 Encounter for screening for lipoid disorders: Secondary | ICD-10-CM | POA: Diagnosis not present

## 2017-04-06 DIAGNOSIS — H5213 Myopia, bilateral: Secondary | ICD-10-CM | POA: Diagnosis not present

## 2017-04-06 DIAGNOSIS — H52223 Regular astigmatism, bilateral: Secondary | ICD-10-CM | POA: Diagnosis not present

## 2017-04-06 DIAGNOSIS — H524 Presbyopia: Secondary | ICD-10-CM | POA: Diagnosis not present

## 2017-06-20 DIAGNOSIS — F3132 Bipolar disorder, current episode depressed, moderate: Secondary | ICD-10-CM | POA: Diagnosis not present

## 2017-09-26 ENCOUNTER — Encounter: Payer: Self-pay | Admitting: Gastroenterology

## 2017-10-17 ENCOUNTER — Telehealth: Payer: Self-pay

## 2017-10-17 NOTE — Telephone Encounter (Signed)
Left message for pt on cell number to call back to reschedule his pre-visit. Pt was a no show for today. Gwyndolyn Saxon

## 2017-10-19 ENCOUNTER — Ambulatory Visit (AMBULATORY_SURGERY_CENTER): Payer: Self-pay

## 2017-10-19 ENCOUNTER — Encounter: Payer: Self-pay | Admitting: Gastroenterology

## 2017-10-19 VITALS — Ht 69.0 in | Wt 157.6 lb

## 2017-10-19 DIAGNOSIS — Z1211 Encounter for screening for malignant neoplasm of colon: Secondary | ICD-10-CM

## 2017-10-19 DIAGNOSIS — F988 Other specified behavioral and emotional disorders with onset usually occurring in childhood and adolescence: Secondary | ICD-10-CM

## 2017-10-19 HISTORY — DX: Other specified behavioral and emotional disorders with onset usually occurring in childhood and adolescence: F98.8

## 2017-10-19 MED ORDER — NA SULFATE-K SULFATE-MG SULF 17.5-3.13-1.6 GM/177ML PO SOLN: 1.0000 | Freq: Once | ORAL | 0 refills | Status: AC

## 2017-10-19 NOTE — Progress Notes (Signed)
Per pt, no allergies to soy or egg products.Pt not taking any weight loss meds or using  O2 at home.  Pt refused emmi video. 

## 2017-10-23 ENCOUNTER — Encounter: Payer: Self-pay | Admitting: Emergency Medicine

## 2017-10-23 DIAGNOSIS — F411 Generalized anxiety disorder: Secondary | ICD-10-CM

## 2017-10-23 HISTORY — DX: Generalized anxiety disorder: F41.1

## 2017-11-01 ENCOUNTER — Encounter: Payer: Self-pay | Admitting: Gastroenterology

## 2017-11-01 ENCOUNTER — Ambulatory Visit (AMBULATORY_SURGERY_CENTER): Payer: 59 | Admitting: Gastroenterology

## 2017-11-01 VITALS — BP 114/62 | HR 67 | Temp 98.4°F | Resp 19 | Ht 69.0 in | Wt 157.0 lb

## 2017-11-01 DIAGNOSIS — D122 Benign neoplasm of ascending colon: Secondary | ICD-10-CM

## 2017-11-01 DIAGNOSIS — D120 Benign neoplasm of cecum: Secondary | ICD-10-CM | POA: Diagnosis not present

## 2017-11-01 DIAGNOSIS — D123 Benign neoplasm of transverse colon: Secondary | ICD-10-CM | POA: Diagnosis not present

## 2017-11-01 DIAGNOSIS — Z1211 Encounter for screening for malignant neoplasm of colon: Secondary | ICD-10-CM

## 2017-11-01 DIAGNOSIS — Z86010 Personal history of colonic polyps: Secondary | ICD-10-CM | POA: Diagnosis not present

## 2017-11-01 DIAGNOSIS — F319 Bipolar disorder, unspecified: Secondary | ICD-10-CM | POA: Diagnosis not present

## 2017-11-01 MED ORDER — SODIUM CHLORIDE 0.9 % IV SOLN: 500.0000 mL | Freq: Once | INTRAVENOUS | Status: DC

## 2017-11-01 NOTE — Op Note (Signed)
[place] Patient Name: Brian Guerrero Procedure Date: 11/01/2017 2:06 PM MRN: [id number] Endoscopist: Mauri Pole , MD Age: 67 Referring MD:  Date of Birth: 1950-05-30 Gender: Male Account #: [id number] Procedure:                Colonoscopy Indications:              Screening for colorectal malignant neoplasm, Last                            colonoscopy: 2008 Medicines:                Monitored Anesthesia Care Procedure:                Pre-Anesthesia Assessment:                           - Prior to the procedure, a History and Physical                            was performed, and patient medications and                            allergies were reviewed. The patient's tolerance of                            previous anesthesia was also reviewed. The risks                            and benefits of the procedure and the sedation                            options and risks were discussed with the patient.                            All questions were answered, and informed consent                            was obtained. Prior Anticoagulants: The patient has                            taken no previous anticoagulant or antiplatelet                            agents. ASA Grade Assessment: II - A patient with                            mild systemic disease. After reviewing the risks                            and benefits, the patient was deemed in                            satisfactory condition to undergo the procedure.  After obtaining informed consent, the colonoscope                            was passed under direct vision. Throughout the                            procedure, the patient's blood pressure, pulse, and                            oxygen saturations were monitored continuously. The                            Colonoscope was introduced through the anus and                            advanced to the the cecum, identified by                             appendiceal orifice and ileocecal valve. The                            colonoscopy was performed without difficulty. The                            patient tolerated the procedure well. The quality                            of the bowel preparation was good. The ileocecal                            valve, appendiceal orifice, and rectum were                            photographed. Scope In: 2:23:42 PM Scope Out: 2:57:12 PM Scope Withdrawal Time: 0 hours 17 minutes 41 seconds  Total Procedure Duration: 0 hours 33 minutes 30 seconds  Findings:                 The perianal and digital rectal examinations were                            normal.                           Two sessile polyps were found in the transverse                            colon and cecum. The polyps were 1 to 3 mm in size.                            These polyps were removed with a cold biopsy                            forceps. Resection and retrieval were complete.  Scattered small and large-mouthed diverticula were                            found in the sigmoid colon and descending colon.                           Non-bleeding internal hemorrhoids were found during                            retroflexion. The hemorrhoids were small. Complications:            No immediate complications. Estimated Blood Loss:     Estimated blood loss was minimal. Impression:               - Two 1 to 3 mm polyps in the transverse colon and                            in the cecum, removed with a cold biopsy forceps.                            Resected and retrieved.                           - Severe diverticulosis in the sigmoid colon and in                            the descending colon.                           - Non-bleeding internal hemorrhoids. Recommendation:           - Patient has a contact number available for                            emergencies. The signs and  symptoms of potential                            delayed complications were discussed with the                            patient. Return to normal activities tomorrow.                            Written discharge instructions were provided to the                            patient.                           - Resume previous diet.                           - Continue present medications.                           - Await pathology results.                           -  Repeat colonoscopy in 5-10 years for surveillance                            based on pathology results. Mauri Pole, MD 11/01/2017 3:01:35 PM This report has been signed electronically.

## 2017-11-01 NOTE — Progress Notes (Signed)
Report given to PACU, vss 

## 2017-11-01 NOTE — Progress Notes (Signed)
Pt's states no medical or surgical changes since previsit or office visit. 

## 2017-11-01 NOTE — Progress Notes (Signed)
Called to room to assist during endoscopic procedure.  Patient ID and intended procedure confirmed with present staff. Received instructions for my participation in the procedure from the performing physician.  

## 2017-11-01 NOTE — Patient Instructions (Signed)
Handouts given on polyps, diverticulosis and hemorrhoids. 2 polyps removed today.    YOU HAD AN ENDOSCOPIC PROCEDURE TODAY AT [facility]:   Refer to the procedure report that was given to you for any specific questions about what was found during the examination.  If the procedure report does not answer your questions, please call your gastroenterologist to clarify.  If you requested that your care partner not be given the details of your procedure findings, then the procedure report has been included in a sealed envelope for you to review at your convenience later.  YOU SHOULD EXPECT: Some feelings of bloating in the abdomen. Passage of more gas than usual.  Walking can help get rid of the air that was put into your GI tract during the procedure and reduce the bloating. If you had a lower endoscopy (such as a colonoscopy or flexible sigmoidoscopy) you may notice spotting of blood in your stool or on the toilet paper. If you underwent a bowel prep for your procedure, you may not have a normal bowel movement for a few days.  Please Note:  You might notice some irritation and congestion in your nose or some drainage.  This is from the oxygen used during your procedure.  There is no need for concern and it should clear up in a day or so.  SYMPTOMS TO REPORT IMMEDIATELY:   Following lower endoscopy (colonoscopy or flexible sigmoidoscopy):  Excessive amounts of blood in the stool  Significant tenderness or worsening of abdominal pains  Swelling of the abdomen that is new, acute  Fever of 100F or higher  For urgent or emergent issues, a gastroenterologist can be reached at any hour by calling [phone].   DIET:  We do recommend a small meal at first, but then you may proceed to your regular diet.  Drink plenty of fluids but you should avoid alcoholic beverages for 24 hours.  ACTIVITY:  You should plan to take it easy for the rest of today and you should NOT DRIVE or use  heavy machinery until tomorrow (because of the sedation medicines used during the test).    FOLLOW UP: Our staff will call the number listed on your records the next business day following your procedure to check on you and address any questions or concerns that you may have regarding the information given to you following your procedure. If we do not reach you, we will leave a message.  However, if you are feeling well and you are not experiencing any problems, there is no need to return our call.  We will assume that you have returned to your regular daily activities without incident.  If any biopsies were taken you will be contacted by phone or by letter within the next 1-3 weeks.  Please call us at [phone] if you have not heard about the biopsies in 3 weeks.    SIGNATURES/CONFIDENTIALITY: You and/or your care partner have signed paperwork which will be entered into your electronic medical record.  These signatures attest to the fact that that the information above on your After Visit Summary has been reviewed and is understood.  Full responsibility of the confidentiality of this discharge information lies with you and/or your care-partner.

## 2017-11-02 ENCOUNTER — Telehealth: Payer: Self-pay | Admitting: *Deleted

## 2017-11-02 ENCOUNTER — Ambulatory Visit: Payer: 59 | Admitting: Psychiatry

## 2017-11-02 ENCOUNTER — Encounter: Payer: Self-pay | Admitting: Psychiatry

## 2017-11-02 DIAGNOSIS — F411 Generalized anxiety disorder: Secondary | ICD-10-CM | POA: Diagnosis not present

## 2017-11-02 DIAGNOSIS — F319 Bipolar disorder, unspecified: Secondary | ICD-10-CM

## 2017-11-02 DIAGNOSIS — R7989 Other specified abnormal findings of blood chemistry: Secondary | ICD-10-CM

## 2017-11-02 MED ORDER — BUPROPION HCL ER (XL) 150 MG PO TB24: 450.0000 mg | ORAL_TABLET | Freq: Every day | ORAL | 3 refills | Status: DC

## 2017-11-02 NOTE — Telephone Encounter (Signed)
Attempted follow up call.  Left message.  Will call again this afternoon.  

## 2017-11-02 NOTE — Telephone Encounter (Signed)
Phone line busy x 2 this afternoon. Unable to contact the patient for follow up.

## 2017-11-02 NOTE — Progress Notes (Signed)
LEILAN BOCHENEK [id number] 08-10-1950 67 y.o.  Subjective:   Patient ID:  Brian Guerrero is a 67 y.o. (DOB July 01, 1950) male.  Chief Complaint:  Chief Complaint  Patient presents with  . Depression  . Anxiety    HPI Brian Guerrero presents to the office today for follow-up of bipolar and GAD. Overall mood stable.  Feels a little clumsy in am when he gets OOB very briefly. Patient reports stable mood and no irritable moods. Does still struggle with inertia and some avoidance.   Patient denies any recent difficulty with anxiety.  Patient denies difficulty with sleep initiation or maintenance. Denies appetite disturbance.  Patient reports that energy and motivation have been good.  Patient denies any difficulty with concentration.  Patient denies any suicidal ideation.  Review of Systems:  Review of Systems  Neurological: Positive for tremors. Negative for weakness.  Psychiatric/Behavioral: Negative for agitation, behavioral problems, confusion, decreased concentration, dysphoric mood, hallucinations, self-injury, sleep disturbance and suicidal ideas. The patient is not nervous/anxious and is not hyperactive.   Tremor is better not gone.  Medications: I have reviewed the patient's current medications.  Current Outpatient Medications  Medication Sig Dispense Refill  . aspirin EC 81 MG tablet Take 81 mg by mouth daily.    . finasteride (PROSCAR) 5 MG tablet Take 5 mg by mouth daily.    [name] lithium carbonate 300 MG capsule Take 300 mg by mouth 2 (two) times daily with a meal.     . propranolol (INDERAL) 20 MG tablet Take 20 mg by mouth daily.    . QUEtiapine (SEROQUEL) 400 MG tablet Take 600 mg by mouth at bedtime.     [name] buPROPion (WELLBUTRIN XL) 150 MG 24 hr tablet Take 3 tablets (450 mg total) by mouth daily. 90 tablet 3   No current facility-administered medications for this visit.     Medication Side Effects: Other: mild tremor and clumsy a little No falls  Allergies:  Allergies   Allergen Reactions  . Lincomycin Hcl     As a child/had a hive    Past Medical History:  Diagnosis Date  . Attention deficit disorder 10/19/2017   per pt/ not aware of having ADD  . BPH (benign prostatic hyperplasia)   . Depression with anxiety   . Diverticular disease    [facility]/per pt not aware of this  . Varicose veins   . Wears glasses     Family History  Problem Relation Age of Onset  . Depression Mother   . Prostate cancer Father   . Kidney disease Father     Social History   Socioeconomic History  . Marital status: Married    Spouse name: Not on file  . Number of children: Not on file  . Years of education: Not on file  . Highest education level: Not on file  Occupational History  . Not on file  Social Needs  . Financial resource strain: Not on file  . Food insecurity:    Worry: Not on file    Inability: Not on file  . Transportation needs:    Medical: Not on file    Non-medical: Not on file  Tobacco Use  . Smoking status: Never Smoker  . Smokeless tobacco: Never Used  Substance and Sexual Activity  . Alcohol use: Yes    Alcohol/week: 2.0 standard drinks    Types: 2 Standard drinks or equivalent per week  . Drug use: Not Currently  . Sexual activity: Not on  file  Lifestyle  . Physical activity:    Days per week: Not on file    Minutes per session: Not on file  . Stress: Not on file  Relationships  . Social connections:    Talks on phone: Not on file    Gets together: Not on file    Attends religious service: Not on file    Active member of club or organization: Not on file    Attends meetings of clubs or organizations: Not on file    Relationship status: Not on file  . Intimate partner violence:    Fear of current or ex partner: Not on file    Emotionally abused: Not on file    Physically abused: Not on file    Forced sexual activity: Not on file  Other Topics Concern  . Not on file  Social History Narrative  . Not on file     Past Medical History, Surgical history, Social history, and Family history were reviewed and updated as appropriate.   Please see review of systems for further details on the patient's review from today.   Objective:   Physical Exam:  There were no vitals taken for this visit.  Physical Exam  Constitutional: He is oriented to person, place, and time. He appears well-developed. No distress.  Musculoskeletal: He exhibits no deformity.  Neurological: He is alert and oriented to person, place, and time. He displays tremor. He displays no atrophy. Coordination and gait normal.  Psychiatric: His speech is normal and behavior is normal. Judgment and thought content normal. His mood appears not anxious. His affect is blunt. His affect is not angry, not labile and not inappropriate. Cognition and memory are normal. He does not exhibit a depressed mood. He expresses no homicidal and no suicidal ideation. He expresses no suicidal plans and no homicidal plans.  Insight intact. No auditory or visual hallucinations. No delusions.     Lab Review:     Component Value Date/Time   NA 140 04/26/2013 0930   K 4.7 04/26/2013 0930   CL 105 04/26/2013 0930   CO2 25 04/26/2013 0930   GLUCOSE 93 04/26/2013 0930   BUN 21 04/26/2013 0930   CREATININE 0.85 04/26/2013 0930   CALCIUM 9.1 04/26/2013 0930   GFRNONAA >90 04/26/2013 0930   GFRAA >90 04/26/2013 0930       Component Value Date/Time   WBC 6.4 04/26/2013 0930   RBC 4.69 04/26/2013 0930   HGB 14.6 04/26/2013 0930   HCT 42.3 04/26/2013 0930   PLT 266 04/26/2013 0930   MCV 90.2 04/26/2013 0930   MCH 31.1 04/26/2013 0930   MCHC 34.5 04/26/2013 0930   RDW 13.4 04/26/2013 0930   LYMPHSABS 1.2 04/26/2013 0930   MONOABS 0.7 04/26/2013 0930   EOSABS 0.3 04/26/2013 0930   BASOSABS 0.0 04/26/2013 0930    No results found for: POCLITH, LITHIUM   No results found for: PHENYTOIN, PHENOBARB, VALPROATE, CBMZ   .res Assessment: Plan:     Bipolar I disorder (Protection) - Plan: Lithium level  Generalized anxiety disorder  Low vitamin D level - Plan: Vitamin D 1,25 dihydroxy   Greater than 50% of face to face time with patient was spent on counseling and coordination of care. We discussed chronic bipolar depression with low motivation and some anhedonia.  Just at the last visit possibility of increasing the bupropion but deferred it. Increase bupropion xl from 300 to 450 mg q AM for motivation, mood, energy, activity.  Call  if mania.  Discussed this and other side effect risks. Check Vitamin D, associated with depression and fatigue Check Lithium level. Last in Feb was 0.4 he does not tolerate much more lithium than this. Discussed potential metabolic side effects associated with atypical antipsychotics, as well as potential risk for movement side effects. Advised pt to contact office if movement side effects occur.  Follow-up 8 weeks Please see After Visit Summary for patient specific instructions. This is a 30-minute appointment today Future Appointments  Date Time Provider [place]  01/11/2018 10:00 AM Cottle, Billey Co., MD [facility] None    Orders Placed This Encounter  Procedures  . Lithium level  . Vitamin D 1,25 dihydroxy   Lynder Parents MD, DFAPA   -------------------------------

## 2017-11-09 ENCOUNTER — Other Ambulatory Visit: Payer: Self-pay

## 2017-11-09 MED ORDER — BUPROPION HCL ER (XL) 300 MG PO TB24: 300.0000 mg | ORAL_TABLET | Freq: Every day | ORAL | 5 refills | Status: DC

## 2017-11-09 NOTE — Telephone Encounter (Signed)
Pt called stating he's unable to take 450mg  Wellbutrin XL, made his legs feel weak and causing him to stumble. Provider recommends to decrease back to 300mg  XL. Pharmacy updated with refills. Pt aware.

## 2017-11-11 ENCOUNTER — Encounter: Payer: Self-pay | Admitting: Gastroenterology

## 2017-12-22 ENCOUNTER — Other Ambulatory Visit: Payer: Self-pay

## 2017-12-22 MED ORDER — QUETIAPINE FUMARATE 300 MG PO TABS: 600.0000 mg | ORAL_TABLET | Freq: Every day | ORAL | 0 refills | Status: DC

## 2017-12-27 ENCOUNTER — Other Ambulatory Visit: Payer: Self-pay

## 2017-12-27 MED ORDER — LITHIUM CARBONATE 300 MG PO CAPS: 600.0000 mg | ORAL_CAPSULE | Freq: Every evening | ORAL | 1 refills | Status: DC

## 2018-01-11 ENCOUNTER — Ambulatory Visit: Payer: 59 | Admitting: Psychiatry

## 2018-01-11 DIAGNOSIS — R7989 Other specified abnormal findings of blood chemistry: Secondary | ICD-10-CM | POA: Diagnosis not present

## 2018-01-11 DIAGNOSIS — F319 Bipolar disorder, unspecified: Secondary | ICD-10-CM | POA: Diagnosis not present

## 2018-01-12 ENCOUNTER — Encounter: Payer: Self-pay | Admitting: Psychiatry

## 2018-01-12 ENCOUNTER — Ambulatory Visit: Payer: 59 | Admitting: Psychiatry

## 2018-01-12 VITALS — BP 132/84 | HR 73

## 2018-01-12 DIAGNOSIS — R7989 Other specified abnormal findings of blood chemistry: Secondary | ICD-10-CM

## 2018-01-12 DIAGNOSIS — F319 Bipolar disorder, unspecified: Secondary | ICD-10-CM

## 2018-01-12 DIAGNOSIS — F900 Attention-deficit hyperactivity disorder, predominantly inattentive type: Secondary | ICD-10-CM

## 2018-01-12 DIAGNOSIS — F411 Generalized anxiety disorder: Secondary | ICD-10-CM

## 2018-01-12 MED ORDER — METHYLPHENIDATE HCL ER 27 MG PO TB24: 27.0000 mg | ORAL_TABLET | Freq: Every day | ORAL | 0 refills | Status: DC

## 2018-01-12 NOTE — Progress Notes (Signed)
Brian Guerrero [id number] [birth year] 68 y.o.  Subjective:   Patient ID:  Brian Guerrero is a 68 y.o. (DOB [birth year]) male.  Chief Complaint:  Chief Complaint  Patient presents with  . Follow-up    Medication Management    HPI last seen 10/30 Brian Guerrero presents to the office today for follow-up of med change.  Increased Wellbutrin from 300 to 450 mg a day to help with low energy and flatness.  Hasn't noticed a lot.  Thinking about retirement bc poor concentration.  Not a new thing but seems worse.  Not significantly depressed.  Patient reports stable mood and denies depressed or irritable moods.  Patient reports occ anxiety over the tremor.  Patient denies difficulty with sleep initiation or maintenance. Denies appetite disturbance.  Patient reports that energy and motivation have been good.  Patient denies any difficulty with concentration.  Patient denies any suicidal ideation.   Review of Systems:  Review of Systems  Neurological: Positive for tremors. Negative for weakness.  Psychiatric/Behavioral: Positive for decreased concentration. Negative for agitation, behavioral problems, confusion, dysphoric mood, hallucinations, self-injury, sleep disturbance and suicidal ideas. The patient is not nervous/anxious and is not hyperactive.     Medications: I have reviewed the patient's current medications.  Current Outpatient Medications  Medication Sig Dispense Refill  . aspirin EC 81 MG tablet Take 81 mg by mouth daily.    [name] buPROPion (WELLBUTRIN XL) 300 MG 24 hr tablet Take 1 tablet (300 mg total) by mouth daily. (Patient taking differently: Take 450 mg by mouth daily. ) 30 tablet 5  . finasteride (PROSCAR) 5 MG tablet Take 5 mg by mouth daily.    [name] lithium carbonate 300 MG capsule Take 2 capsules (600 mg total) by mouth every evening. (Patient taking differently: Take 300 mg by mouth 2 (two) times daily. ) 180 capsule 1  . propranolol (INDERAL) 20 MG tablet Take 20 mg by mouth  daily.    . QUEtiapine (SEROQUEL) 300 MG tablet Take 2 tablets (600 mg total) by mouth at bedtime. (Patient taking differently: Take 450 mg by mouth at bedtime. ) 180 tablet 0  . methylphenidate 27 MG PO TB24 Take 1 tablet (27 mg total) by mouth daily. 30 tablet 0   No current facility-administered medications for this visit.     Medication Side Effects: Other: tremor  Allergies:  Allergies  Allergen Reactions  . Lincomycin Hcl     As a child/had a hive    Past Medical History:  Diagnosis Date  . Attention deficit disorder 10/19/2017   per pt/ not aware of having ADD  . BPH (benign prostatic hyperplasia)   . Depression with anxiety   . Diverticular disease    [facility]/per pt not aware of this  . Varicose veins   . Wears glasses     Family History  Problem Relation Age of Onset  . Depression Mother   . Prostate cancer Father   . Kidney disease Father     Social History   Socioeconomic History  . Marital status: Married    Spouse name: Not on file  . Number of children: Not on file  . Years of education: Not on file  . Highest education level: Not on file  Occupational History  . Not on file  Social Needs  . Financial resource strain: Not on file  . Food insecurity:    Worry: Not on file    Inability: Not on file  . Transportation needs:  Medical: Not on file    Non-medical: Not on file  Tobacco Use  . Smoking status: Never Smoker  . Smokeless tobacco: Never Used  Substance and Sexual Activity  . Alcohol use: Yes    Alcohol/week: 2.0 standard drinks    Types: 2 Standard drinks or equivalent per week  . Drug use: Not Currently  . Sexual activity: Not on file  Lifestyle  . Physical activity:    Days per week: Not on file    Minutes per session: Not on file  . Stress: Not on file  Relationships  . Social connections:    Talks on phone: Not on file    Gets together: Not on file    Attends religious service: Not on file    Active member of club or  organization: Not on file    Attends meetings of clubs or organizations: Not on file    Relationship status: Not on file  . Intimate partner violence:    Fear of current or ex partner: Not on file    Emotionally abused: Not on file    Physically abused: Not on file    Forced sexual activity: Not on file  Other Topics Concern  . Not on file  Social History Narrative  . Not on file    Past Medical History, Surgical history, Social history, and Family history were reviewed and updated as appropriate.   Please see review of systems for further details on the patient's review from today.   Objective:   Physical Exam:  BP 132/84 (BP Location: Left Arm)   Pulse 73   Physical Exam Constitutional:      General: He is not in acute distress.    Appearance: He is well-developed.  Musculoskeletal:        General: No deformity.  Neurological:     Mental Status: He is alert and oriented to person, place, and time.     Motor: Tremor present.     Coordination: Coordination normal.     Gait: Gait normal.  Psychiatric:        Attention and Perception: Attention and perception normal.        Mood and Affect: Mood is not anxious or depressed. Affect is blunt. Affect is not labile, angry or inappropriate.        Behavior: Behavior normal.        Thought Content: Thought content normal. Thought content does not include homicidal or suicidal ideation. Thought content does not include homicidal or suicidal plan.        Cognition and Memory: Cognition normal.        Judgment: Judgment normal.     Comments: Insight intact. No auditory or visual hallucinations. No delusions.  Speech fluent chronically somewhat hyperverbal.     Lab Review:     Component Value Date/Time   NA 140 04/26/2013 0930   K 4.7 04/26/2013 0930   CL 105 04/26/2013 0930   CO2 25 04/26/2013 0930   GLUCOSE 93 04/26/2013 0930   BUN 21 04/26/2013 0930   CREATININE 0.85 04/26/2013 0930   CALCIUM 9.1 04/26/2013 0930    GFRNONAA >90 04/26/2013 0930   GFRAA >90 04/26/2013 0930       Component Value Date/Time   WBC 6.4 04/26/2013 0930   RBC 4.69 04/26/2013 0930   HGB 14.6 04/26/2013 0930   HCT 42.3 04/26/2013 0930   PLT 266 04/26/2013 0930   MCV 90.2 04/26/2013 0930   MCH 31.1 04/26/2013 0930  MCHC 34.5 04/26/2013 0930   RDW 13.4 04/26/2013 0930   LYMPHSABS 1.2 04/26/2013 0930   MONOABS 0.7 04/26/2013 0930   EOSABS 0.3 04/26/2013 0930   BASOSABS 0.0 04/26/2013 0930    No results found for: POCLITH, LITHIUM   No results found for: PHENYTOIN, PHENOBARB, VALPROATE, CBMZ   .res Assessment: Plan:    Bipolar I disorder with depression (Brian Guerrero) - Plan: methylphenidate 27 MG PO TB24  Generalized anxiety disorder  Low vitamin D level  Attention deficit hyperactivity disorder (ADHD), predominantly inattentive type - Plan: methylphenidate 27 MG PO TB24   I believe that his problems with concentration are related to a type of acquired ADD due to the consequence of chronic bipolar depression with inadequate response to typical bipolar disorder medications as well as Wellbutrin.  Since this concentration problem is serious enough to be affecting his job function and potentially forcing retirement I offered him the option of treating it with stimulants. Discussed potential benefits, risks, and side effects of stimulants with patient to include increased heart rate, palpitations, insomnia, increased anxiety, increased irritability, or decreased appetite.  Instructed patient to contact office if experiencing any significant tolerability issues.  He'd like to pursue this due to significant concerns over work performance and lack of benefit with the increase in Wellbutrin.  Disc risk of mania.  Reduce Wellbutrin to 300 mg daily and start generic Concerta 27 mg daily.  If there is any jitteriness reduce the Wellbutrin again 250 mg daily.  If there is still side effects call.  If there is no benefit for  concentration after 2 weeks or more then call and we will increase the Concerta to 36 mg daily.  Limit caffeine for tremor and tolerability of stimulant.  After Visit Summary for patient specific instructions.: Reduce Wellbutrin to 300 mg daily and start generic Concerta 27 mg daily.  If there is any jitteriness reduce the Wellbutrin again 250 mg daily.  If there is still side effects call.  If there is no benefit for concentration after 2 weeks or more then call and we will increase the Concerta to 36 mg daily.  This appt was 30 mins.  FU 6 weeks  Brian Parents, Brian Guerrero, DFAPA   Future Appointments  Date Time Provider [place]  02/22/2018 11:30 AM Brian Guerrero, Brian Co., Brian Guerrero CP-CP None    No orders of the defined types were placed in this encounter.     -------------------------------

## 2018-01-12 NOTE — Patient Instructions (Signed)
Reduce Wellbutrin to 300 mg daily and start generic Concerta 27 mg daily.  If there is any jitteriness reduce the Wellbutrin again 250 mg daily.  If there is still side effects call.  If there is no benefit for concentration after 2 weeks or more then call and we will increase the Concerta to 36 mg daily.

## 2018-01-17 LAB — LITHIUM LEVEL: Lithium Lvl: 0.5 mmol/L — ABNORMAL LOW (ref 0.6–1.2)

## 2018-01-17 LAB — VITAMIN D 1,25 DIHYDROXY
Vitamin D 1, 25 (OH)2 Total: 36 pg/mL
Vitamin D2 1, 25 (OH)2: <10 pg/mL
Vitamin D3 1, 25 (OH)2: 36 pg/mL

## 2018-02-08 ENCOUNTER — Other Ambulatory Visit: Payer: Self-pay | Admitting: Psychiatry

## 2018-02-08 DIAGNOSIS — F900 Attention-deficit hyperactivity disorder, predominantly inattentive type: Secondary | ICD-10-CM

## 2018-02-08 DIAGNOSIS — F319 Bipolar disorder, unspecified: Secondary | ICD-10-CM

## 2018-02-09 NOTE — Telephone Encounter (Signed)
Please submit  Last fill 01/12/2018 #30

## 2018-02-10 DIAGNOSIS — H02831 Dermatochalasis of right upper eyelid: Secondary | ICD-10-CM | POA: Diagnosis not present

## 2018-02-10 DIAGNOSIS — H04123 Dry eye syndrome of bilateral lacrimal glands: Secondary | ICD-10-CM | POA: Diagnosis not present

## 2018-02-10 DIAGNOSIS — H40033 Anatomical narrow angle, bilateral: Secondary | ICD-10-CM | POA: Diagnosis not present

## 2018-02-10 DIAGNOSIS — H43811 Vitreous degeneration, right eye: Secondary | ICD-10-CM | POA: Diagnosis not present

## 2018-02-10 DIAGNOSIS — H25813 Combined forms of age-related cataract, bilateral: Secondary | ICD-10-CM | POA: Diagnosis not present

## 2018-02-22 ENCOUNTER — Ambulatory Visit: Payer: 59 | Admitting: Psychiatry

## 2018-03-07 ENCOUNTER — Encounter: Payer: Self-pay | Admitting: Psychiatry

## 2018-03-07 ENCOUNTER — Ambulatory Visit: Payer: 59 | Admitting: Psychiatry

## 2018-03-07 VITALS — BP 131/72 | HR 72

## 2018-03-07 DIAGNOSIS — F319 Bipolar disorder, unspecified: Secondary | ICD-10-CM | POA: Diagnosis not present

## 2018-03-07 DIAGNOSIS — F411 Generalized anxiety disorder: Secondary | ICD-10-CM | POA: Diagnosis not present

## 2018-03-07 DIAGNOSIS — R7989 Other specified abnormal findings of blood chemistry: Secondary | ICD-10-CM

## 2018-03-07 DIAGNOSIS — F900 Attention-deficit hyperactivity disorder, predominantly inattentive type: Secondary | ICD-10-CM | POA: Diagnosis not present

## 2018-03-07 DIAGNOSIS — G251 Drug-induced tremor: Secondary | ICD-10-CM | POA: Diagnosis not present

## 2018-03-07 MED ORDER — METHYLPHENIDATE HCL ER (OSM) 36 MG PO TBCR: 36.0000 mg | EXTENDED_RELEASE_TABLET | Freq: Every day | ORAL | 0 refills | Status: DC

## 2018-03-07 NOTE — Progress Notes (Signed)
Brian Guerrero [id number] 01/05/1950 68 y.o.  Subjective:   Patient ID:  Brian Guerrero is a 68 y.o. (DOB [birth year]) male.  Chief Complaint:  Chief Complaint  Patient presents with  . Follow-up    Medication Management  . ADD  . Anxiety  . Stress    job    HPI last seen January 9 Brian Guerrero presents to the office today for follow-up of med change.  Last visit reduced the Wellbutrin and added Concerta 27.  More stamina and more alert but still struggle with concentration and memory and tremor.  Can't say if it's new or a lifelong thing.  Tolerating the Concerta OK.  Thinks he could tolerate an increase in the Concerta.  Occ propranolol for tremor.  Thinking about retirement bc poor concentration.  Not a new thing but seems worse.  Not significantly depressed.  Patient reports stable mood and denies depressed or irritable moods.  Patient reports occ anxiety over the tremor.  Patient denies difficulty with sleep initiation or maintenance. Denies appetite disturbance.  Patient reports that energy and motivation have been good.  Patient denies any difficulty with concentration.  Patient denies any suicidal ideation.   Review of Systems:  Review of Systems  Neurological: Positive for tremors. Negative for weakness.  Psychiatric/Behavioral: Positive for decreased concentration. Negative for agitation, behavioral problems, confusion, dysphoric mood, hallucinations, self-injury, sleep disturbance and suicidal ideas. The patient is not nervous/anxious and is not hyperactive.     Medications: I have reviewed the patient's current medications.  Current Outpatient Medications  Medication Sig Dispense Refill  . aspirin EC 81 MG tablet Take 81 mg by mouth daily.    [name] buPROPion (WELLBUTRIN XL) 300 MG 24 hr tablet Take 1 tablet (300 mg total) by mouth daily. (Patient taking differently: Take 600 mg by mouth daily. ) 30 tablet 5  . CONCERTA 27 MG CR tablet TAKE 1 TABLET (27 MG TOTAL) BY MOUTH  DAILY. 30 tablet 0  . finasteride (PROSCAR) 5 MG tablet Take 5 mg by mouth daily.    [name] lithium carbonate 300 MG capsule Take 2 capsules (600 mg total) by mouth every evening. (Patient taking differently: Take 300 mg by mouth 2 (two) times daily. ) 180 capsule 1  . propranolol (INDERAL) 20 MG tablet Take 20 mg by mouth daily.    . QUEtiapine (SEROQUEL) 300 MG tablet Take 2 tablets (600 mg total) by mouth at bedtime. (Patient taking differently: Take 450 mg by mouth at bedtime. ) 180 tablet 0  . methylphenidate (CONCERTA) 36 MG PO CR tablet Take 1 tablet (36 mg total) by mouth daily. Cancel 27 mg refills 30 tablet 0  . [START ON 04/04/2018] methylphenidate (CONCERTA) 36 MG PO CR tablet Take 1 tablet (36 mg total) by mouth daily. 30 tablet 0   No current facility-administered medications for this visit.     Medication Side Effects: Other: tremor  Allergies:  Allergies  Allergen Reactions  . Lincomycin Hcl     As a child/had a hive    Past Medical History:  Diagnosis Date  . Attention deficit disorder 10/19/2017   per pt/ not aware of having ADD  . BPH (benign prostatic hyperplasia)   . Depression with anxiety   . Diverticular disease    [facility]/per pt not aware of this  . Varicose veins   . Wears glasses     Family History  Problem Relation Age of Onset  . Depression Mother   . Prostate  cancer Father   . Kidney disease Father     Social History   Socioeconomic History  . Marital status: Married    Spouse name: Not on file  . Number of children: Not on file  . Years of education: Not on file  . Highest education level: Not on file  Occupational History  . Not on file  Social Needs  . Financial resource strain: Not on file  . Food insecurity:    Worry: Not on file    Inability: Not on file  . Transportation needs:    Medical: Not on file    Non-medical: Not on file  Tobacco Use  . Smoking status: Never Smoker  . Smokeless tobacco: Never Used  Substance and  Sexual Activity  . Alcohol use: Yes    Alcohol/week: 2.0 standard drinks    Types: 2 Standard drinks or equivalent per week  . Drug use: Not Currently  . Sexual activity: Not on file  Lifestyle  . Physical activity:    Days per week: Not on file    Minutes per session: Not on file  . Stress: Not on file  Relationships  . Social connections:    Talks on phone: Not on file    Gets together: Not on file    Attends religious service: Not on file    Active member of club or organization: Not on file    Attends meetings of clubs or organizations: Not on file    Relationship status: Not on file  . Intimate partner violence:    Fear of current or ex partner: Not on file    Emotionally abused: Not on file    Physically abused: Not on file    Forced sexual activity: Not on file  Other Topics Concern  . Not on file  Social History Narrative  . Not on file    Past Medical History, Surgical history, Social history, and Family history were reviewed and updated as appropriate.   Please see review of systems for further details on the patient's review from today.   Objective:   Physical Exam:  BP 131/72 (BP Location: Left Arm)   Pulse 72   Physical Exam Constitutional:      General: He is not in acute distress.    Appearance: He is well-developed.  Musculoskeletal:        General: No deformity.  Neurological:     Mental Status: He is alert and oriented to person, place, and time.     Motor: Tremor present.     Coordination: Coordination normal.     Gait: Gait normal.  Psychiatric:        Attention and Perception: Attention and perception normal.        Mood and Affect: Mood is anxious. Mood is not depressed. Affect is blunt. Affect is not labile, angry or inappropriate.        Behavior: Behavior normal.        Thought Content: Thought content normal. Thought content does not include homicidal or suicidal ideation. Thought content does not include homicidal or suicidal plan.         Cognition and Memory: Cognition normal.        Judgment: Judgment normal.     Comments: Insight intact. No auditory or visual hallucinations. No delusions.  Speech fluent chronically somewhat hyperverbal.     Lab Review:     Component Value Date/Time   NA 140 04/26/2013 0930   K 4.7 04/26/2013 0930  CL 105 04/26/2013 0930   CO2 25 04/26/2013 0930   GLUCOSE 93 04/26/2013 0930   BUN 21 04/26/2013 0930   CREATININE 0.85 04/26/2013 0930   CALCIUM 9.1 04/26/2013 0930   GFRNONAA >90 04/26/2013 0930   GFRAA >90 04/26/2013 0930       Component Value Date/Time   WBC 6.4 04/26/2013 0930   RBC 4.69 04/26/2013 0930   HGB 14.6 04/26/2013 0930   HCT 42.3 04/26/2013 0930   PLT 266 04/26/2013 0930   MCV 90.2 04/26/2013 0930   MCH 31.1 04/26/2013 0930   MCHC 34.5 04/26/2013 0930   RDW 13.4 04/26/2013 0930   LYMPHSABS 1.2 04/26/2013 0930   MONOABS 0.7 04/26/2013 0930   EOSABS 0.3 04/26/2013 0930   BASOSABS 0.0 04/26/2013 0930    Lithium Lvl  Date Value Ref Range Status  01/11/2018 0.5 (L) 0.6 - 1.2 mmol/L Final    Comment:                                     Detection Limit = 0.1                           <0.1 indicates None Detected      No results found for: PHENYTOIN, PHENOBARB, VALPROATE, CBMZ   .res Assessment: Plan:    Attention deficit hyperactivity disorder (ADHD), predominantly inattentive type - Plan: methylphenidate (CONCERTA) 36 MG PO CR tablet, methylphenidate (CONCERTA) 36 MG PO CR tablet  Lithium-induced tremor  Bipolar I disorder with depression ([place])  Generalized anxiety disorder  Low vitamin D level   I believe that his problems with concentration are related to a type of acquired ADD due to the consequence of chronic bipolar depression with inadequate response to typical bipolar disorder medications as well as Wellbutrin.  Since this concentration problem is serious enough to be affecting his job function and potentially forcing retirement I  offered him the option of treating it with stimulants. Discussed potential benefits, risks, and side effects of stimulants with patient to include increased heart rate, palpitations, insomnia, increased anxiety, increased irritability, or decreased appetite.  Instructed patient to contact office if experiencing any significant tolerability issues.  He'd like to pursue an increase in Concerta due to significant concerns over work performance and lack of benefit with the increase in Wellbutrin.  Disc risk of mania.  Continue Wellbutrin to 300 mg daily and increase Concerta 36 mg daily.  If there is any jitteriness reduce the Wellbutrin again 150 mg daily.  If there is still side effects call.  Limit caffeine for tremor and tolerability of stimulant.  Recommend consistent use of propranolol before work to help with the tremor.  Lithium level is in the low normal range because that is the most he can tolerate.  His vitamin D level was 36 and consideration could be given to increasing vitamin D.  No changes today  After Visit Summary for patient specific instructions.:  FU 8 weeks  Lynder Parents, MD, DFAPA   No future appointments.  No orders of the defined types were placed in this encounter.     -------------------------------

## 2018-03-08 DIAGNOSIS — R972 Elevated prostate specific antigen [PSA]: Secondary | ICD-10-CM | POA: Diagnosis not present

## 2018-03-15 DIAGNOSIS — N5201 Erectile dysfunction due to arterial insufficiency: Secondary | ICD-10-CM | POA: Diagnosis not present

## 2018-03-15 DIAGNOSIS — R972 Elevated prostate specific antigen [PSA]: Secondary | ICD-10-CM | POA: Diagnosis not present

## 2018-03-29 DIAGNOSIS — N400 Enlarged prostate without lower urinary tract symptoms: Secondary | ICD-10-CM | POA: Diagnosis not present

## 2018-03-29 DIAGNOSIS — Z13220 Encounter for screening for lipoid disorders: Secondary | ICD-10-CM | POA: Diagnosis not present

## 2018-03-29 DIAGNOSIS — R972 Elevated prostate specific antigen [PSA]: Secondary | ICD-10-CM | POA: Diagnosis not present

## 2018-03-29 DIAGNOSIS — F3181 Bipolar II disorder: Secondary | ICD-10-CM | POA: Diagnosis not present

## 2018-03-29 DIAGNOSIS — Z1389 Encounter for screening for other disorder: Secondary | ICD-10-CM | POA: Diagnosis not present

## 2018-03-29 DIAGNOSIS — G250 Essential tremor: Secondary | ICD-10-CM | POA: Diagnosis not present

## 2018-03-29 DIAGNOSIS — R739 Hyperglycemia, unspecified: Secondary | ICD-10-CM | POA: Diagnosis not present

## 2018-04-12 ENCOUNTER — Other Ambulatory Visit: Payer: Self-pay | Admitting: Psychiatry

## 2018-04-27 ENCOUNTER — Other Ambulatory Visit: Payer: Self-pay | Admitting: Psychiatry

## 2018-05-01 ENCOUNTER — Other Ambulatory Visit: Payer: Self-pay | Admitting: Psychiatry

## 2018-05-10 ENCOUNTER — Other Ambulatory Visit: Payer: Self-pay

## 2018-05-10 ENCOUNTER — Other Ambulatory Visit: Payer: Self-pay | Admitting: Psychiatry

## 2018-05-10 ENCOUNTER — Ambulatory Visit: Payer: 59 | Admitting: Psychiatry

## 2018-05-10 DIAGNOSIS — F900 Attention-deficit hyperactivity disorder, predominantly inattentive type: Secondary | ICD-10-CM

## 2018-05-11 ENCOUNTER — Other Ambulatory Visit: Payer: Self-pay | Admitting: Psychiatry

## 2018-05-11 ENCOUNTER — Telehealth: Payer: Self-pay | Admitting: Psychiatry

## 2018-05-11 DIAGNOSIS — F900 Attention-deficit hyperactivity disorder, predominantly inattentive type: Secondary | ICD-10-CM

## 2018-05-11 MED ORDER — METHYLPHENIDATE HCL ER (OSM) 36 MG PO TBCR: 36.0000 mg | EXTENDED_RELEASE_TABLET | Freq: Every day | ORAL | 0 refills | Status: DC

## 2018-05-11 NOTE — Telephone Encounter (Signed)
Patient contacted [facility] on 05/06 to get prescription for Concerta and the pharmacy stated they had not received request from Dr. Clovis Pu

## 2018-05-11 NOTE — Telephone Encounter (Signed)
Next appt 05/29 Last fill 04/01

## 2018-05-11 NOTE — Telephone Encounter (Signed)
error 

## 2018-05-26 DIAGNOSIS — H524 Presbyopia: Secondary | ICD-10-CM | POA: Diagnosis not present

## 2018-05-26 DIAGNOSIS — H25013 Cortical age-related cataract, bilateral: Secondary | ICD-10-CM | POA: Diagnosis not present

## 2018-05-26 DIAGNOSIS — H5213 Myopia, bilateral: Secondary | ICD-10-CM | POA: Diagnosis not present

## 2018-05-26 DIAGNOSIS — H35413 Lattice degeneration of retina, bilateral: Secondary | ICD-10-CM | POA: Diagnosis not present

## 2018-05-26 DIAGNOSIS — H52223 Regular astigmatism, bilateral: Secondary | ICD-10-CM | POA: Diagnosis not present

## 2018-05-31 ENCOUNTER — Telehealth: Payer: Self-pay | Admitting: Psychiatry

## 2018-05-31 NOTE — Telephone Encounter (Signed)
Last fill 41/96 for Concerta.

## 2018-05-31 NOTE — Telephone Encounter (Signed)
Patient left vm today @11 :37 stating he will be going out of town for a month beginning Saturday and he will need refills on Concerta 30 mg, and Burpropion 150 mg, will he need to get a paper script to take with him or can he have them filled early?

## 2018-06-01 ENCOUNTER — Other Ambulatory Visit: Payer: Self-pay | Admitting: Psychiatry

## 2018-06-01 DIAGNOSIS — F319 Bipolar disorder, unspecified: Secondary | ICD-10-CM

## 2018-06-01 DIAGNOSIS — F900 Attention-deficit hyperactivity disorder, predominantly inattentive type: Secondary | ICD-10-CM

## 2018-06-01 MED ORDER — METHYLPHENIDATE HCL ER (OSM) 27 MG PO TBCR: 27.0000 mg | EXTENDED_RELEASE_TABLET | ORAL | 0 refills | Status: DC

## 2018-06-01 MED ORDER — BUPROPION HCL ER (XL) 150 MG PO TB24: 450.0000 mg | ORAL_TABLET | Freq: Every day | ORAL | 5 refills | Status: DC

## 2018-06-01 NOTE — Progress Notes (Signed)
Patient is going out of town and needs early refill.  Okay Concerta and Wellbutrin early.

## 2018-06-01 NOTE — Telephone Encounter (Signed)
See previous note high priority

## 2018-06-01 NOTE — Telephone Encounter (Signed)
Please let him know I sent this to the pharmacy asking for early refill.  If for some reason insurance denies it let me know as soon as possible on Friday and I will print him a prescription to take with him

## 2018-06-02 ENCOUNTER — Other Ambulatory Visit: Payer: Self-pay

## 2018-06-02 ENCOUNTER — Encounter: Payer: Self-pay | Admitting: Psychiatry

## 2018-06-02 ENCOUNTER — Ambulatory Visit (INDEPENDENT_AMBULATORY_CARE_PROVIDER_SITE_OTHER): Payer: 59 | Admitting: Psychiatry

## 2018-06-02 DIAGNOSIS — F900 Attention-deficit hyperactivity disorder, predominantly inattentive type: Secondary | ICD-10-CM

## 2018-06-02 DIAGNOSIS — R7989 Other specified abnormal findings of blood chemistry: Secondary | ICD-10-CM

## 2018-06-02 DIAGNOSIS — G251 Drug-induced tremor: Secondary | ICD-10-CM

## 2018-06-02 DIAGNOSIS — F411 Generalized anxiety disorder: Secondary | ICD-10-CM

## 2018-06-02 DIAGNOSIS — F319 Bipolar disorder, unspecified: Secondary | ICD-10-CM | POA: Diagnosis not present

## 2018-06-02 NOTE — Progress Notes (Signed)
Brian Guerrero [id number] [birth year] 68 y.o.  Subjective:   Patient ID:  Brian Guerrero is a 68 y.o. (DOB 05/02/1950) male.  Chief Complaint:  Chief Complaint  Patient presents with  . ADD    med changed  . Follow-up    Bipolar depression and anxiety    HPI  Brian Guerrero presents to the office today for follow-up of med change.  His last visit was March 07, 2018.  For problems with attention and focus Concerta was increased from 27 mg a day to 30 mg a day.  In January we reduced the Wellbutrin and added Concerta 27.  More stamina and more alert but still struggle with concentration and memory and tremor.  Can't say if it's new or a lifelong thing.  Tolerating the Concerta OK.  Thinks he could tolerate an increase in the Concerta.  Occ propranolol for tremor.  Since March, increased Concerta is working very well.  More energy.  Better focus.  Less worry.    Retired couple weeks ago at 68 yo. Feels good about it.  Enjoying it so far.  Working on new schedule.  Tackling some home projects. Motivation is better.  No depression.    Thinking about retirement bc poor concentration.  Not a new thing but seems worse.  Not significantly depressed.  Patient reports stable mood and denies depressed or irritable moods.  Patient reports occ anxiety over the tremor.  Patient denies difficulty with sleep initiation or maintenance. 8 hours.  Denies appetite disturbance.  Patient reports that energy and motivation have been good.  Patient denies any difficulty with concentration.  Patient denies any suicidal ideation.  Starts Medicare June 1.  Will need tier reduction request for Concerta bc will be tier 4 $90.  Past Psychiatric Medication Trials:    Review of Systems:  Review of Systems  Neurological: Positive for tremors. Negative for weakness.  Psychiatric/Behavioral: Negative for agitation, behavioral problems, confusion, decreased concentration, dysphoric mood, hallucinations, self-injury,  sleep disturbance and suicidal ideas. The patient is not nervous/anxious and is not hyperactive.   tremor variable worse with caffeine  Medications: I have reviewed the patient's current medications.  Current Outpatient Medications  Medication Sig Dispense Refill  . aspirin EC 81 MG tablet Take 81 mg by mouth daily.    [name] buPROPion (WELLBUTRIN XL) 150 MG 24 hr tablet Take 3 tablets (450 mg total) by mouth daily. (Patient taking differently: Take 300 mg by mouth daily. ) 90 tablet 5  . finasteride (PROSCAR) 5 MG tablet Take 5 mg by mouth daily.    [name] lithium carbonate 300 MG capsule Take 2 capsules (600 mg total) by mouth every evening. (Patient taking differently: Take 300 mg by mouth 2 (two) times daily. ) 180 capsule 1  . methylphenidate (CONCERTA) 36 MG PO CR tablet Take 1 tablet (36 mg total) by mouth daily. 30 tablet 0  . methylphenidate (CONCERTA) 36 MG PO CR tablet Take 1 tablet (36 mg total) by mouth daily. Cancel 27 mg refills 30 tablet 0  . QUEtiapine (SEROQUEL) 300 MG tablet TAKE 2 TABLETS (600 MG TOTAL) BY MOUTH AT BEDTIME. 180 tablet 0  . propranolol (INDERAL) 20 MG tablet Take 20 mg by mouth daily.     No current facility-administered medications for this visit.     Medication Side Effects: Other: tremor  Allergies:  Allergies  Allergen Reactions  . Lincomycin Hcl     As a child/had a hive    Past Medical  History:  Diagnosis Date  . Attention deficit disorder 10/19/2017   per pt/ not aware of having ADD  . BPH (benign prostatic hyperplasia)   . Depression with anxiety   . Diverticular disease    [facility]/per pt not aware of this  . Varicose veins   . Wears glasses     Family History  Problem Relation Age of Onset  . Depression Mother   . Prostate cancer Father   . Kidney disease Father     Social History   Socioeconomic History  . Marital status: Married    Spouse name: Not on file  . Number of children: Not on file  . Years of education: Not on  file  . Highest education level: Not on file  Occupational History  . Not on file  Social Needs  . Financial resource strain: Not on file  . Food insecurity:    Worry: Not on file    Inability: Not on file  . Transportation needs:    Medical: Not on file    Non-medical: Not on file  Tobacco Use  . Smoking status: Never Smoker  . Smokeless tobacco: Never Used  Substance and Sexual Activity  . Alcohol use: Yes    Alcohol/week: 2.0 standard drinks    Types: 2 Standard drinks or equivalent per week  . Drug use: Not Currently  . Sexual activity: Not on file  Lifestyle  . Physical activity:    Days per week: Not on file    Minutes per session: Not on file  . Stress: Not on file  Relationships  . Social connections:    Talks on phone: Not on file    Gets together: Not on file    Attends religious service: Not on file    Active member of club or organization: Not on file    Attends meetings of clubs or organizations: Not on file    Relationship status: Not on file  . Intimate partner violence:    Fear of current or ex partner: Not on file    Emotionally abused: Not on file    Physically abused: Not on file    Forced sexual activity: Not on file  Other Topics Concern  . Not on file  Social History Narrative  . Not on file    Past Medical History, Surgical history, Social history, and Family history were reviewed and updated as appropriate.   Please see review of systems for further details on the patient's review from today.   Objective:   Physical Exam:  There were no vitals taken for this visit.  Physical Exam Neurological:     Mental Status: He is alert and oriented to person, place, and time.     Cranial Nerves: No dysarthria.  Psychiatric:        Attention and Perception: Attention normal.        Mood and Affect: Mood is not anxious or depressed.        Speech: Speech normal.        Behavior: Behavior is cooperative.        Thought Content: Thought content  normal. Thought content is not paranoid or delusional. Thought content does not include homicidal or suicidal ideation. Thought content does not include homicidal or suicidal plan.        Cognition and Memory: Cognition and memory normal.        Judgment: Judgment normal.     Comments: Insight fair.  His mood is improved  and his anxiety is improved with the increase in Concerta.     Lab Review:     Component Value Date/Time   NA 140 04/26/2013 0930   K 4.7 04/26/2013 0930   CL 105 04/26/2013 0930   CO2 25 04/26/2013 0930   GLUCOSE 93 04/26/2013 0930   BUN 21 04/26/2013 0930   CREATININE 0.85 04/26/2013 0930   CALCIUM 9.1 04/26/2013 0930   GFRNONAA >90 04/26/2013 0930   GFRAA >90 04/26/2013 0930       Component Value Date/Time   WBC 6.4 04/26/2013 0930   RBC 4.69 04/26/2013 0930   HGB 14.6 04/26/2013 0930   HCT 42.3 04/26/2013 0930   PLT 266 04/26/2013 0930   MCV 90.2 04/26/2013 0930   MCH 31.1 04/26/2013 0930   MCHC 34.5 04/26/2013 0930   RDW 13.4 04/26/2013 0930   LYMPHSABS 1.2 04/26/2013 0930   MONOABS 0.7 04/26/2013 0930   EOSABS 0.3 04/26/2013 0930   BASOSABS 0.0 04/26/2013 0930    Lithium Lvl  Date Value Ref Range Status  01/11/2018 0.5 (L) 0.6 - 1.2 mmol/L Final    Comment:                                     Detection Limit = 0.1                           <0.1 indicates None Detected      No results found for: PHENYTOIN, PHENOBARB, VALPROATE, CBMZ   .res Assessment: Plan:    Bipolar I disorder with depression ([place])  Attention deficit hyperactivity disorder (ADHD), predominantly inattentive type  Lithium-induced tremor  Generalized anxiety disorder  Low vitamin D level   I believe that his problems with concentration are related to a type of acquired ADD due to the consequence of chronic bipolar depression with inadequate response to typical bipolar disorder medications as well as Wellbutrin.  Since this concentration problem was serious enough to  be affecting his job function and potentially forcing retirement I offered him the option of treating it with stimulants.  He agreed and has obtained substantial benefit from the Concerta especially after increasing from 27 mg up to 36 mg daily.  He has retired now but still needs the Concerta to help with concentration and focus and productivity at home.  Discussed potential benefits, risks, and side effects of stimulants with patient to include increased heart rate, palpitations, insomnia, increased anxiety, increased irritability, or decreased appetite.  Instructed patient to contact office if experiencing any significant tolerability issues.  He'd like to pursue an increase in Concerta due to significant concerns over work performance and lack of benefit with the increase in Wellbutrin.  Disc risk of mania.  Continue Wellbutrin to 300 mg daily and  Concerta 36 mg daily.  If there is any jitteriness reduce the Wellbutrin again 150 mg daily.  If there is still side effects call.  Limit caffeine for tremor and tolerability of stimulant.  His tremor is worse when he drinks excessive caffeine.  He is aware of the link.  Continue lithium 600 mg daily and quetiapine 600 mg nightly for bipolar depression.  Discussed potential metabolic side effects associated with atypical antipsychotics, as well as potential risk for movement side effects. Advised pt to contact office if movement side effects occur.   Counseled patient regarding potential benefits, risks, and  side effects of lithium to include potential risk of lithium affecting thyroid and renal function.  Discussed need for periodic lab monitoring to determine drug level and to assess for potential adverse effects.  Counseled patient regarding signs and symptoms of lithium toxicity and advised that they notify office immediately or seek urgent medical attention if experiencing these signs and symptoms.  Patient advised to contact office with any questions  or concerns.  Recommend consistent use of propranolol before work to help with the tremor.  He has not done that but it is his choice.  Lithium level is in the low normal range because that is the most he can tolerate.  His vitamin D level was 36 and consideration could be given to increasing vitamin D.  No changes today  FU 3 mos  Lynder Parents, MD, DFAPA   No future appointments.  No orders of the defined types were placed in this encounter.     -------------------------------

## 2018-06-05 NOTE — Telephone Encounter (Signed)
Left pt. A VM to return my call.

## 2018-06-05 NOTE — Telephone Encounter (Signed)
Spoke with pt. He was able to get his early refill.

## 2018-07-06 ENCOUNTER — Other Ambulatory Visit: Payer: Self-pay | Admitting: Psychiatry

## 2018-07-06 DIAGNOSIS — F900 Attention-deficit hyperactivity disorder, predominantly inattentive type: Secondary | ICD-10-CM

## 2018-07-06 DIAGNOSIS — F319 Bipolar disorder, unspecified: Secondary | ICD-10-CM

## 2018-07-11 ENCOUNTER — Other Ambulatory Visit: Payer: Self-pay | Admitting: Psychiatry

## 2018-07-11 ENCOUNTER — Telehealth: Payer: Self-pay | Admitting: Psychiatry

## 2018-07-11 DIAGNOSIS — F900 Attention-deficit hyperactivity disorder, predominantly inattentive type: Secondary | ICD-10-CM

## 2018-07-11 DIAGNOSIS — F319 Bipolar disorder, unspecified: Secondary | ICD-10-CM

## 2018-07-11 NOTE — Telephone Encounter (Signed)
Left voicemail to clarify with pt, his pharmacy/insurance is trying to run 27 mg Concerta but last rx states 36 mg, need to know which one to submit for.

## 2018-07-11 NOTE — Telephone Encounter (Signed)
[facility] is trying to fill old dose of 27 mg, will send new rx for 36 mg. Pt is taking 36 mg.

## 2018-07-11 NOTE — Telephone Encounter (Signed)
Pt is waiting for PA of his Concerta. Stated it was submitted last week and would like to know the status. Stated he has been without for a couple of days

## 2018-07-13 ENCOUNTER — Other Ambulatory Visit: Payer: Self-pay

## 2018-07-13 MED ORDER — LITHIUM CARBONATE 300 MG PO CAPS: 300.0000 mg | ORAL_CAPSULE | Freq: Two times a day (BID) | ORAL | 0 refills | Status: DC

## 2018-07-13 NOTE — Telephone Encounter (Signed)
Prior authorization approved for 36 mg effective 07/12/2018 thru 01/04/2019

## 2018-07-20 ENCOUNTER — Other Ambulatory Visit: Payer: Self-pay

## 2018-07-20 ENCOUNTER — Telehealth: Payer: Self-pay | Admitting: Psychiatry

## 2018-07-20 MED ORDER — BUPROPION HCL ER (XL) 150 MG PO TB24: 300.0000 mg | ORAL_TABLET | Freq: Every day | ORAL | 5 refills | Status: DC

## 2018-07-20 NOTE — Telephone Encounter (Signed)
Pt states he is on Concerta a 4 tier med that requires a $90 copay. He would like you to consider a alternative med that would be cheaper in price.

## 2018-07-21 ENCOUNTER — Telehealth: Payer: Self-pay | Admitting: Psychiatry

## 2018-07-21 ENCOUNTER — Other Ambulatory Visit: Payer: Self-pay | Admitting: Psychiatry

## 2018-07-21 NOTE — Telephone Encounter (Signed)
Please send in Seroquel 300mg   Refill to [facility]

## 2018-07-21 NOTE — Telephone Encounter (Signed)
Refill submitted. 

## 2018-07-27 ENCOUNTER — Other Ambulatory Visit: Payer: Self-pay | Admitting: Psychiatry

## 2018-07-27 MED ORDER — METHYLPHENIDATE HCL ER 20 MG PO TBCR: 20.0000 mg | EXTENDED_RELEASE_TABLET | Freq: Every day | ORAL | 0 refills | Status: DC

## 2018-07-27 NOTE — Telephone Encounter (Signed)
There is no other med that is equivalent.  Will send in Rx for Ritalin SR 20 mg in am.  He will need 3 weeks to adjust to the change and may lose benefit.

## 2018-07-28 NOTE — Telephone Encounter (Signed)
Pt. Made aware and verbalized understanding.

## 2018-07-31 ENCOUNTER — Telehealth: Payer: Self-pay | Admitting: Psychiatry

## 2018-07-31 ENCOUNTER — Other Ambulatory Visit: Payer: Self-pay | Admitting: Psychiatry

## 2018-07-31 NOTE — Telephone Encounter (Signed)
Pt is requesting Methylphenidate  NON ER due to lower copay.  The ER version is $90 copay, but can get generic nonER for $15. Can you please refill the non- ER for him?

## 2018-07-31 NOTE — Telephone Encounter (Signed)
I sent in the lower dose of the non-Concerta last week.  He needs to fill the prescription and try it

## 2018-08-01 ENCOUNTER — Other Ambulatory Visit: Payer: Self-pay | Admitting: Psychiatry

## 2018-08-01 MED ORDER — METHYLPHENIDATE HCL 10 MG PO TABS: 10.0000 mg | ORAL_TABLET | Freq: Three times a day (TID) | ORAL | 0 refills | Status: DC

## 2018-08-01 NOTE — Telephone Encounter (Signed)
I spoke with the patient and he stated that his insurance will not pay for it so he is unable to pick it up due to cost. They will pay for the regular Methylphenidate. Can you please send in as the regular and not the ER?

## 2018-08-01 NOTE — Telephone Encounter (Signed)
Sent and Ritalin 10 mg 3 times daily.  Best the cheapest there is.  Let us know if there is a problem.

## 2018-08-02 NOTE — Telephone Encounter (Signed)
Pt. Made aware and verbalized understanding.

## 2018-09-01 ENCOUNTER — Ambulatory Visit: Payer: 59 | Admitting: Psychiatry

## 2018-10-03 ENCOUNTER — Other Ambulatory Visit: Payer: Self-pay | Admitting: Psychiatry

## 2018-10-03 NOTE — Telephone Encounter (Signed)
appt tomorrow 09/30

## 2018-10-04 ENCOUNTER — Other Ambulatory Visit: Payer: Self-pay | Admitting: Psychiatry

## 2018-10-04 ENCOUNTER — Encounter: Payer: Self-pay | Admitting: Psychiatry

## 2018-10-04 ENCOUNTER — Encounter: Payer: PPO | Admitting: Psychiatry

## 2018-10-04 ENCOUNTER — Other Ambulatory Visit: Payer: Self-pay

## 2018-10-04 MED ORDER — METHYLPHENIDATE HCL 10 MG PO TABS: 10.0000 mg | ORAL_TABLET | Freq: Three times a day (TID) | ORAL | 0 refills | Status: DC

## 2018-10-04 NOTE — Progress Notes (Signed)
Prescription for Ritalin sent again apparently the one sent earlier today failed

## 2018-10-05 ENCOUNTER — Other Ambulatory Visit: Payer: Self-pay

## 2018-10-05 ENCOUNTER — Encounter: Payer: Self-pay | Admitting: Psychiatry

## 2018-10-05 ENCOUNTER — Ambulatory Visit (INDEPENDENT_AMBULATORY_CARE_PROVIDER_SITE_OTHER): Payer: PPO | Admitting: Psychiatry

## 2018-10-05 DIAGNOSIS — F319 Bipolar disorder, unspecified: Secondary | ICD-10-CM

## 2018-10-05 DIAGNOSIS — G251 Drug-induced tremor: Secondary | ICD-10-CM | POA: Diagnosis not present

## 2018-10-05 DIAGNOSIS — F900 Attention-deficit hyperactivity disorder, predominantly inattentive type: Secondary | ICD-10-CM | POA: Diagnosis not present

## 2018-10-05 DIAGNOSIS — F5105 Insomnia due to other mental disorder: Secondary | ICD-10-CM

## 2018-10-05 DIAGNOSIS — R7989 Other specified abnormal findings of blood chemistry: Secondary | ICD-10-CM

## 2018-10-05 DIAGNOSIS — F411 Generalized anxiety disorder: Secondary | ICD-10-CM | POA: Diagnosis not present

## 2018-10-05 NOTE — Progress Notes (Signed)
Brian Guerrero [id number] [birth year] 68 y.o.   Virtual Visit via [place]  I connected with pt by Webex and verified that I am speaking with the correct person using two identifiers.   I discussed the limitations, risks, security and privacy concerns of performing an evaluation and management service by Webex and the availability of in person appointments. I also discussed with the patient that there may be a patient responsible charge related to this service. The patient expressed understanding and agreed to proceed.  I discussed the assessment and treatment plan with the patient. The patient was provided an opportunity to ask questions and all were answered. The patient agreed with the plan and demonstrated an understanding of the instructions.   The patient was advised to call back or seek an in-person evaluation if the symptoms worsen or if the condition fails to improve as anticipated.  I provided 25 minutes of non-face-to-face time during this encounter. The call started at 900  and ended at 925. The patient was located at home and the provider was located office.   Subjective:   Patient ID:  Brian Guerrero is a 68 y.o. (DOB March 20, 1950) male.  Chief Complaint:  Chief Complaint  Patient presents with  . Follow-up    Medication Management  . ADHD    Medication Management  . Anxiety    Medication Management    Depression        Associated symptoms include no decreased concentration and no suicidal ideas.  Past medical history includes anxiety.   Anxiety Patient reports no confusion, decreased concentration, nervous/anxious behavior or suicidal ideas.      Brian Guerrero presents to the office today for follow-up of med change.  At visit  March 07, 2018.  For problems with attention and focus Concerta was increased from 27 mg a day to 30 mg a day.  In January we reduced the Wellbutrin and added Concerta 27.  More stamina and more alert but still struggle with concentration  and memory and tremor.  Can't say if it's new or a lifelong thing.  Tolerating the Concerta OK.  Thinks he could tolerate an increase in the Concerta.  Occ propranolol for tremor.  Last seen Jun 02, 2018.  No meds were changed. He had received benefit from the increase Concerta given in March.  Has stopped Concerta bc cost prohibitive.  Taking instead Ritalin and can't remember it TID.  Does get benefit but liked Concerta better.  Overall good and likes retirement.  Was too stressed at work before..  Tremors resolved.  Doing hobbies and travel.  Brian Guerrero concerned about episode of mania she noticed in May.  Reduced sleep, faster driving, spending and talking faster.  Not more severe.  Lasted a few weeks and resolved after switch to Ritalin.  She doesn't see this now.  He didn't think it lasted more than a few days.  She thinks he didn't recognize it.  It was not severe.  Patient reports stable mood and denies depressed or irritable moods.  Patient denies any recent difficulty with anxiety.  Patient denies difficulty with sleep initiation or maintenance. Denies appetite disturbance but he's had 20# weight loss. He's getting less sugar in retirement and stays active.  Patient reports that energy and motivation have been good.  Patient denies any difficulty with concentration.  Patient denies any suicidal ideation.  Sleep 8 hours.  Not oversleeping.  Retired couple weeks ago at 68 yo. Feels good about it.  Enjoying it  so far.  Working on new schedule.  Tackling some home projects. Motivation is better.  No depression.    Past Psychiatric Medication Trials: Concerta 36,     Review of Systems:  Review of Systems  Neurological: Negative for tremors and weakness.  Psychiatric/Behavioral: Positive for depression. Negative for agitation, behavioral problems, confusion, decreased concentration, dysphoric mood, hallucinations, self-injury, sleep disturbance and suicidal ideas. The patient is not nervous/anxious  and is not hyperactive.   tremor variable worse with caffeine  Medications: I have reviewed the patient's current medications.  Current Outpatient Medications  Medication Sig Dispense Refill  . Acetylcysteine (NAC 600 PO) Take 1,200 mg by mouth daily.    [name] aspirin EC 81 MG tablet Take 81 mg by mouth daily.    [name] buPROPion (WELLBUTRIN XL) 150 MG 24 hr tablet Take 2 tablets (300 mg total) by mouth daily. 60 tablet 5  . Carnitine-B5-B6 (L-CARNITINE 500 PO) Take 1,000 mg by mouth daily.    . finasteride (PROSCAR) 5 MG tablet Take 5 mg by mouth daily.    [name] lithium carbonate 300 MG capsule Take 1 capsule (300 mg total) by mouth 2 (two) times daily. 180 capsule 0  . methylphenidate (RITALIN) 10 MG tablet Take 1 tablet (10 mg total) by mouth 3 (three) times daily with meals. 90 tablet 0  . propranolol (INDERAL) 20 MG tablet Take 20 mg by mouth daily.    . Pyridoxine HCl (VITAMIN B6) 100 MG TABS Take 100 mg by mouth daily.    . QUEtiapine (SEROQUEL) 300 MG tablet TAKE 2 TABLETS BY MOUTH AT BEDTIME. 180 tablet 0   No current facility-administered medications for this visit.     Medication Side Effect:  CO sluggish in the morning. Tremor resolved.  Allergies:  Allergies  Allergen Reactions  . Lincomycin Hcl     As a child/had a hive    Past Medical History:  Diagnosis Date  . Attention deficit disorder 10/19/2017   per pt/ not aware of having ADD  . BPH (benign prostatic hyperplasia)   . Depression with anxiety   . Diverticular disease    [facility]/per pt not aware of this  . Varicose veins   . Wears glasses     Family History  Problem Relation Age of Onset  . Depression Mother   . Prostate cancer Father   . Kidney disease Father     Social History   Socioeconomic History  . Marital status: Married    Spouse name: Not on file  . Number of children: Not on file  . Years of education: Not on file  . Highest education level: Not on file  Occupational History  . Not on  file  Social Needs  . Financial resource strain: Not on file  . Food insecurity    Worry: Not on file    Inability: Not on file  . Transportation needs    Medical: Not on file    Non-medical: Not on file  Tobacco Use  . Smoking status: Never Smoker  . Smokeless tobacco: Never Used  Substance and Sexual Activity  . Alcohol use: Yes    Alcohol/week: 2.0 standard drinks    Types: 2 Standard drinks or equivalent per week  . Drug use: Not Currently  . Sexual activity: Not on file  Lifestyle  . Physical activity    Days per week: Not on file    Minutes per session: Not on file  . Stress: Not on file  Relationships  .  Social Herbalist on phone: Not on file    Gets together: Not on file    Attends religious service: Not on file    Active member of club or organization: Not on file    Attends meetings of clubs or organizations: Not on file    Relationship status: Not on file  . Intimate partner violence    Fear of current or ex partner: Not on file    Emotionally abused: Not on file    Physically abused: Not on file    Forced sexual activity: Not on file  Other Topics Concern  . Not on file  Social History Narrative  . Not on file    Past Medical History, Surgical history, Social history, and Family history were reviewed and updated as appropriate.   Please see review of systems for further details on the patient's review from today.   Objective:   Physical Exam:  There were no vitals taken for this visit.  Physical Exam Neurological:     Mental Status: He is alert and oriented to person, place, and time.     Cranial Nerves: No dysarthria.  Psychiatric:        Attention and Perception: Attention normal.        Mood and Affect: Mood is not anxious or depressed.        Speech: Speech normal.        Behavior: Behavior is cooperative.        Thought Content: Thought content normal. Thought content is not paranoid or delusional. Thought content does not  include homicidal or suicidal ideation. Thought content does not include homicidal or suicidal plan.        Cognition and Memory: Cognition and memory normal.        Judgment: Judgment normal.     Comments: Insight fair.  His mood is improved and his anxiety is improved with the increase in Concerta.     Lab Review:     Component Value Date/Time   NA 140 04/26/2013 0930   K 4.7 04/26/2013 0930   CL 105 04/26/2013 0930   CO2 25 04/26/2013 0930   GLUCOSE 93 04/26/2013 0930   BUN 21 04/26/2013 0930   CREATININE 0.85 04/26/2013 0930   CALCIUM 9.1 04/26/2013 0930   GFRNONAA >90 04/26/2013 0930   GFRAA >90 04/26/2013 0930       Component Value Date/Time   WBC 6.4 04/26/2013 0930   RBC 4.69 04/26/2013 0930   HGB 14.6 04/26/2013 0930   HCT 42.3 04/26/2013 0930   PLT 266 04/26/2013 0930   MCV 90.2 04/26/2013 0930   MCH 31.1 04/26/2013 0930   MCHC 34.5 04/26/2013 0930   RDW 13.4 04/26/2013 0930   LYMPHSABS 1.2 04/26/2013 0930   MONOABS 0.7 04/26/2013 0930   EOSABS 0.3 04/26/2013 0930   BASOSABS 0.0 04/26/2013 0930    Lithium Lvl  Date Value Ref Range Status  01/11/2018 0.5 (L) 0.6 - 1.2 mmol/L Final    Comment:                                     Detection Limit = 0.1                           <0.1 indicates None Detected      No results found for: PHENYTOIN, PHENOBARB, VALPROATE,  CBMZ   .res Assessment: Plan:    Bipolar I disorder ([place])  Attention deficit hyperactivity disorder (ADHD), predominantly inattentive type  Lithium-induced tremor  Generalized anxiety disorder  Low vitamin D level  Insomnia due to mental condition   I believe that his problems with concentration are related to a type of acquired ADD due to the consequence of chronic bipolar depression with inadequate response to typical bipolar disorder medications as well as Wellbutrin.  Since this concentration problem was serious enough to be affecting his job function and potentially forcing  retirement I offered him the option of treating it with stimulants.  He agreed and has obtained substantial benefit from the Concerta especially after increasing from 27 mg up to 36 mg daily.  He has retired now but still needs the Concerta to help with concentration and focus and productivity at home.   Concerta helped and he liked it but he had to switch to Ritalin 10 mg 3 times daily because of cost.  His wife felt like he was a little manic on Concerta and that has resolved with the switch to Ritalin.  Discussed the risk that stimulants can cause mania.  He did not really really recognize the mania but his wife did and he did respond to his wife's concerns.  Discussed potential benefits, risks, and side effects of stimulants with patient to include increased heart rate, palpitations, insomnia, increased anxiety, increased irritability, or decreased appetite.  Instructed patient to contact office if experiencing any significant tolerability issues.    Continue Wellbutrin to 300 mg daily and  Ritalin 10 TID mg daily.  If there is any jitteriness reduce the Wellbutrin again 150 mg daily.  If there is still side effects call.  Tremor related to medications have resolved since he retired for reasons that are not clear.  Continue lithium 600 mg daily and quetiapine 600 mg nightly for bipolar depression. Option increase lithium and decrease quetiapine now that the tremor has resolved and he wants to increase or decrease quetiapine if possible.  No med changes today  Discussed potential metabolic side effects associated with atypical antipsychotics, as well as potential risk for movement side effects. Advised pt to contact office if movement side effects occur.   Counseled patient regarding potential benefits, risks, and side effects of lithium to include potential risk of lithium affecting thyroid and renal function.  Discussed need for periodic lab monitoring to determine drug level and to assess for  potential adverse effects.  Counseled patient regarding signs and symptoms of lithium toxicity and advised that they notify office immediately or seek urgent medical attention if experiencing these signs and symptoms.  Patient advised to contact office with any questions or concerns.  Recommend consistent use of propranolol before work to help with the tremor.  He has not done that but it is his choice.  Lithium level is in the low normal range because that is the most he can tolerate.  His vitamin D level was 36 and consideration could be given to increasing vitamin D. FU 3 mos  Lynder Parents, MD, DFAPA   No future appointments.  No orders of the defined types were placed in this encounter.     -------------------------------

## 2018-10-18 ENCOUNTER — Telehealth: Payer: Self-pay

## 2018-10-18 NOTE — Telephone Encounter (Signed)
Prior authorization submitted and approved for Methylphenidate 10 mg effective 10/09/2018-10/09/2019 through [facility]

## 2018-10-31 ENCOUNTER — Other Ambulatory Visit: Payer: Self-pay | Admitting: Psychiatry

## 2018-11-17 ENCOUNTER — Other Ambulatory Visit: Payer: Self-pay | Admitting: Psychiatry

## 2018-11-19 NOTE — Telephone Encounter (Signed)
Last apt 10/21

## 2018-12-04 NOTE — Progress Notes (Signed)
This encounter was created in error - please disregard.

## 2018-12-20 ENCOUNTER — Other Ambulatory Visit: Payer: Self-pay | Admitting: Psychiatry

## 2019-01-11 ENCOUNTER — Other Ambulatory Visit: Payer: Self-pay | Admitting: Psychiatry

## 2019-01-22 DIAGNOSIS — M79672 Pain in left foot: Secondary | ICD-10-CM | POA: Diagnosis not present

## 2019-01-22 DIAGNOSIS — M79671 Pain in right foot: Secondary | ICD-10-CM | POA: Diagnosis not present

## 2019-01-22 DIAGNOSIS — B353 Tinea pedis: Secondary | ICD-10-CM | POA: Diagnosis not present

## 2019-01-22 DIAGNOSIS — B351 Tinea unguium: Secondary | ICD-10-CM | POA: Diagnosis not present

## 2019-01-22 DIAGNOSIS — L600 Ingrowing nail: Secondary | ICD-10-CM | POA: Diagnosis not present

## 2019-02-10 DIAGNOSIS — Z03818 Encounter for observation for suspected exposure to other biological agents ruled out: Secondary | ICD-10-CM | POA: Diagnosis not present

## 2019-02-10 DIAGNOSIS — Z20828 Contact with and (suspected) exposure to other viral communicable diseases: Secondary | ICD-10-CM | POA: Diagnosis not present

## 2019-03-15 ENCOUNTER — Other Ambulatory Visit: Payer: Self-pay | Admitting: Psychiatry

## 2019-03-16 NOTE — Telephone Encounter (Signed)
Next apt 04/27

## 2019-03-21 DIAGNOSIS — M79675 Pain in left toe(s): Secondary | ICD-10-CM | POA: Diagnosis not present

## 2019-03-21 DIAGNOSIS — B351 Tinea unguium: Secondary | ICD-10-CM | POA: Diagnosis not present

## 2019-03-21 DIAGNOSIS — M79674 Pain in right toe(s): Secondary | ICD-10-CM | POA: Diagnosis not present

## 2019-03-21 DIAGNOSIS — L600 Ingrowing nail: Secondary | ICD-10-CM | POA: Diagnosis not present

## 2019-03-23 DIAGNOSIS — E291 Testicular hypofunction: Secondary | ICD-10-CM | POA: Diagnosis not present

## 2019-03-23 DIAGNOSIS — R972 Elevated prostate specific antigen [PSA]: Secondary | ICD-10-CM | POA: Diagnosis not present

## 2019-03-23 DIAGNOSIS — N5201 Erectile dysfunction due to arterial insufficiency: Secondary | ICD-10-CM | POA: Diagnosis not present

## 2019-03-27 DIAGNOSIS — H40003 Preglaucoma, unspecified, bilateral: Secondary | ICD-10-CM | POA: Diagnosis not present

## 2019-03-27 DIAGNOSIS — H25811 Combined forms of age-related cataract, right eye: Secondary | ICD-10-CM | POA: Diagnosis not present

## 2019-03-27 DIAGNOSIS — Z01818 Encounter for other preprocedural examination: Secondary | ICD-10-CM | POA: Diagnosis not present

## 2019-03-27 DIAGNOSIS — H25812 Combined forms of age-related cataract, left eye: Secondary | ICD-10-CM | POA: Diagnosis not present

## 2019-03-29 ENCOUNTER — Other Ambulatory Visit: Payer: Self-pay | Admitting: Psychiatry

## 2019-03-31 ENCOUNTER — Ambulatory Visit: Payer: PPO | Attending: Internal Medicine

## 2019-03-31 DIAGNOSIS — Z23 Encounter for immunization: Secondary | ICD-10-CM

## 2019-03-31 NOTE — Progress Notes (Signed)
   Covid-19 Vaccination Clinic  Name:  Brian Guerrero    MRN: [id number] DOB: [birth year]  03/31/2019  [name] was observed post Covid-19 immunization for 15 minutes without incident. He was provided with Vaccine Information Sheet and instruction to access the V-Safe system.   [name] was instructed to call 911 with any severe reactions post vaccine: [name] Difficulty breathing  . Swelling of face and throat  . A fast heartbeat  . A bad rash all over body  . Dizziness and weakness   Immunizations Administered    Name Date Dose VIS Date Route   Pfizer COVID-19 Vaccine 03/31/2019 12:08 PM 0.3 mL 12/15/2018 Intramuscular   Manufacturer: [place]   Lot: [id number]   [place]: [id number]

## 2019-05-01 ENCOUNTER — Ambulatory Visit: Payer: PPO | Admitting: Psychiatry

## 2019-05-02 ENCOUNTER — Ambulatory Visit: Payer: PPO

## 2019-05-07 ENCOUNTER — Ambulatory Visit: Payer: PPO

## 2019-05-08 ENCOUNTER — Ambulatory Visit: Payer: PPO | Attending: Internal Medicine

## 2019-05-08 DIAGNOSIS — Z23 Encounter for immunization: Secondary | ICD-10-CM

## 2019-05-08 NOTE — Progress Notes (Signed)
   Covid-19 Vaccination Clinic  Name:  Brian Guerrero    MRN: [id number] DOB: [birth year]  05/08/2019  Brian Guerrero was observed post Covid-19 immunization for 15 minutes without incident. He was provided with Vaccine Information Sheet and instruction to access the V-Safe system.   Brian Guerrero was instructed to call 911 with any severe reactions post vaccine: [name] Difficulty breathing  . Swelling of face and throat  . A fast heartbeat  . A bad rash all over body  . Dizziness and weakness   Immunizations Administered    Name Date Dose VIS Date Route   Pfizer COVID-19 Vaccine 05/08/2019  8:31 AM 0.3 mL 02/28/2018 Intramuscular   Manufacturer: [place]   Lot: [id number]   [place]: [id number]

## 2019-05-10 DIAGNOSIS — H25811 Combined forms of age-related cataract, right eye: Secondary | ICD-10-CM | POA: Diagnosis not present

## 2019-05-10 DIAGNOSIS — H2511 Age-related nuclear cataract, right eye: Secondary | ICD-10-CM | POA: Diagnosis not present

## 2019-05-14 DIAGNOSIS — M79675 Pain in left toe(s): Secondary | ICD-10-CM | POA: Diagnosis not present

## 2019-05-14 DIAGNOSIS — M79674 Pain in right toe(s): Secondary | ICD-10-CM | POA: Diagnosis not present

## 2019-05-14 DIAGNOSIS — B351 Tinea unguium: Secondary | ICD-10-CM | POA: Diagnosis not present

## 2019-05-14 DIAGNOSIS — L600 Ingrowing nail: Secondary | ICD-10-CM | POA: Diagnosis not present

## 2019-05-16 ENCOUNTER — Other Ambulatory Visit: Payer: Self-pay | Admitting: Psychiatry

## 2019-05-16 ENCOUNTER — Encounter: Payer: Self-pay | Admitting: Psychiatry

## 2019-05-16 ENCOUNTER — Other Ambulatory Visit: Payer: Self-pay

## 2019-05-16 ENCOUNTER — Ambulatory Visit (INDEPENDENT_AMBULATORY_CARE_PROVIDER_SITE_OTHER): Payer: PPO | Admitting: Psychiatry

## 2019-05-16 DIAGNOSIS — F411 Generalized anxiety disorder: Secondary | ICD-10-CM

## 2019-05-16 DIAGNOSIS — F5105 Insomnia due to other mental disorder: Secondary | ICD-10-CM | POA: Diagnosis not present

## 2019-05-16 DIAGNOSIS — G251 Drug-induced tremor: Secondary | ICD-10-CM

## 2019-05-16 DIAGNOSIS — Z79899 Other long term (current) drug therapy: Secondary | ICD-10-CM | POA: Diagnosis not present

## 2019-05-16 DIAGNOSIS — F900 Attention-deficit hyperactivity disorder, predominantly inattentive type: Secondary | ICD-10-CM | POA: Diagnosis not present

## 2019-05-16 DIAGNOSIS — F319 Bipolar disorder, unspecified: Secondary | ICD-10-CM | POA: Diagnosis not present

## 2019-05-16 DIAGNOSIS — R7989 Other specified abnormal findings of blood chemistry: Secondary | ICD-10-CM

## 2019-05-16 MED ORDER — LITHIUM CARBONATE 300 MG PO CAPS: 300.0000 mg | ORAL_CAPSULE | Freq: Two times a day (BID) | ORAL | 3 refills | Status: DC

## 2019-05-16 MED ORDER — METHYLPHENIDATE HCL 10 MG PO TABS: 10.0000 mg | ORAL_TABLET | Freq: Three times a day (TID) | ORAL | 0 refills | Status: DC

## 2019-05-16 MED ORDER — BUPROPION HCL ER (XL) 150 MG PO TB24: 300.0000 mg | ORAL_TABLET | Freq: Every day | ORAL | 1 refills | Status: DC

## 2019-05-16 MED ORDER — QUETIAPINE FUMARATE 300 MG PO TABS: 600.0000 mg | ORAL_TABLET | Freq: Every day | ORAL | 1 refills | Status: DC

## 2019-05-16 NOTE — Progress Notes (Signed)
DAE TARAS WE:[id number] 09/25/1950 69 y.o.     Subjective:   Patient ID:  Brian Guerrero is a 69 y.o. (DOB 1950/03/01) male.  Chief Complaint:  Chief Complaint  Patient presents with  . Follow-up    anxiety and mood  . ADHD    Depression        Associated symptoms include no decreased concentration and no suicidal ideas.  Past medical history includes anxiety.   Anxiety Patient reports no confusion, decreased concentration, nervous/anxious behavior, palpitations or suicidal ideas.      Brian Guerrero presents to the office today for follow-up of med change.  At visit  March 07, 2018.  For problems with attention and focus Concerta was increased from 27 mg a day to 30 mg a day.  In January we reduced the Wellbutrin and added Concerta 27.  More stamina and more alert but still struggle with concentration and memory and tremor.  Can't say if it's new or a lifelong thing.  Tolerating the Concerta OK.  Thinks he could tolerate an increase in the Concerta.  Occ propranolol for tremor.  seen Jun 02, 2018.  No meds were changed. He had received benefit from the increase Concerta given in March.  Last seen 10/05/18.  Has stopped Concerta bc cost prohibitive.  Taking instead Ritalin and can't remember it TID.  Does get benefit but liked Concerta better.  Overall good and likes retirement.  Was too stressed at work before..  Tremors resolved.  Doing hobbies and travel. [name] concerned about episode of mania she noticed in May.  Reduced sleep, faster driving, spending and talking faster.  Not more severe.  Lasted a few weeks and resolved after switch to Ritalin.  She doesn't see this now.  He didn't think it lasted more than a few days.  She thinks he didn't recognize it.  It was not severe. No med changes.  05/16/19 appt noted.\ Doing well with mood.  Liked Concerta better and hard to take 3 daily of Ritalin.  Otherwise no med changes.  Cataract surgery helped. Enjoyed  retirements.  Patient reports stable mood and denies depressed or irritable moods.  Patient denies any recent difficulty with anxiety. Rare anxiety.   Patient denies difficulty with sleep initiation or maintenance. Denies appetite disturbance but he's had 20# weight loss. He's getting less sugar in retirement and stays active.  Patient reports that energy and motivation have been good.  Patient denies any difficulty with concentration.  Patient denies any suicidal ideation.  Sleep 8 hours.  Not oversleeping.  Retired at 69 yo. Feels good about it.  Enjoying it so far.  Working on new schedule.  Tackling some home projects. Motivation is better.  No depression.    Past Psychiatric Medication Trials: Concerta 36,  Ritalin 10 TID   Review of Systems:  Review of Systems  Cardiovascular: Negative for palpitations.  Neurological: Negative for tremors and weakness.  Psychiatric/Behavioral: Positive for depression. Negative for agitation, behavioral problems, confusion, decreased concentration, dysphoric mood, hallucinations, self-injury, sleep disturbance and suicidal ideas. The patient is not nervous/anxious and is not hyperactive.   tremor variable worse with caffeine  Medications: I have reviewed the patient's current medications.  Current Outpatient Medications  Medication Sig Dispense Refill  . Acetylcysteine (NAC 600 PO) Take 1,200 mg by mouth daily.    [name] aspirin EC 81 MG tablet Take 81 mg by mouth daily.    [name] buPROPion (WELLBUTRIN XL) 150 MG 24 hr tablet Take 2  tablets (300 mg total) by mouth daily. 90 tablet 1  . Carnitine-B5-B6 (L-CARNITINE 500 PO) Take 1,000 mg by mouth daily.    . finasteride (PROSCAR) 5 MG tablet Take 5 mg by mouth daily.    [name] lithium carbonate 300 MG capsule Take 1 capsule (300 mg total) by mouth 2 (two) times daily. 180 capsule 3  . methylphenidate (RITALIN) 10 MG tablet Take 1 tablet (10 mg total) by mouth 3 (three) times daily with meals. 270 tablet 0  .  propranolol (INDERAL) 20 MG tablet Take 20 mg by mouth daily.    . Pyridoxine HCl (VITAMIN B6) 100 MG TABS Take 100 mg by mouth daily.    . QUEtiapine (SEROQUEL) 300 MG tablet Take 2 tablets (600 mg total) by mouth at bedtime. 180 tablet 1   No current facility-administered medications for this visit.    Medication Side Effect:  CO sluggish in the morning. Tremor resolved.  Allergies:  Allergies  Allergen Reactions  . Lincomycin Hcl     As a child/had a hive    Past Medical History:  Diagnosis Date  . Attention deficit disorder 10/19/2017   per pt/ not aware of having ADD  . BPH (benign prostatic hyperplasia)   . Depression with anxiety   . Diverticular disease    [facility]/per pt not aware of this  . Varicose veins   . Wears glasses     Family History  Problem Relation Age of Onset  . Depression Mother   . Prostate cancer Father   . Kidney disease Father     Social History   Socioeconomic History  . Marital status: Married    Spouse name: Not on file  . Number of children: Not on file  . Years of education: Not on file  . Highest education level: Not on file  Occupational History  . Not on file  Tobacco Use  . Smoking status: Never Smoker  . Smokeless tobacco: Never Used  Substance and Sexual Activity  . Alcohol use: Yes    Alcohol/week: 2.0 standard drinks    Types: 2 Standard drinks or equivalent per week  . Drug use: Not Currently  . Sexual activity: Not on file  Other Topics Concern  . Not on file  Social History Narrative  . Not on file   Social Determinants of Health   Financial Resource Strain:   . Difficulty of Paying Living Expenses:   Food Insecurity:   . Worried About Charity fundraiser in the Last Year:   . Arboriculturist in the Last Year:   Transportation Needs:   . Film/video editor (Medical):   [name] Lack of Transportation (Non-Medical):   Physical Activity:   . Days of Exercise per Week:   . Minutes of Exercise per Session:    Stress:   . Feeling of Stress :   Social Connections:   . Frequency of Communication with Friends and Family:   . Frequency of Social Gatherings with Friends and Family:   . Attends Religious Services:   . Active Member of Clubs or Organizations:   . Attends Archivist Meetings:   [name] Marital Status:   Intimate Partner Violence:   . Fear of Current or Ex-Partner:   . Emotionally Abused:   [name] Physically Abused:   . Sexually Abused:     Past Medical History, Surgical history, Social history, and Family history were reviewed and updated as appropriate.   Please see review  of systems for further details on the patient's review from today.   Objective:   Physical Exam:  There were no vitals taken for this visit.  Physical Exam Neurological:     Mental Status: He is alert and oriented to person, place, and time.     Cranial Nerves: No dysarthria.  Psychiatric:        Attention and Perception: Attention normal.        Mood and Affect: Mood is not anxious or depressed.        Speech: Speech normal.        Behavior: Behavior is not slowed. Behavior is cooperative.        Thought Content: Thought content normal. Thought content is not paranoid or delusional. Thought content does not include homicidal or suicidal ideation. Thought content does not include homicidal or suicidal plan.        Cognition and Memory: Cognition and memory normal.        Judgment: Judgment normal.     Comments: Insight fair.  His mood is improved and his anxiety is improved with the increase in Concerta.     Lab Review:     Component Value Date/Time   NA 140 04/26/2013 0930   K 4.7 04/26/2013 0930   CL 105 04/26/2013 0930   CO2 25 04/26/2013 0930   GLUCOSE 93 04/26/2013 0930   BUN 21 04/26/2013 0930   CREATININE 0.85 04/26/2013 0930   CALCIUM 9.1 04/26/2013 0930   GFRNONAA >90 04/26/2013 0930   GFRAA >90 04/26/2013 0930       Component Value Date/Time   WBC 6.4 04/26/2013 0930   RBC  4.69 04/26/2013 0930   HGB 14.6 04/26/2013 0930   HCT 42.3 04/26/2013 0930   PLT 266 04/26/2013 0930   MCV 90.2 04/26/2013 0930   MCH 31.1 04/26/2013 0930   MCHC 34.5 04/26/2013 0930   RDW 13.4 04/26/2013 0930   LYMPHSABS 1.2 04/26/2013 0930   MONOABS 0.7 04/26/2013 0930   EOSABS 0.3 04/26/2013 0930   BASOSABS 0.0 04/26/2013 0930    Lithium Lvl  Date Value Ref Range Status  01/11/2018 0.5 (L) 0.6 - 1.2 mmol/L Final    Comment:                                     Detection Limit = 0.1                           <0.1 indicates None Detected      No results found for: PHENYTOIN, PHENOBARB, VALPROATE, CBMZ   .res Assessment: Plan:    Bipolar I disorder ([place]) - Plan: buPROPion (WELLBUTRIN XL) 150 MG 24 hr tablet, lithium carbonate 300 MG capsule, QUEtiapine (SEROQUEL) 300 MG tablet, Basic metabolic panel, Lithium level, TSH  Attention deficit hyperactivity disorder (ADHD), predominantly inattentive type - Plan: buPROPion (WELLBUTRIN XL) 150 MG 24 hr tablet, methylphenidate (RITALIN) 10 MG tablet  Lithium-induced tremor  Low vitamin D level  Generalized anxiety disorder  Insomnia due to mental condition  Lithium use - Plan: Basic metabolic panel, Lithium level, TSH   I believe that his problems with concentration are related to a type of acquired ADD due to the consequence of chronic bipolar depression with inadequate response to typical bipolar disorder medications as well as Wellbutrin.  Since this concentration problem was serious enough to be affecting  his job function and potentially forcing retirement I offered him the option of treating it with stimulants.  He agreed and has obtained substantial benefit from the Concerta especially after increasing from 27 mg up to 36 mg daily.  He has retired now but still needs the Concerta to help with concentration and focus and productivity at home. Disc importance of staying active to manage risk of depression.     Concerta helped  and he liked it but he had to switch to Ritalin 10 mg 3 times daily because of cost.  His wife felt like he was a little manic on Concerta and that has resolved with the switch to Ritalin.  Discussed the risk that stimulants can cause mania.  He did not really really recognize the mania but his wife did and he did respond to his wife's concerns.  Discussed potential benefits, risks, and side effects of stimulants with patient to include increased heart rate, palpitations, insomnia, increased anxiety, increased irritability, or decreased appetite.  Instructed patient to contact office if experiencing any significant tolerability issues.    Continue Wellbutrin to 300 mg daily and  Ritalin 10 TID mg daily.  If there is any jitteriness reduce the Wellbutrin again 150 mg daily.  If there is still side effects call.  Tremor related to medications have resolved since he retired for reasons that are not clear.  Continue lithium 600 mg daily and quetiapine 600 mg nightly for bipolar depression. Option increase lithium and decrease quetiapine now that the tremor has resolved and he wants to increase or decrease quetiapine if possible.  No med changes today  Discussed potential metabolic side effects associated with atypical antipsychotics, as well as potential risk for movement side effects. Advised pt to contact office if movement side effects occur.   Counseled patient regarding potential benefits, risks, and side effects of lithium to include potential risk of lithium affecting thyroid and renal function.  Discussed need for periodic lab monitoring to determine drug level and to assess for potential adverse effects.  Counseled patient regarding signs and symptoms of lithium toxicity and advised that they notify office immediately or seek urgent medical attention if experiencing these signs and symptoms.  Patient advised to contact office with any questions or concerns.  Recommend consistent use of  propranolol before work to help with the tremor.  He has not done that but it is his choice.  Lithium level is in the low normal range because that is the most he can tolerate.  His vitamin D level was 36 and consideration could be given to increasing vitamin D. FU 3 mos  Lynder Parents, MD, DFAPA   No future appointments.  Orders Placed This Encounter  Procedures  . Basic metabolic panel  . Lithium level  . TSH      -------------------------------

## 2019-05-31 DIAGNOSIS — H2512 Age-related nuclear cataract, left eye: Secondary | ICD-10-CM | POA: Diagnosis not present

## 2019-05-31 DIAGNOSIS — H25812 Combined forms of age-related cataract, left eye: Secondary | ICD-10-CM | POA: Diagnosis not present

## 2019-06-06 ENCOUNTER — Other Ambulatory Visit (HOSPITAL_COMMUNITY): Payer: Self-pay | Admitting: Urology

## 2019-07-03 DIAGNOSIS — Z0000 Encounter for general adult medical examination without abnormal findings: Secondary | ICD-10-CM | POA: Diagnosis not present

## 2019-07-03 DIAGNOSIS — Z136 Encounter for screening for cardiovascular disorders: Secondary | ICD-10-CM | POA: Diagnosis not present

## 2019-07-03 DIAGNOSIS — N400 Enlarged prostate without lower urinary tract symptoms: Secondary | ICD-10-CM | POA: Diagnosis not present

## 2019-07-03 DIAGNOSIS — F3181 Bipolar II disorder: Secondary | ICD-10-CM | POA: Diagnosis not present

## 2019-07-03 DIAGNOSIS — G250 Essential tremor: Secondary | ICD-10-CM | POA: Diagnosis not present

## 2019-07-03 DIAGNOSIS — R7309 Other abnormal glucose: Secondary | ICD-10-CM | POA: Diagnosis not present

## 2019-07-03 DIAGNOSIS — B351 Tinea unguium: Secondary | ICD-10-CM | POA: Diagnosis not present

## 2019-07-03 DIAGNOSIS — Z1389 Encounter for screening for other disorder: Secondary | ICD-10-CM | POA: Diagnosis not present

## 2019-07-05 DIAGNOSIS — M79674 Pain in right toe(s): Secondary | ICD-10-CM | POA: Diagnosis not present

## 2019-07-05 DIAGNOSIS — L600 Ingrowing nail: Secondary | ICD-10-CM | POA: Diagnosis not present

## 2019-07-05 DIAGNOSIS — B351 Tinea unguium: Secondary | ICD-10-CM | POA: Diagnosis not present

## 2019-07-05 DIAGNOSIS — M79675 Pain in left toe(s): Secondary | ICD-10-CM | POA: Diagnosis not present

## 2019-08-21 ENCOUNTER — Other Ambulatory Visit: Payer: Self-pay | Admitting: Psychiatry

## 2019-08-21 DIAGNOSIS — F319 Bipolar disorder, unspecified: Secondary | ICD-10-CM

## 2019-08-21 DIAGNOSIS — F900 Attention-deficit hyperactivity disorder, predominantly inattentive type: Secondary | ICD-10-CM

## 2019-08-24 ENCOUNTER — Other Ambulatory Visit: Payer: Self-pay | Admitting: Psychiatry

## 2019-08-31 DIAGNOSIS — F319 Bipolar disorder, unspecified: Secondary | ICD-10-CM | POA: Diagnosis not present

## 2019-08-31 DIAGNOSIS — Z79899 Other long term (current) drug therapy: Secondary | ICD-10-CM | POA: Diagnosis not present

## 2019-09-01 LAB — LITHIUM LEVEL: Lithium Lvl: 0.3 mmol/L — ABNORMAL LOW (ref 0.5–1.2)

## 2019-09-01 LAB — BASIC METABOLIC PANEL
BUN/Creatinine Ratio: 27 — ABNORMAL HIGH (ref 10–24)
BUN: 28 mg/dL — ABNORMAL HIGH (ref 8–27)
CO2: 21 mmol/L (ref 20–29)
Calcium: 9.3 mg/dL (ref 8.6–10.2)
Chloride: 105 mmol/L (ref 96–106)
Creatinine, Ser: 1.02 mg/dL (ref 0.76–1.27)
GFR calc Af Amer: 86 mL/min/{1.73_m2} (ref >59)
GFR calc non Af Amer: 75 mL/min/{1.73_m2} (ref >59)
Glucose: 93 mg/dL (ref 65–99)
Potassium: 4.3 mmol/L (ref 3.5–5.2)
Sodium: 139 mmol/L (ref 134–144)

## 2019-09-01 LAB — TSH: TSH: 1.81 u[IU]/mL (ref 0.450–4.500)

## 2019-09-06 NOTE — Progress Notes (Signed)
Pt. States that he does take 2 capsules daily. He states he does not want to go up on this dose as it is working very well for him. Moods are great and no worsening depression. He got the test done a little longer than 12 hours.

## 2019-10-22 DIAGNOSIS — L570 Actinic keratosis: Secondary | ICD-10-CM | POA: Diagnosis not present

## 2019-10-22 DIAGNOSIS — C44329 Squamous cell carcinoma of skin of other parts of face: Secondary | ICD-10-CM | POA: Diagnosis not present

## 2019-10-22 DIAGNOSIS — Z85828 Personal history of other malignant neoplasm of skin: Secondary | ICD-10-CM | POA: Diagnosis not present

## 2019-11-15 ENCOUNTER — Ambulatory Visit: Payer: PPO | Admitting: Psychiatry

## 2019-11-26 DIAGNOSIS — C44329 Squamous cell carcinoma of skin of other parts of face: Secondary | ICD-10-CM | POA: Diagnosis not present

## 2019-11-26 DIAGNOSIS — Z85828 Personal history of other malignant neoplasm of skin: Secondary | ICD-10-CM | POA: Diagnosis not present

## 2019-12-03 DIAGNOSIS — L570 Actinic keratosis: Secondary | ICD-10-CM | POA: Diagnosis not present

## 2019-12-04 ENCOUNTER — Other Ambulatory Visit: Payer: Self-pay

## 2019-12-04 ENCOUNTER — Other Ambulatory Visit (HOSPITAL_COMMUNITY): Payer: Self-pay | Admitting: Family Medicine

## 2019-12-04 DIAGNOSIS — F900 Attention-deficit hyperactivity disorder, predominantly inattentive type: Secondary | ICD-10-CM

## 2019-12-04 DIAGNOSIS — F319 Bipolar disorder, unspecified: Secondary | ICD-10-CM

## 2019-12-04 MED ORDER — BUPROPION HCL ER (XL) 150 MG PO TB24: 300.0000 mg | ORAL_TABLET | Freq: Every day | ORAL | 0 refills | Status: DC

## 2019-12-25 ENCOUNTER — Other Ambulatory Visit: Payer: Self-pay | Admitting: Psychiatry

## 2019-12-25 DIAGNOSIS — F319 Bipolar disorder, unspecified: Secondary | ICD-10-CM

## 2020-01-10 ENCOUNTER — Other Ambulatory Visit: Payer: Self-pay | Admitting: Psychiatry

## 2020-01-10 ENCOUNTER — Telehealth: Payer: Self-pay | Admitting: Psychiatry

## 2020-01-10 ENCOUNTER — Ambulatory Visit: Payer: PPO | Admitting: Psychiatry

## 2020-01-10 DIAGNOSIS — F900 Attention-deficit hyperactivity disorder, predominantly inattentive type: Secondary | ICD-10-CM

## 2020-01-10 DIAGNOSIS — F319 Bipolar disorder, unspecified: Secondary | ICD-10-CM

## 2020-01-10 MED ORDER — METHYLPHENIDATE HCL 10 MG PO TABS: 10.0000 mg | ORAL_TABLET | Freq: Three times a day (TID) | ORAL | 0 refills | Status: DC

## 2020-01-10 MED ORDER — LITHIUM CARBONATE 300 MG PO CAPS: 300.0000 mg | ORAL_CAPSULE | Freq: Two times a day (BID) | ORAL | 0 refills | Status: DC

## 2020-01-10 NOTE — Telephone Encounter (Signed)
RX sent.  Won't change RX until appt

## 2020-01-10 NOTE — Telephone Encounter (Signed)
Pt is requesting refills on his Lithium and the Ritalin. 1/6 appt RS to 2/22 due to provider out. He wanted to discuss switching to the Ritalin ER insurance is covering now if you approve the switch.

## 2020-02-03 ENCOUNTER — Telehealth: Payer: Self-pay

## 2020-02-03 NOTE — Telephone Encounter (Signed)
Patient approved for lower copayment for Methylphenidate 10 mg tablet, effective 01/05/2020-01/03/2021

## 2020-02-26 ENCOUNTER — Other Ambulatory Visit: Payer: Self-pay | Admitting: Psychiatry

## 2020-02-26 ENCOUNTER — Encounter: Payer: Self-pay | Admitting: Psychiatry

## 2020-02-26 ENCOUNTER — Other Ambulatory Visit: Payer: Self-pay

## 2020-02-26 ENCOUNTER — Ambulatory Visit (INDEPENDENT_AMBULATORY_CARE_PROVIDER_SITE_OTHER): Payer: Medicare Other | Admitting: Psychiatry

## 2020-02-26 VITALS — BP 132/81 | HR 64

## 2020-02-26 DIAGNOSIS — F319 Bipolar disorder, unspecified: Secondary | ICD-10-CM

## 2020-02-26 DIAGNOSIS — F900 Attention-deficit hyperactivity disorder, predominantly inattentive type: Secondary | ICD-10-CM

## 2020-02-26 DIAGNOSIS — F411 Generalized anxiety disorder: Secondary | ICD-10-CM | POA: Diagnosis not present

## 2020-02-26 DIAGNOSIS — G251 Drug-induced tremor: Secondary | ICD-10-CM | POA: Diagnosis not present

## 2020-02-26 DIAGNOSIS — Z79899 Other long term (current) drug therapy: Secondary | ICD-10-CM

## 2020-02-26 DIAGNOSIS — F5105 Insomnia due to other mental disorder: Secondary | ICD-10-CM

## 2020-02-26 MED ORDER — BUPROPION HCL ER (XL) 300 MG PO TB24: 300.0000 mg | ORAL_TABLET | Freq: Every day | ORAL | 0 refills | Status: DC

## 2020-02-26 MED ORDER — METHYLPHENIDATE HCL 10 MG PO TABS: 10.0000 mg | ORAL_TABLET | Freq: Three times a day (TID) | ORAL | 0 refills | Status: DC

## 2020-02-26 MED ORDER — QUETIAPINE FUMARATE 300 MG PO TABS: 300.0000 mg | ORAL_TABLET | Freq: Every day | ORAL | 1 refills | Status: DC

## 2020-02-26 MED ORDER — LITHIUM CARBONATE 300 MG PO CAPS: 300.0000 mg | ORAL_CAPSULE | Freq: Two times a day (BID) | ORAL | 0 refills | Status: DC

## 2020-02-26 NOTE — Progress Notes (Signed)
Brian Guerrero [id number] 06-08-1950 70 y.o.     Subjective:   Patient ID:  Brian Guerrero is a 70 y.o. (DOB [birth year]) male.  Chief Complaint:  Chief Complaint  Patient presents with  . Follow-up  . Bipolar I disorder ([place])    Depression        Associated symptoms include no decreased concentration and no suicidal ideas.  Past medical history includes anxiety.   Anxiety Patient reports no confusion, decreased concentration, nervous/anxious behavior, palpitations or suicidal ideas.      Brian Guerrero presents to the office today for follow-up of med change.  At visit  March 07, 2018.  For problems with attention and focus Concerta was increased from 27 mg a day to 30 mg a day.  In January we reduced the Wellbutrin and added Concerta 27.  More stamina and more alert but still struggle with concentration and memory and tremor.  Can't say if it's new or a lifelong thing.  Tolerating the Concerta OK.  Thinks he could tolerate an increase in the Concerta.  Occ propranolol for tremor.  seen Jun 02, 2018.  No meds were changed. He had received benefit from the increase Concerta given in March.  Last seen 10/05/18.  Has stopped Concerta bc cost prohibitive.  Taking instead Ritalin and can't remember it TID.  Does get benefit but liked Concerta better.  Overall good and likes retirement.  Was too stressed at work before..  Tremors resolved.  Doing hobbies and travel. Brian Guerrero concerned about episode of mania she noticed in May.  Reduced sleep, faster driving, spending and talking faster.  Not more severe.  Lasted a few weeks and resolved after switch to Ritalin.  She doesn't see this now.  He didn't think it lasted more than a few days.  She thinks he didn't recognize it.  It was not severe. No med changes.  05/16/19 appt noted.\ Doing well with mood.  Liked Concerta better and hard to take 3 daily of Ritalin.  Otherwise no med changes.  Cataract surgery helped. Enjoyed retirements. Patient  reports stable mood and denies depressed or irritable moods.  Patient denies any recent difficulty with anxiety. Rare anxiety.   Patient denies difficulty with sleep initiation or maintenance. Denies appetite disturbance but he's had 20# weight loss. He's getting less sugar in retirement and stays active.  Patient reports that energy and motivation have been good.  Patient denies any difficulty with concentration.  Patient denies any suicidal ideation.  Sleep 8 hours.  Not oversleeping. Retired at 70 yo. Feels good about it.  Enjoying it so far.  Working on new schedule.  Tackling some home projects. Motivation is better.  No depression.   Plan no med changes:  02/26/2020 appointment with following noted: It is been a number of months since the last appointment.  Longer than requested by MD. Doing well and liking retirement. Motivation is still not great but Ritalin helps.   Tremor no worse. "As always, wanting to ask about cutting back on lithium and quetiapine bc under much less stress with retirement."  Didn't realize how much he was stressed. Quetiapine very effective for sleep and can't sleep if misses a dose. Admits he decreased quetiapine from 600 to 450 mg HS months ago. Patient reports stable mood and denies depressed or irritable moods.  Patient denies any recent difficulty with anxiety.  Patient denies difficulty with sleep initiation or maintenance. Denies appetite disturbance.  Patient reports that energy and motivation have  been good.  Patient denies any difficulty with concentration.  Patient denies any suicidal ideation.  Past Psychiatric Medication Trials: Concerta 36,  Ritalin 10 TID   Review of Systems:  Review of Systems  Cardiovascular: Negative for palpitations.  Neurological: Positive for tremors. Negative for weakness.  Psychiatric/Behavioral: Positive for depression. Negative for agitation, behavioral problems, confusion, decreased concentration, dysphoric mood,  hallucinations, self-injury, sleep disturbance and suicidal ideas. The patient is not nervous/anxious and is not hyperactive.   tremor variable worse with caffeine  Medications: I have reviewed the patient's current medications.  Current Outpatient Medications  Medication Sig Dispense Refill  . aspirin EC 81 MG tablet Take 81 mg by mouth daily.    . finasteride (PROSCAR) 5 MG tablet Take 5 mg by mouth daily.    . propranolol (INDERAL) 20 MG tablet Take 20 mg by mouth daily.    . Acetylcysteine (NAC 600 PO) Take 1,200 mg by mouth daily. (Patient not taking: Reported on 02/26/2020)    . buPROPion (WELLBUTRIN XL) 300 MG 24 hr tablet Take 1 tablet (300 mg total) by mouth daily. 90 tablet 0  . Carnitine-B5-B6 (L-CARNITINE 500 PO) Take 1,000 mg by mouth daily. (Patient not taking: Reported on 02/26/2020)    . lithium carbonate 300 MG capsule Take 1 capsule (300 mg total) by mouth 2 (two) times daily. 180 capsule 0  . methylphenidate (RITALIN) 10 MG tablet Take 1 tablet (10 mg total) by mouth 3 (three) times daily with meals. 270 tablet 0  . Pyridoxine HCl (VITAMIN B6) 100 MG TABS Take 100 mg by mouth daily. (Patient not taking: Reported on 02/26/2020)    . QUEtiapine (SEROQUEL) 300 MG tablet Take 1 tablet (300 mg total) by mouth at bedtime. 90 tablet 1   No current facility-administered medications for this visit.    Medication Side Effect:  CO sluggish in the morning. Tremor resolved.  Allergies:  Allergies  Allergen Reactions  . Lincomycin Hcl     As a child/had a hive    Past Medical History:  Diagnosis Date  . Attention deficit disorder 10/19/2017   per pt/ not aware of having ADD  . BPH (benign prostatic hyperplasia)   . Depression with anxiety   . Diverticular disease    [facility]/per pt not aware of this  . Varicose veins   . Wears glasses     Family History  Problem Relation Age of Onset  . Depression Mother   . Prostate cancer Father   . Kidney disease Father      Social History   Socioeconomic History  . Marital status: Married    Spouse name: Not on file  . Number of children: Not on file  . Years of education: Not on file  . Highest education level: Not on file  Occupational History  . Not on file  Tobacco Use  . Smoking status: Never Smoker  . Smokeless tobacco: Never Used  Substance and Sexual Activity  . Alcohol use: Yes    Alcohol/week: 2.0 standard drinks    Types: 2 Standard drinks or equivalent per week  . Drug use: Not Currently  . Sexual activity: Not on file  Other Topics Concern  . Not on file  Social History Narrative  . Not on file   Social Determinants of Health   Financial Resource Strain: Not on file  Food Insecurity: Not on file  Transportation Needs: Not on file  Physical Activity: Not on file  Stress: Not on file  Social Connections: Not on file  Intimate Partner Violence: Not on file    Past Medical History, Surgical history, Social history, and Family history were reviewed and updated as appropriate.   Please see review of systems for further details on the patient's review from today.   Objective:   Physical Exam:  BP 132/81   Pulse 64   Physical Exam Neurological:     Mental Status: He is alert and oriented to person, place, and time.     Cranial Nerves: No dysarthria.  Psychiatric:        Attention and Perception: Attention normal.        Mood and Affect: Mood is not anxious or depressed.        Speech: Speech normal.        Behavior: Behavior is not slowed. Behavior is cooperative.        Thought Content: Thought content normal. Thought content is not paranoid or delusional. Thought content does not include homicidal or suicidal ideation. Thought content does not include homicidal or suicidal plan.        Cognition and Memory: Cognition and memory normal.        Judgment: Judgment normal.     Comments: Insight fair.  No mania     Lab Review:     Component Value Date/Time   NA 139  08/31/2019 1631   K 4.3 08/31/2019 1631   CL 105 08/31/2019 1631   CO2 21 08/31/2019 1631   GLUCOSE 93 08/31/2019 1631   GLUCOSE 93 04/26/2013 0930   BUN 28 (H) 08/31/2019 1631   CREATININE 1.02 08/31/2019 1631   CALCIUM 9.3 08/31/2019 1631   GFRNONAA 75 08/31/2019 1631   GFRAA 86 08/31/2019 1631       Component Value Date/Time   WBC 6.4 04/26/2013 0930   RBC 4.69 04/26/2013 0930   HGB 14.6 04/26/2013 0930   HCT 42.3 04/26/2013 0930   PLT 266 04/26/2013 0930   MCV 90.2 04/26/2013 0930   MCH 31.1 04/26/2013 0930   MCHC 34.5 04/26/2013 0930   RDW 13.4 04/26/2013 0930   LYMPHSABS 1.2 04/26/2013 0930   MONOABS 0.7 04/26/2013 0930   EOSABS 0.3 04/26/2013 0930   BASOSABS 0.0 04/26/2013 0930    Lithium Lvl  Date Value Ref Range Status  08/31/2019 0.3 (L) 0.5 - 1.2 mmol/L Final    Comment:    Plasma concentration of 0.5 - 0.8 mmol/L are advised for long-term use; concentrations of up to 1.2 mmol/L may be necessary during acute treatment.                                  Detection Limit = 0.1                           <0.1 indicates None Detected    08/31/19 level at 430PM on dose 600 HS  No results found for: PHENYTOIN, PHENOBARB, VALPROATE, CBMZ   .res Assessment: Plan:    Bipolar I disorder ([place]) - Plan: buPROPion (WELLBUTRIN XL) 300 MG 24 hr tablet, lithium carbonate 300 MG capsule, QUEtiapine (SEROQUEL) 300 MG tablet  Generalized anxiety disorder  Attention deficit hyperactivity disorder (ADHD), predominantly inattentive type - Plan: buPROPion (WELLBUTRIN XL) 300 MG 24 hr tablet, methylphenidate (RITALIN) 10 MG tablet  Lithium-induced tremor  Insomnia due to mental condition  Lithium use   I believe that his  problems with concentration are related to a type of acquired ADD due to the consequence of chronic bipolar depression with inadequate response to typical bipolar disorder medications as well as Wellbutrin.  Since this concentration problem was serious enough  to be affecting his job function and potentially forcing retirement I offered him the option of treating it with stimulants.  He agreed and has obtained substantial benefit from the Concerta especially after increasing from 27 mg up to 36 mg daily.  He has retired now but still needs the stimulant to help with concentration and focus, motivation and productivity at home. Disc importance of staying active to manage risk of depression.     Concerta helped and he liked it but he had to switch to Ritalin 10 mg 3 times daily because of cost.  His wife felt like he was a little manic on Concerta and that has resolved with the switch to Ritalin.  Discussed the risk that stimulants can cause mania.  He did not really really recognize the mania but his wife did and he did respond to his wife's concerns.  Discussed potential benefits, risks, and side effects of stimulants with patient to include increased heart rate, palpitations, insomnia, increased anxiety, increased irritability, or decreased appetite.  Instructed patient to contact office if experiencing any significant tolerability issues.    Continue Wellbutrin to 300 mg daily and  Ritalin 10 TID mg daily.  If there is any jitteriness reduce the Wellbutrin again 150 mg daily.  If there is still side effects call.  Tremor related to medications have resolved since he retired for reasons that are not clear.  Continue lithium 600 mg daily and  OK to try reducing quetiapine to 300 mg nightly for bipolar depression. Reduction does have some risk of mania or depression.  Discussed potential metabolic side effects associated with atypical antipsychotics, as well as potential risk for movement side effects. Advised pt to contact office if movement side effects occur.   Counseled patient regarding potential benefits, risks, and side effects of lithium to include potential risk of lithium affecting thyroid and renal function.  Discussed need for periodic lab  monitoring to determine drug level and to assess for potential adverse effects.  Counseled patient regarding signs and symptoms of lithium toxicity and advised that they notify office immediately or seek urgent medical attention if experiencing these signs and symptoms.  Patient advised to contact office with any questions or concerns.  Recommend consistent use of propranolol before work to help with the tremor.  He has not done that but it is his choice.  Lithium level is in the low normal range because that is the most he can tolerate.  His vitamin D level was 36 and consideration could be given to increasing vitamin D.  FU 3 - 4 mos  Brian Parents, MD, DFAPA   No future appointments.  No orders of the defined types were placed in this encounter.     -------------------------------

## 2020-03-26 ENCOUNTER — Other Ambulatory Visit (HOSPITAL_COMMUNITY): Payer: Self-pay | Admitting: Urology

## 2020-03-28 ENCOUNTER — Other Ambulatory Visit (HOSPITAL_BASED_OUTPATIENT_CLINIC_OR_DEPARTMENT_OTHER): Payer: Self-pay

## 2020-04-07 ENCOUNTER — Other Ambulatory Visit (HOSPITAL_COMMUNITY): Payer: Self-pay

## 2020-04-07 MED FILL — Propranolol HCl Tab 20 MG: ORAL | 90 days supply | Qty: 90 | Fill #0 | Status: AC

## 2020-04-07 MED FILL — Lithium Carbonate Cap 300 MG: ORAL | 90 days supply | Qty: 180 | Fill #0 | Status: AC

## 2020-04-10 ENCOUNTER — Ambulatory Visit: Payer: Self-pay | Admitting: Podiatry

## 2020-04-10 ENCOUNTER — Other Ambulatory Visit (HOSPITAL_COMMUNITY): Payer: Self-pay

## 2020-04-10 NOTE — Progress Notes (Signed)
Assessment/Plan:   1.  Tremor  -This is likely due to the lithium.  Studies are varied, but incidate that up to 2/3 of patients on lithium will have lithium-induced tremor, even if the patients are not toxic on the medication.  This is just a common side effect of patients on lithium.  I did not recommend that he stop or taper the lithium, but rather talk to his prescribing physician about this, which he has been doing.  I did tell him that in my experience, lithium-induced tremor does not respond well to attempts at treating it with other medications.  Fortunately for him, however, he really has found propranolol to be effective for it and would like to continue to use that.  Patient currently on 600 mg of lithium daily.  -Patient is on other medications contributing to tremor, including Ritalin (previously on Concerta) and quetiapine.  -Patient on propranolol because of tremor.  He has found this to be effective.  His blood pressure was already fairly low today and I did not really recommend increasing it.  Patient states that it was unusually low today.  If it is not generally a slow, the propranolol certainly has room to be increased.  2.  Memory Change  -suspect due to meds but we decided to go ahead and do neurocognitive testing.  3.  His neurological examination was nonfocal and nonlateralizing today.  Do not see any indication right now to do any neuroimaging.  That could certainly change if there was anything more than MCI on neurocognitive testing.  Patient agreeable to this approach.    Subjective:   Brian Guerrero was seen in consultation in the movement disorder clinic at the request of Antony Contras, MD.  The evaluation is for tremor.  Tremor started approximately many years ago.  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  It involves the bilateral UE.  Tremor is most noticeable when writing.  He is R hand dominant.  Family  has started noticing tremor.   There is a family hx of tremor in his father.    Affected by caffeine:  Yes.   (2-3 cups, 12-oz cups of coffee per day) Affected by alcohol:  Yes.   (3-4 beers/week) Affected by stress:  Yes.   Affected by fatigue:  No. Spills soup if on spoon:  No. (has trouble with chips and salsa) Spills glass of liquid if full:  No. (not most of the time) Affects ADL's (tying shoes, brushing teeth, etc):  No.  Current/Previously tried tremor medications: Propranolol, 20 mg daily, prescribed for tremor and helps "quite a bit" - hes been on it for a few years, initially prn and now daily.  Current medications that may exacerbate tremor:  Lithium, quetiapine (just decreased by psychiatry in February to 300 mg); Ritalin  Pt also c/o memory change.  Pt has trouble with recall with content of a TV show he has watched.  He has some word finding trouble.   Living situation:  Pt lives with their spouse.  The patient does do the finances in the home.  He has no trouble managing this.   The patient does drive.   There have no been any motor vehicle accidents in the recent years.  The patient does cook.  There is no difficulty remembering common recipes.  The stovetop has not been left on accidentally.  ADL's:  The patient is able to perform  his own ADL's. The patient self medicates.  Also c/o clumsiness.  Only fall was hiking 2 years ago while hiking - "I just mis-stepped but I really went down."  He does think that it was a fall anyone could have taken.  No paresthesias.  No lateralizing weakness.  No diplopia.    No hx of neuroimaging.    Allergies  Allergen Reactions  . Lincomycin Hcl     As a child/had a hive    Current Outpatient Medications  Medication Instructions  . Acetylcysteine (NAC 600 PO) 1,200 mg, Oral, Daily  . aspirin EC 81 mg, Daily  . buPROPion (WELLBUTRIN XL) 300 MG 24 hr tablet TAKE 1 TABLET BY MOUTH ONCE A DAY  . Carnitine-B5-B6 (L-CARNITINE 500 PO) 1,000  mg, Oral, Daily  . finasteride (PROSCAR) 5 MG tablet TAKE 1 TABLET BY MOUTH ONCE DAILY  . lithium carbonate 300 MG capsule TAKE 1 CAPSULE BY MOUTH 2 TIMES DAILY  . methylphenidate (RITALIN) 10 MG tablet TAKE 1 TABLET BY MOUTH 3 TIMES DAILY WITH A MEAL  . propranolol (INDERAL) 20 mg, Oral, Daily  . QUEtiapine (SEROQUEL) 300 MG tablet TAKE 1 TABLET BY MOUTH AT BEDTIME  . sildenafil (VIAGRA) 100 MG tablet TAKE 1 TABLET BY MOUTH AS NEEDED AS DIRECTED  . Vitamin B6 100 mg, Oral, Daily     Objective:   VITALS:   Vitals:   04/14/20 0925  BP: 110/60  Pulse: 60  SpO2: 98%  Weight: 175 lb (79.4 kg)  Height: 5\' 9"  (1.753 m)   Gen:  Appears stated age and in NAD. HEENT:  Normocephalic, atraumatic. The mucous membranes are moist. The superficial temporal arteries are without ropiness or tenderness. Cardiovascular: Regular rate and rhythm. Lungs: Clear to auscultation bilaterally. Neck: There are no carotid bruits noted bilaterally.  NEUROLOGICAL:  Orientation:  The patient is alert and oriented x 3.   Cranial nerves: There is good facial symmetry. Extraocular muscles are intact and visual fields are full to confrontational testing. Speech is fluent and clear. Soft palate rises symmetrically and there is no tongue deviation. Hearing is intact to conversational tone. Tone: Tone is good throughout. Sensation: Sensation is intact to light touch touch throughout (facial, trunk, extremities). Vibration is intact at the bilateral big toe. There is no extinction with double simultaneous stimulation. There is no sensory dermatomal level identified. Coordination:  The patient has no dysdiadichokinesia or dysmetria. Motor: Strength is 5/5 in the bilateral upper and lower extremities.  Shoulder shrug is equal bilaterally.  There is no pronator drift.  There are no fasciculations noted. DTR's: Deep tendon reflexes are 2/4 at the bilateral biceps, triceps, brachioradialis, patella and achilles.  Plantar  responses are downgoing bilaterally. Gait and Station: The patient is able to ambulate without difficulty. The patient is able to heel toe walk without any difficulty. The patient is able to ambulate in a tandem fashion. The patient is able to stand in the Romberg position.   MOVEMENT EXAM: Tremor:  There is no rest tremor.  There is mild postural tremor.  There is mild to moderate intention tremor.  I have reviewed and interpreted the following labs independently   Chemistry      Component Value Date/Time   NA 139 08/31/2019 1631   K 4.3 08/31/2019 1631   CL 105 08/31/2019 1631   CO2 21 08/31/2019 1631   BUN 28 (H) 08/31/2019 1631   CREATININE 1.02 08/31/2019 1631      Component Value Date/Time  CALCIUM 9.3 08/31/2019 1631      Lab Results  Component Value Date   WBC 6.4 04/26/2013   HGB 14.6 04/26/2013   HCT 42.3 04/26/2013   MCV 90.2 04/26/2013   PLT 266 04/26/2013   Lab Results  Component Value Date   TSH 1.810 08/31/2019      Total time spent on today's visit was 45 minutes, including both face-to-face time and nonface-to-face time.  Time included that spent on review of records (prior notes available to me/labs/imaging if pertinent), discussing treatment and goals, answering patient's questions and coordinating care.  CC:  Maury Dus, MD

## 2020-04-14 ENCOUNTER — Encounter: Payer: Self-pay | Admitting: Neurology

## 2020-04-14 ENCOUNTER — Ambulatory Visit: Payer: Medicare Other | Admitting: Neurology

## 2020-04-14 ENCOUNTER — Other Ambulatory Visit: Payer: Self-pay

## 2020-04-14 VITALS — BP 110/60 | HR 60 | Ht 69.0 in | Wt 175.0 lb

## 2020-04-14 DIAGNOSIS — G251 Drug-induced tremor: Secondary | ICD-10-CM

## 2020-04-14 DIAGNOSIS — R413 Other amnesia: Secondary | ICD-10-CM | POA: Diagnosis not present

## 2020-04-14 NOTE — Patient Instructions (Signed)
You have been referred for a neurocognitive evaluation (i.e., evaluation of memory and thinking abilities). Please bring someone with you to this appointment if possible, as it is helpful for the neuropsychologist to hear from both you and another adult who knows you well. Please bring eyeglasses and hearing aids if you wear them.    The evaluation will take approximately 3 hours and has two parts:   . The first part is a clinical interview with the neuropsychologist, Dr. Melvyn Novas or Dr. Nicole Kindred. During the interview, the neuropsychologist will speak with you and the individual you brought to the appointment.    . The second part of the evaluation is testing with the doctor's technician, aka psychometrician, Hinton Dyer or [employer]. During the testing, the technician will ask you to remember different types of material, solve problems, and answer some questionnaires. Your family member will not be present for this portion of the evaluation.   Please note: We have to reserve several hours of the neuropsychologist's time and the psychometrician's time for your evaluation appointment. As such, there is a No-Show fee of $100. If you are unable to attend any of your appointments, please contact our office as soon as possible to reschedule.

## 2020-05-22 ENCOUNTER — Other Ambulatory Visit (HOSPITAL_COMMUNITY): Payer: Self-pay

## 2020-05-22 ENCOUNTER — Other Ambulatory Visit: Payer: Self-pay | Admitting: Psychiatry

## 2020-05-22 DIAGNOSIS — F319 Bipolar disorder, unspecified: Secondary | ICD-10-CM

## 2020-05-22 DIAGNOSIS — F900 Attention-deficit hyperactivity disorder, predominantly inattentive type: Secondary | ICD-10-CM

## 2020-05-22 MED ORDER — BUPROPION HCL ER (XL) 300 MG PO TB24
ORAL_TABLET | Freq: Every day | ORAL | 0 refills | Status: DC
  Filled 2020-05-22: qty 90, 90d supply, fill #0

## 2020-05-22 MED FILL — Finasteride Tab 5 MG: ORAL | 90 days supply | Qty: 90 | Fill #0 | Status: AC

## 2020-05-22 NOTE — Telephone Encounter (Signed)
Brian Guerrero called in today stating that he is going out of town tomorrow and needs his prescription for Wellbutrin refilled. Appt 8/23. Ph: [phone]. Pharmacy [facility] [place]

## 2020-07-10 ENCOUNTER — Other Ambulatory Visit: Payer: Self-pay | Admitting: Psychiatry

## 2020-07-10 ENCOUNTER — Other Ambulatory Visit (HOSPITAL_COMMUNITY): Payer: Self-pay

## 2020-07-10 DIAGNOSIS — F319 Bipolar disorder, unspecified: Secondary | ICD-10-CM

## 2020-07-10 DIAGNOSIS — F900 Attention-deficit hyperactivity disorder, predominantly inattentive type: Secondary | ICD-10-CM

## 2020-07-10 MED ORDER — LITHIUM CARBONATE 300 MG PO CAPS
ORAL_CAPSULE | Freq: Two times a day (BID) | ORAL | 0 refills | Status: DC
  Filled 2020-07-10: qty 180, 90d supply, fill #0

## 2020-07-10 MED ORDER — BUPROPION HCL ER (XL) 300 MG PO TB24
ORAL_TABLET | Freq: Every day | ORAL | 0 refills | Status: DC
  Filled 2020-07-10: qty 90, 90d supply, fill #0

## 2020-07-11 ENCOUNTER — Other Ambulatory Visit (HOSPITAL_COMMUNITY): Payer: Self-pay

## 2020-07-16 ENCOUNTER — Other Ambulatory Visit (HOSPITAL_COMMUNITY): Payer: Self-pay

## 2020-07-16 ENCOUNTER — Other Ambulatory Visit (HOSPITAL_BASED_OUTPATIENT_CLINIC_OR_DEPARTMENT_OTHER): Payer: Self-pay

## 2020-07-16 MED ORDER — PROPRANOLOL HCL 20 MG PO TABS
ORAL_TABLET | ORAL | 3 refills | Status: DC
  Filled 2020-07-16: qty 90, 90d supply, fill #0

## 2020-07-16 MED FILL — Quetiapine Fumarate Tab 300 MG: ORAL | 90 days supply | Qty: 90 | Fill #0 | Status: AC

## 2020-07-18 ENCOUNTER — Ambulatory Visit (INDEPENDENT_AMBULATORY_CARE_PROVIDER_SITE_OTHER): Payer: Medicare (Managed Care) | Admitting: Psychology

## 2020-07-18 ENCOUNTER — Other Ambulatory Visit: Payer: Self-pay

## 2020-07-18 ENCOUNTER — Encounter: Payer: Self-pay | Admitting: Psychology

## 2020-07-18 ENCOUNTER — Ambulatory Visit: Payer: Medicare (Managed Care) | Admitting: Psychology

## 2020-07-18 DIAGNOSIS — R41840 Attention and concentration deficit: Secondary | ICD-10-CM | POA: Diagnosis not present

## 2020-07-18 DIAGNOSIS — K5790 Diverticulosis of intestine, part unspecified, without perforation or abscess without bleeding: Secondary | ICD-10-CM | POA: Insufficient documentation

## 2020-07-18 DIAGNOSIS — G251 Drug-induced tremor: Secondary | ICD-10-CM

## 2020-07-18 DIAGNOSIS — F319 Bipolar disorder, unspecified: Secondary | ICD-10-CM | POA: Insufficient documentation

## 2020-07-18 DIAGNOSIS — F3181 Bipolar II disorder: Secondary | ICD-10-CM | POA: Insufficient documentation

## 2020-07-18 DIAGNOSIS — F3170 Bipolar disorder, currently in remission, most recent episode unspecified: Secondary | ICD-10-CM

## 2020-07-18 DIAGNOSIS — R4189 Other symptoms and signs involving cognitive functions and awareness: Secondary | ICD-10-CM

## 2020-07-18 DIAGNOSIS — N400 Enlarged prostate without lower urinary tract symptoms: Secondary | ICD-10-CM | POA: Insufficient documentation

## 2020-07-18 NOTE — Progress Notes (Signed)
   Psychometrician Note   Cognitive testing was administered to Brian Guerrero by Milana Kidney, B.S. (psychometrist) under the supervision of Dr. Christia Reading, Ph.D., licensed psychologist on 07/18/20. [name] did not appear overtly distressed by the testing session per behavioral observation or responses across self-report questionnaires. Rest breaks were offered.    The battery of tests administered was selected by Dr. Christia Reading, Ph.D. with consideration to [name]'s current level of functioning, the nature of his symptoms, emotional and behavioral responses during interview, level of literacy, observed level of motivation/effort, and the nature of the referral question. This battery was communicated to the psychometrist. Communication between Dr. Christia Reading, Ph.D. and the psychometrist was ongoing throughout the evaluation and Dr. Christia Reading, Ph.D. was immediately accessible at all times. Dr. Christia Reading, Ph.D. provided supervision to the psychometrist on the date of this service to the extent necessary to assure the quality of all services provided.    [name] will return within approximately 1-2 weeks for an interactive feedback session with Dr. Melvyn Novas at which time his test performances, clinical impressions, and treatment recommendations will be reviewed in detail. [name] understands he can contact our office should he require our assistance before this time.  A total of 135 minutes of billable time were spent face-to-face with Mr. Limb by the psychometrist. This includes both test administration and scoring time. Billing for these services is reflected in the clinical report generated by Dr. Christia Reading, Ph.D.  This note reflects time spent with the psychometrician and does not include test scores or any clinical interpretations made by Dr. Melvyn Novas. The full report will follow in a separate note.

## 2020-07-18 NOTE — Progress Notes (Signed)
NEUROPSYCHOLOGICAL EVALUATION [facility] Department of Neurology  Date of Evaluation: July 18, 2020  Reason for Referral:   Brian Guerrero is a 70 y.o. right-handed Caucasian male referred by Brian Guerrero, D.O., to characterize his current cognitive functioning and assist with diagnostic clarity and treatment planning in the context of subjective cognitive decline and concerns surrounding medication-induced tremor.   Assessment and Plan:   Clinical Impression(s): [name]'s pattern of performance is suggestive of performance variability and a relative weakness across processing speed, as well as learning and memory (particularly retrieval and recognition/consolidation aspects of memory). Performance across all other domains were appropriate. This included attention/concentration, executive functioning, safety/judgment, receptive and expressive language, and visuospatial abilities. [name] largely denied difficulties completing instrumental activities of daily living (ADLs) independently. Given that there is some performance across testing that is below expectation, he may best be characterized as having mild cognitive impairment. However, I do no believe that the underlying etiology of these deficits is purely neurological in nature, thus making a formal diagnosis of a mild neurocognitive disorder inappropriate at the present time.   It is worth highlighting that trouble with forgetfulness and distractibility can be seen in individuals currently taking lithium. Outside of instances where documented lithium toxicity has occurred, these cognitive changes are generally mild. Overall, medication side effects, coupled with his history of bipolar disorder and report of acutely mild psychiatric distress, represents the most likely cause for cognitive dysfunction at the present time. I do not believe that [name] has diagnosable ADHD. However, I do agree with  Dr. Casimiro Guerrero conceptualization that trouble with processing speed and concentration created by variably managed bipolar disorder and anxiety over the years can mimic or feel like this condition and that continued psychostimulant intervention is quite reasonable assuming that [name] continues to report benefits.  Regarding potential neurological causes, his pattern across testing is nonspecific in nature. Dr. Doristine Guerrero most recent neurological exam did not reveal strong concerns for Parkinson's disease based upon her notes and there is nothing across neurocognitive testing which would contradict that opinion at the present time. Appropriate performances across executive functioning and visuospatial abilities, coupled with lack of common behavioral features, make Lewy body dementia or other degenerative illnesses on the parkinsonism spectrum unlikely. Despite variability across memory measures, [name] did not demonstrate a consistent impairment across retrieval/recognition aspects of memory warranting current concerns surrounding Alzheimer's disease. However, given memory weaknesses, continued monitoring would be prudent.   Recommendations: A repeat neuropsychological evaluation in 18-24 months (or sooner if functional decline is noted) is recommended to assess the trajectory of future cognitive decline should it occur. This will also aid in future efforts towards improved diagnostic clarity.  [name] reported that current psychiatric medications appear effective in treating experienced symptoms. He could consider engaging in short-term psychotherapy to address symptoms of psychiatric distress as needed. He would benefit from an active and collaborative therapeutic environment, rather than one purely supportive in nature. Recommended treatment modalities include Cognitive Behavioral Therapy (CBT) or Acceptance and Commitment Therapy (ACT).  [name] is encouraged to attend to lifestyle factors  for brain health (e.g., regular physical exercise, good nutrition habits, regular participation in cognitively-stimulating activities, and general stress management techniques), which are likely to have benefits for both emotional adjustment and cognition. In fact, in addition to promoting good general health, regular exercise incorporating aerobic activities (e.g., brisk walking, jogging, cycling, etc.) has been demonstrated to be a very effective treatment for depression  and stress, with similar efficacy rates to both antidepressant medication and psychotherapy. Optimal control of vascular risk factors (including safe cardiovascular exercise and adherence to dietary recommendations) is encouraged.   When learning new information, he would benefit from information being broken up into small, manageable pieces. He may also find it helpful to articulate the material in his own words and in a context to promote encoding at the onset of a new task. This material may need to be repeated multiple times to promote encoding.  Memory can be improved using internal strategies such as rehearsal, repetition, chunking, mnemonics, association, and imagery. External strategies such as written notes in a consistently used memory journal, visual and nonverbal auditory cues such as a calendar on the refrigerator or appointments with alarm, such as on a cell phone, can also help maximize recall.    To address problems with processing speed, he may wish to consider:   -Ensuring that he is alerted when essential material or instructions are being presented   -Adjusting the speed at which new information is presented   -Allowing for more time in comprehending, processing, and responding in conversation  To address problems with fluctuating attention, he may wish to consider:   -Avoiding external distractions when needing to concentrate   -Limiting exposure to fast paced environments with multiple sensory demands   -Writing  down complicated information and using checklists   -Attempting and completing one task at a time (i.e., no multi-tasking)   -Verbalizing aloud each step of a task to maintain focus   -Taking frequent breaks during the completion of steps/tasks to avoid fatigue   -Reducing the amount of information considered at one time  Review of Records:   Mr. Cieslinski was seen by his psychiatrist Lynder Parents, M.D.) on 02/26/2020 for follow-up of bipolar disorder and generalized anxiety symptoms. There is mention of prior prescription of Concerta to treat attentional difficulties; however, this was stopped towards the end of 2020 due to it being cost prohibitive. He was placed on Ritalin which does not seem to have the same effect. Dr. Clovis Pu expressed his belief that problems with concentration are related to a type of acquired ADHD due to the consequence of chronic bipolar disorder with inadequate response to typical medication intervention. During this appointment, Mr. Caponi reported doing well. He denied recent difficulty with anxiety or sleep. Tremors were not said to be any worse.   Mr. Corp was seen by [facility] Guiles Tat, D.O.) on 04/14/2020 for an evaluation of tremor. Symptoms were said to have started several years prior and involve the upper extremities bilaterally. 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 Given his history of bipolar disorder and regular use of lithium, Dr. Carles Collet felt that tremulous symptoms were most likely medication side effects. He has been taking propranolol which has been very helpful at improving these symptoms. While meeting with Dr. Carles Collet, Mr. Brechtel also reported subjective memory complaints. Specifically, he identified word finding and trouble recalling the content of a show he had recently watched. ADLs were described as intact. Ultimately, Mr. Sorter was referred for a comprehensive neuropsychological evaluation to  characterize his cognitive abilities and to assist with diagnostic clarity and treatment planning.   No neuroimaging was available for my review.   Past Medical History:  Diagnosis Date   Attention or concentration deficit    Bipolar disorder    BPH (benign prostatic hyperplasia)    Diverticular disease  [facility]/per pt not aware of this   Lithium-induced tremor    Varicose veins    Wears glasses     Past Surgical History:  Procedure Laterality Date   CATARACT EXTRACTION, BILATERAL     COLONOSCOPY     INGUINAL HERNIA REPAIR Right 04/30/2013   Procedure: HERNIA REPAIR INGUINAL ADULT;  Surgeon: Rolm Bookbinder, MD;  Location: [facility];  Service: General;  Laterality: Right;   WISDOM TOOTH EXTRACTION      Current Outpatient Medications:    Acetylcysteine (NAC 600 PO), Take 1,200 mg by mouth daily., Disp: , Rfl:    aspirin EC 81 MG tablet, Take 81 mg by mouth daily., Disp: , Rfl:    buPROPion (WELLBUTRIN XL) 300 MG 24 hr tablet, Take by mouth daily., Disp: 90 tablet, Rfl: 0   Carnitine-B5-B6 (L-CARNITINE 500 PO), Take 1,000 mg by mouth daily., Disp: , Rfl:    finasteride (PROSCAR) 5 MG tablet, TAKE 1 TABLET BY MOUTH ONCE DAILY, Disp: 90 tablet, Rfl: 3   lithium carbonate 300 MG capsule, Take1 capsule by mouth 2 (two) times daily., Disp: 180 capsule, Rfl: 0   methylphenidate (RITALIN) 10 MG tablet, TAKE 1 TABLET BY MOUTH 3 TIMES DAILY WITH A MEAL, Disp: 270 tablet, Rfl: 0   propranolol (INDERAL) 20 MG tablet, Take 20 mg by mouth daily., Disp: , Rfl:    propranolol (INDERAL) 20 MG tablet, Take 1 tablet by mouth in the morning, Disp: 90 tablet, Rfl: 3   Pyridoxine HCl (VITAMIN B6) 100 MG TABS, Take 100 mg by mouth daily., Disp: , Rfl:    QUEtiapine (SEROQUEL) 300 MG tablet, TAKE 1 TABLET BY MOUTH AT BEDTIME, Disp: 90 tablet, Rfl: 1   sildenafil (VIAGRA) 100 MG tablet, TAKE 1 TABLET BY MOUTH AS NEEDED AS DIRECTED, Disp: 30 tablet, Rfl: 11  Clinical Interview:    The following information was obtained during a clinical interview with Mr. Warga and his wife prior to cognitive testing.  Cognitive Symptoms: Decreased short-term memory: Endorsed. Primarily examples included not recalling details of past events, activities, or conversations. He reported some trouble recalling names, but minimized these difficulties relative to others. Memory concerns were said to be present for the past 10 years and have gradually worsened over time.  Decreased long-term memory: Denied. Decreased attention/concentration: Endorsed. He reported longstanding trouble with sustained attention, distractibility, and losing his train of thought. Difficulties were said to extend back many years. His wife reported that they always wondered if he has undiagnosed ADHD. Per his psychiatrist, there was expressed concern that psychiatric illness created a situation with symptoms mimicking inattention. This is what led to stimulant medication prescription.  Reduced processing speed: Denied. However, his wife did state her perception that processing speed has somewhat diminished over the years.  Difficulties with executive functions: Endorsed. He reported longstanding difficulties with organization and indecision. He generally denied trouble acting impulsively. His wife denied any egregious recent personality changes.  Difficulties with emotion regulation: Denied. Difficulties with receptive language: Denied. Difficulties with word finding: Endorsed "occasionally."  Decreased visuoperceptual ability: Denied.  Difficulties completing ADLs: Denied.  Additional Medical History: History of traumatic brain injury/concussion: Unclear. Approximately 2-3 years prior, he reported falling while hiking. Per Dr. Doristine Guerrero notes, he reported stating "I just mis-stepped but I really went down." At that time, he agreed that it was a fall anyone could have taken. He described this incident when asked about  prior head injuries but did not specifically report hitting his head  or being knocked unconscious. No other potential head injuries were reported.  History of stroke: Denied. History of seizure activity: Denied. History of known exposure to toxins: Denied. Symptoms of chronic pain: Denied. Experience of frequent headaches/migraines: Denied. Frequent instances of dizziness/vertigo: Denied.  Sensory changes: He had cataract surgery previously and noted no visual acuity concerns at the present time. Other sensory changes/difficulties (e.g., hearing, taste, or smell) were denied.  Balance/coordination difficulties: Endorsed. He reported a somewhat longstanding history of being clumsy. He did report feeling as though his balance had declined over the past few years. He provided an example of finding it much harder to get on a bicycle. He also reiterated his prior fall as described above. He denied any lateralized weakness, recent falls, or the presence of any paraesthesias. His wife noted that he seems more forward leaning while ambulating.  Other motor difficulties: Endorsed. Historically, he reported significant tremulous symptoms in his upper extremities bilaterally. However, since starting propranolol, these symptoms have notably diminished to the extent where he stated that they generally are not noticeable outside of when he is writing/drawing.  Sleep History: Estimated hours obtained each night: 6-8 hours. He described his sleep as "good" overall.  Difficulties falling asleep: Denied. Medications are helpful in this regard.  Difficulties staying asleep: Denied. Feels rested and refreshed upon awakening: Endorsed.  History of snoring: Endorsed. His wife reported mild snoring behaviors, largely position dependant.  History of waking up gasping for air: Denied. Witnessed breath cessation while asleep: Denied.  History of vivid dreaming: Denied. Excessive movement while asleep:  Denied. Instances of acting out his dreams: Denied.  Psychiatric/Behavioral Health History: Depression: He acknowledged a history of depressive episodes associated with his prior diagnosis of bipolar disorder. His last episode was said to be around three years in the past. He described his current mood as "pretty level," stating that medications have been effective lately. Current or remote suicidal ideation, intent, or plan was denied.  Anxiety: Denied. Mania: Endorsed (see above).  Trauma History: Denied. Visual/auditory hallucinations: Denied. Delusional thoughts: Denied.  Tobacco: Denied. Alcohol: He reported consuming generally one beer each night. He denied a history of problematic alcohol abuse or dependence.  Recreational drugs: Denied.  Family History: Problem Relation Age of Onset   Depression Mother    Prostate cancer Father    Kidney disease Father    Dementia Father        unspecified type; symptom onset in late 80s/early 65s   Tremor Father    Parkinson's disease Sister    This information was confirmed by Mr. Arlis Porta.  Academic/Vocational History: Highest level of educational attainment: 17 years. 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 No relative weaknesses were identified.  History of developmental delay: Denied. History of grade repetition: Denied. Enrollment in special education courses: Denied. History of LD/ADHD: Denied.  Employment: Retired. He previously worked as an Therapist, sports.   Evaluation Results:   Behavioral Observations: Mr. Stoklosa was accompanied by his wife, arrived to his appointment on time, and was appropriately dressed and groomed. He appeared alert and oriented. Observed gait and station were within normal limits. Gross motor functioning appeared intact upon informal observation and no abnormal movements (e.g.,  tremors) were noted. However, he did appear to fidget or change his position frequently throughout the interview. His affect was generally relaxed and positive, but did range appropriately given the subject  being discussed during the clinical interview or the task at hand during testing procedures. Spontaneous speech was fluent and word finding difficulties were not observed during the clinical interview. Thought processes were coherent, organized, and normal in content. Insight into his cognitive difficulties appeared adequate.   During testing, mild impulsivity was noted in that he was very quick to start tasks, sometimes at the expense of his overall performance. Sustained attention was appropriate. Task engagement was adequate and he persisted when challenged. Overall, Mr. Quach was cooperative with the clinical interview and subsequent testing procedures.   Adequacy of Effort: The validity of neuropsychological testing is limited by the extent to which the individual being tested may be assumed to have exerted adequate effort during testing. Mr. Colee expressed his intention to perform to the best of his abilities and exhibited adequate task engagement and persistence. Scores across stand-alone and embedded performance validity measures were within expectation. As such, the results of the current evaluation are believed to be a valid representation of Mr. Gassett current cognitive functioning.  Test Results: Mr. Mcfarlane was largely oriented at the time of the current evaluation. Points were lost for him incorrectly stating his age ("21").   Intellectual abilities based upon educational and vocational attainment were estimated to be in the average to above average range. Premorbid abilities were estimated to be within the well above average range based upon a single-word reading test.   Processing speed was variable, ranging from the below average to well above average normative ranges. Basic  attention was above average. More complex attention (e.g., working memory) was average on a digit repetition task. Scores across on a continuous performance task did not suggest deficits consistent with an attentional disorder such as ADHD. Executive functioning was average to well above average.  While not directly assessed, receptive language abilities were believed to be intact. Likewise, Mr. Tafoya did not exhibit any difficulties comprehending task instructions and answered all questions asked of him appropriately. Assessed expressive language (e.g., verbal fluency and confrontation naming) was above average to exceptionally high.     Assessed visuospatial/visuoconstructional abilities were above average.    Learning (i.e., encoding) of novel verbal and visual information was below average to average. Spontaneous delayed recall (i.e., retrieval) of previously learned information was variable, ranging from the exceptionally low to average normative ranges. Retention rates were 77% across a story learning task, 0% across a list learning task, and 57% across a shape learning task. Performance across recognition tasks was also variable, ranging from the well below average to average normative ranges, suggesting some evidence for information consolidation.   Results of emotional screening instruments suggested that recent symptoms of generalized anxiety were in the mild range, while symptoms of depression were also within the mild range. A screening instrument assessing recent sleep quality suggested the presence of minimal sleep dysfunction. Across childhood symptoms of ADHD, his responses scored in the "likely to have ADHD" range across symptoms of hyperactivity/impulsivity and in the "unlikely to have ADHD" range across symptoms of inattention.   Tables of Scores:   Note: This summary of test scores accompanies the interpretive report and should not be considered in isolation without reference to the  appropriate sections in the text. Descriptors are based on appropriate normative data and may be adjusted based on clinical judgment. Terms such as "Within Normal Limits" and "Outside Normal Limits" are used when a more specific description of the test score cannot be determined.       Percentile -  Normative Descriptor > 98 - Exceptionally High 91-97 - Well Above Average 75-90 - Above Average 25-74 - Average 9-24 - Below Average 2-8 - Well Below Average < 2 - Exceptionally Low       Validity:   DESCRIPTOR       Dot Counting Test: --- --- Within Normal Limits  NAB EVI: --- --- Within Normal Limits  D-KEFS Color Word Effort Index: --- --- Within Normal Limits       Orientation:      Raw Score Percentile   NAB Orientation, Form 1 28/29 --- ---       Cognitive Screening:      Raw Score Percentile   SLUMS: 29/30 --- ---       Intellectual Functioning:      Standard Score Percentile   Barona Formula Estimated Premorbid IQ 119 90 Above Average        Standard Score Percentile   Test of Premorbid Functioning: 129 55 Well Above Average       Memory:     NAB Memory Module, Form 1: Standard Score/ T Score Percentile   Total Memory Index 82 12 Below Average  List Learning       Total Trials 1-3 21/36 (46) 34 Average    List B 2/12 (34) 5 Well Below Average    Short Delay Free Recall 3/12 (30) 2 Well Below Average    Long Delay Free Recall 0/12 (23) <1 Exceptionally Low    Retention Percentage 0 (22) <1 Exceptionally Low    Recognition Discriminability 5 (45) 31 Average  Shape Learning       Total Trials 1-3 17/27 (55) 69 Average    Delayed Recall 4/9 (40) 16 Below Average    Retention Percentage 57 (38) 12 Below Average    Recognition Discriminability 4 (36) 8 Well Below Average  Story Learning       Immediate Recall 50/80 (40) 16 Below Average    Delayed Recall 23/40 (36) 8 Well Below Average    Retention Percentage 77 (43) 25 Average  Daily Living Memory       Immediate  Recall 45/51 (56) 73 Average    Delayed Recall 14/17 (50) 50 Average    Retention Percentage 82 (48) 42 Average    Recognition Hits 9/10 (53) 62 Average       Attention/Executive Function:     Trail Making Test (TMT): Raw Score (T Score) Percentile     Part A 48 secs.,  0 errors (38) 12 Below Average    Part B 57 secs.,  0 errors (60) 84 Above Average        Symbol Digit Modalities Test (SDMT): Raw Score (Z-Score) Percentile     Written 42 (48.7) 45 Average    Oral 54 (50.8) 53 Average       NAB Attention Module, Form 1: T Score Percentile     Digits Forward 58 79 Above Average    Digits Backwards 54 66 Average       Conners CPT 3: T Score Percentile      d' 39 14 Low    Omissions 44 27 Low    Commissions 44 27 Low    Perseverations 44 27 Low    HRT 54 66 Average    HRT SD 44 27 Low    Variability 47 38 Average        Scaled Score Percentile   WAIS-IV Similarities: 13 84 Above Average  D-KEFS Color-Word Interference Test: Raw Score (Scaled Score) Percentile     Color Naming 24 secs. (14) 91 Well Above Average    Word Reading 18 secs. (14) 91 Well Above Average    Inhibition 46 secs. (14) 50 Well Above Average      Total Errors 2 errors (11) 63 Average    Inhibition/Switching 48 secs. (14) 31 Well Above Average      Total Errors 1 error (12) 75 Above Average       NAB Executive Functions Module, Form 1: T Score Percentile     Judgment 48 42 Average       Language:     Verbal Fluency Test: Raw Score (T Score) Percentile     Phonemic Fluency (FAS) 64 (70) 98 Exceptionally High    Animal Fluency 25 (64) 92 Well Above Average        NAB Language Module, Form 1: T Score Percentile     Naming 31/31 (57) 75 Above Average       Visuospatial/Visuoconstruction:      Raw Score Percentile   Clock Drawing: 10/10 --- Within Normal Limits       NAB Spatial Module, Form 1: T Score Percentile     Figure Drawing Copy 60 84 Above Average        Scaled Score Percentile    WAIS-IV Block Design: 12 75 Above Average       Mood and Personality:      Raw Score Percentile   Geriatric Depression Scale: 10 --- Mild  Geriatric Anxiety Scale: 17 --- Mild    Somatic 6 --- Mild    Cognitive 4 --- Mild    Affective 7 --- Moderate       Additional Questionnaires:      Raw Score Percentile   Adult Self-Report Scale (Current):       Inattention 24 --- Highly likely to have ADHD    Hyperactive/Impulsive 11 --- Unlikely to have ADHD  Adult Self-Report Scale (Childhood):       Inattention 12 --- Unlikely to have ADHD    Hyperactive/Impulsive 17 --- Likely to have ADHD       PROMIS Sleep Disturbance Questionnaire: 14 --- None to Slight   Informed Consent and Coding/Compliance:   The current evaluation represents a clinical evaluation for the purposes previously outlined by the referral source and is in no way reflective of a forensic evaluation.   Mr. Humber was provided with a verbal description of the nature and purpose of the present neuropsychological evaluation. Also reviewed were the foreseeable risks and/or discomforts and benefits of the procedure, limits of confidentiality, and mandatory reporting requirements of this provider. The patient was given the opportunity to ask questions and receive answers about the evaluation. Oral consent to participate was provided by the patient.   This evaluation was conducted by Christia Reading, Ph.D., licensed clinical neuropsychologist. Mr. Kuzniar completed a clinical interview with Dr. Melvyn Novas, billed as one unit [phone], and 135 minutes of cognitive testing and scoring, billed as one unit [phone] and four additional units [id number]. Psychometrist Milana Kidney, B.S., assisted Dr. Melvyn Novas with test administration and scoring procedures. As a separate and discrete service, Dr. Melvyn Novas spent a total of 160 minutes in interpretation and report writing billed as one unit [phone] and two units [id number].

## 2020-07-24 ENCOUNTER — Ambulatory Visit (INDEPENDENT_AMBULATORY_CARE_PROVIDER_SITE_OTHER): Payer: Medicare (Managed Care) | Admitting: Psychology

## 2020-07-24 ENCOUNTER — Other Ambulatory Visit: Payer: Self-pay

## 2020-07-24 DIAGNOSIS — F3170 Bipolar disorder, currently in remission, most recent episode unspecified: Secondary | ICD-10-CM | POA: Diagnosis not present

## 2020-07-24 DIAGNOSIS — G251 Drug-induced tremor: Secondary | ICD-10-CM | POA: Diagnosis not present

## 2020-07-24 DIAGNOSIS — R41840 Attention and concentration deficit: Secondary | ICD-10-CM

## 2020-07-24 NOTE — Progress Notes (Signed)
   Neuropsychology Feedback Session Tillie Rung. [place] Department of Neurology  Reason for Referral:   Brian Guerrero is a 70 y.o. right-handed Caucasian male referred by Alonza Bogus, D.O., to characterize his current cognitive functioning and assist with diagnostic clarity and treatment planning in the context of subjective cognitive decline and concerns surrounding medication-induced tremor.  Feedback:   Brian Guerrero completed a comprehensive neuropsychological evaluation on 07/18/2020. Please refer to that encounter for the full report and recommendations. Briefly, results suggested performance variability and a relative weakness across processing speed, as well as learning and memory (particularly retrieval and recognition/consolidation aspects of memory). Performance across all other domains were appropriate. Trouble with forgetfulness and distractibility can be seen in individuals currently taking lithium. Outside of instances where documented lithium toxicity has occurred, these cognitive changes are generally mild. Overall, medication side effects, coupled with his history of bipolar disorder and report of acutely mild psychiatric distress, represents the most likely cause for cognitive dysfunction at the present time. I do not believe that Brian Guerrero has diagnosable ADHD. However, I do agree with Dr. Casimiro Needle conceptualization that trouble with processing speed and concentration created by variably managed bipolar disorder and anxiety over the years can mimic or feel like this condition.  Brian Guerrero was unaccompanied during the current feedback session. Content of the current session focused on the results of his neuropsychological evaluation. Brian Guerrero was given the opportunity to ask questions and his questions were answered. He was encouraged to reach out should additional questions arise. A copy of his report was provided at the conclusion of the visit.       20 minutes were spent conducting the current feedback session with Brian Guerrero, billed as one unit [phone].

## 2020-08-26 ENCOUNTER — Encounter: Payer: Self-pay | Admitting: Psychiatry

## 2020-08-26 ENCOUNTER — Other Ambulatory Visit (HOSPITAL_COMMUNITY): Payer: Self-pay

## 2020-08-26 ENCOUNTER — Other Ambulatory Visit: Payer: Self-pay

## 2020-08-26 ENCOUNTER — Ambulatory Visit (INDEPENDENT_AMBULATORY_CARE_PROVIDER_SITE_OTHER): Payer: Medicare (Managed Care) | Admitting: Psychiatry

## 2020-08-26 VITALS — BP 121/68 | HR 60

## 2020-08-26 DIAGNOSIS — F900 Attention-deficit hyperactivity disorder, predominantly inattentive type: Secondary | ICD-10-CM

## 2020-08-26 DIAGNOSIS — F411 Generalized anxiety disorder: Secondary | ICD-10-CM

## 2020-08-26 DIAGNOSIS — F319 Bipolar disorder, unspecified: Secondary | ICD-10-CM

## 2020-08-26 DIAGNOSIS — F5105 Insomnia due to other mental disorder: Secondary | ICD-10-CM

## 2020-08-26 DIAGNOSIS — G251 Drug-induced tremor: Secondary | ICD-10-CM | POA: Diagnosis not present

## 2020-08-26 DIAGNOSIS — Z79899 Other long term (current) drug therapy: Secondary | ICD-10-CM

## 2020-08-26 DIAGNOSIS — R7989 Other specified abnormal findings of blood chemistry: Secondary | ICD-10-CM

## 2020-08-26 MED ORDER — PROPRANOLOL HCL 10 MG PO TABS
ORAL_TABLET | ORAL | 0 refills | Status: DC
  Filled 2020-08-26: qty 540, 68d supply, fill #0

## 2020-08-26 MED ORDER — QUETIAPINE FUMARATE 300 MG PO TABS
ORAL_TABLET | Freq: Every day | ORAL | 1 refills | Status: DC
  Filled 2020-08-26: qty 90, fill #0
  Filled 2020-10-09: qty 90, 90d supply, fill #0

## 2020-08-26 MED ORDER — METHYLPHENIDATE HCL ER (OSM) 36 MG PO TBCR
36.0000 mg | EXTENDED_RELEASE_TABLET | Freq: Every day | ORAL | 0 refills | Status: DC
  Filled 2020-08-26: qty 30, 30d supply, fill #0

## 2020-08-26 MED ORDER — BUPROPION HCL ER (XL) 300 MG PO TB24
300.0000 mg | ORAL_TABLET | Freq: Every day | ORAL | 0 refills | Status: DC
  Filled 2020-08-26: qty 90, 90d supply, fill #0

## 2020-08-26 NOTE — Progress Notes (Signed)
Brian Guerrero [id number] 1950-04-17 70 y.o.     Subjective:   Patient ID:  Brian Guerrero is a 70 y.o. (DOB 1950/06/10) male.  Chief Complaint:  Chief Complaint  Patient presents with   Follow-up   Depression    Depression        Associated symptoms include no decreased concentration and no suicidal ideas.  Past medical history includes anxiety.   Anxiety Patient reports no chest pain, confusion, decreased concentration, nervous/anxious behavior, palpitations or suicidal ideas.     Brian Guerrero presents to the office today for follow-up of med change.  At visit  March 07, 2018.  For problems with attention and focus Concerta was increased from 27 mg a day to 30 mg a day.  In January we reduced the Wellbutrin and added Concerta 27.  More stamina and more alert but still struggle with concentration and memory and tremor.  Can't say if it's new or a lifelong thing.  Tolerating the Concerta OK.  Thinks he could tolerate an increase in the Concerta.  Occ propranolol for tremor.  seen Jun 02, 2018.  No meds were changed. He had received benefit from the increase Concerta given in March.  Last seen 10/05/18.  Has stopped Concerta bc cost prohibitive.  Taking instead Ritalin and can't remember it TID.  Does get benefit but liked Concerta better.  Overall good and likes retirement.  Was too stressed at work before..  Tremors resolved.  Doing hobbies and travel. [name] concerned about episode of mania she noticed in May.  Reduced sleep, faster driving, spending and talking faster.  Not more severe.  Lasted a few weeks and resolved after switch to Ritalin.  She doesn't see this now.  He didn't think it lasted more than a few days.  She thinks he didn't recognize it.  It was not severe. No med changes.  05/16/19 appt noted.\ Doing well with mood.  Liked Concerta better and hard to take 3 daily of Ritalin.  Otherwise no med changes.  Cataract surgery helped. Enjoyed  retirements. Patient reports stable mood and denies depressed or irritable moods.  Patient denies any recent difficulty with anxiety. Rare anxiety.   Patient denies difficulty with sleep initiation or maintenance. Denies appetite disturbance but he's had 20# weight loss. He's getting less sugar in retirement and stays active.  Patient reports that energy and motivation have been good.  Patient denies any difficulty with concentration.  Patient denies any suicidal ideation.  Sleep 8 hours.  Not oversleeping. Retired at 70 yo. Feels good about it.  Enjoying it so far.  Working on new schedule.  Tackling some home projects. Motivation is better.  No depression.   Plan no med changes:  02/26/2020 appointment with following noted: It is been a number of months since the last appointment.  Longer than requested by MD. Doing well and liking retirement. Motivation is still not great but Ritalin helps.   Tremor no worse. "As always, wanting to ask about cutting back on lithium and quetiapine bc under much less stress with retirement."  Didn't realize how much he was stressed. Quetiapine very effective for sleep and can't sleep if misses a dose. Admits he decreased quetiapine from 600 to 450 mg HS months ago. Patient reports stable mood and denies depressed or irritable moods.  Patient denies any recent difficulty with anxiety.  Patient denies difficulty with sleep initiation or maintenance. Denies appetite disturbance.  Patient reports that energy and motivation have been good.  Patient denies any difficulty with concentration.  Patient denies any suicidal ideation. Plan: Continue lithium 600 mg daily and  OK to try reducing quetiapine to 300 mg nightly for bipolar depression. Continue Wellbutrin to 300 mg daily and  Ritalin 10 TID mg daily.    08/26/2020 appointment with the following noted: Seems to be working fine.  Rare sleep problems.  No SE with meds and no hangover. Overall good mood.  [name] noticed 3  day period of hypomania and he tried to address by increasing sleep and it resolved.  Not totally clear of depressive sx but not debilitating and Ritalin clearly helps functions and  alertness. Still some tremor. 1-2 cups coffee in AM.  Past Psychiatric Medication Trials: Concerta 36,  Ritalin 10 TID Propranolol 20 AM  Review of Systems:  Review of Systems  Cardiovascular:  Negative for chest pain and palpitations.  Neurological:  Positive for tremors. Negative for weakness.  Psychiatric/Behavioral:  Negative for agitation, behavioral problems, confusion, decreased concentration, dysphoric mood, hallucinations, self-injury, sleep disturbance and suicidal ideas. The patient is not nervous/anxious and is not hyperactive.  tremor variable worse with caffeine  Medications: I have reviewed the patient's current medications.  Current Outpatient Medications  Medication Sig Dispense Refill   Acetylcysteine (NAC 600 PO) Take 1,200 mg by mouth daily.     aspirin EC 81 MG tablet Take 81 mg by mouth daily.     Carnitine-B5-B6 (L-CARNITINE 500 PO) Take 1,000 mg by mouth daily.     finasteride (PROSCAR) 5 MG tablet TAKE 1 TABLET BY MOUTH ONCE DAILY 90 tablet 3   lithium 600 MG capsule Take 600 mg by mouth daily.     methylphenidate (CONCERTA) 36 MG PO CR tablet Take 1 tablet (36 mg total) by mouth daily. 30 tablet 0   Pyridoxine HCl (VITAMIN B6) 100 MG TABS Take 100 mg by mouth daily.     sildenafil (VIAGRA) 100 MG tablet TAKE 1 TABLET BY MOUTH AS NEEDED AS DIRECTED 30 tablet 11   buPROPion (WELLBUTRIN XL) 300 MG 24 hr tablet Take 1 tablet (300 mg total) by mouth daily. 90 tablet 0   methylphenidate (RITALIN) 10 MG tablet TAKE 1 TABLET BY MOUTH 3 TIMES DAILY WITH A MEAL 270 tablet 0   propranolol (INDERAL) 10 MG tablet 2-4 tablets twice daily as needed for tremor 540 tablet 0   QUEtiapine (SEROQUEL) 300 MG tablet TAKE 1 TABLET BY MOUTH AT BEDTIME 90 tablet 1   No current facility-administered  medications for this visit.    Medication Side Effect:  CO sluggish in the morning. Tremor returned.  Allergies:  Allergies  Allergen Reactions   Lincomycin Hcl     As a child/had a hive    Past Medical History:  Diagnosis Date   Attention or concentration deficit    Bipolar disorder    BPH (benign prostatic hyperplasia)    Diverticular disease    [facility]/per pt not aware of this   Lithium-induced tremor    Varicose veins    Wears glasses     Family History  Problem Relation Age of Onset   Depression Mother    Prostate cancer Father    Kidney disease Father    Dementia Father        unspecified type; sympton onset in late 80s/early 40s   Tremor Father    Parkinson's disease Sister     Social History   Socioeconomic History   Marital status: Married    Spouse name:  Not on file   Number of children: Not on file   Years of education: 17   Highest education level: Bachelor's degree (e.g., BA, AB, BS)  Occupational History   Not on file  Tobacco Use   Smoking status: Never   Smokeless tobacco: Never  Vaping Use   Vaping Use: Never used  Substance and Sexual Activity   Alcohol use: Not Currently    Alcohol/week: 7.0 standard drinks    Types: 7 Cans of beer per week    Comment: 1 beer nightly   Drug use: Not Currently   Sexual activity: Not on file  Other Topics Concern   Not on file  Social History Narrative   Not on file   Social Determinants of Health   Financial Resource Strain: Not on file  Food Insecurity: Not on file  Transportation Needs: Not on file  Physical Activity: Not on file  Stress: Not on file  Social Connections: Not on file  Intimate Partner Violence: Not on file    Past Medical History, Surgical history, Social history, and Family history were reviewed and updated as appropriate.   Please see review of systems for further details on the patient's review from today.   Objective:   Physical Exam:  BP 121/68   Pulse 60    Physical Exam Constitutional:      General: He is not in acute distress. Musculoskeletal:        General: No deformity.  Neurological:     Mental Status: He is alert and oriented to person, place, and time.     Cranial Nerves: No dysarthria.     Motor: Tremor present.     Coordination: Coordination normal.  Psychiatric:        Attention and Perception: Attention and perception normal. He does not perceive auditory or visual hallucinations.        Mood and Affect: Mood normal. Mood is not anxious or depressed. Affect is not labile, blunt, angry or inappropriate.        Speech: Speech normal.        Behavior: Behavior normal. Behavior is not slowed. Behavior is cooperative.        Thought Content: Thought content normal. Thought content is not paranoid or delusional. Thought content does not include homicidal or suicidal ideation. Thought content does not include homicidal or suicidal plan.        Cognition and Memory: Cognition and memory normal.        Judgment: Judgment normal.     Comments: Insight fair.  No mania    Lab Review:     Component Value Date/Time   NA 139 08/31/2019 1631   K 4.3 08/31/2019 1631   CL 105 08/31/2019 1631   CO2 21 08/31/2019 1631   GLUCOSE 93 08/31/2019 1631   GLUCOSE 93 04/26/2013 0930   BUN 28 (H) 08/31/2019 1631   CREATININE 1.02 08/31/2019 1631   CALCIUM 9.3 08/31/2019 1631   GFRNONAA 75 08/31/2019 1631   GFRAA 86 08/31/2019 1631       Component Value Date/Time   WBC 6.4 04/26/2013 0930   RBC 4.69 04/26/2013 0930   HGB 14.6 04/26/2013 0930   HCT 42.3 04/26/2013 0930   PLT 266 04/26/2013 0930   MCV 90.2 04/26/2013 0930   MCH 31.1 04/26/2013 0930   MCHC 34.5 04/26/2013 0930   RDW 13.4 04/26/2013 0930   LYMPHSABS 1.2 04/26/2013 0930   MONOABS 0.7 04/26/2013 0930   EOSABS 0.3 04/26/2013 0930  BASOSABS 0.0 04/26/2013 0930    Lithium Lvl  Date Value Ref Range Status  08/31/2019 0.3 (L) 0.5 - 1.2 mmol/L Final    Comment:     Plasma concentration of 0.5 - 0.8 mmol/L are advised for long-term use; concentrations of up to 1.2 mmol/L may be necessary during acute treatment.                                  Detection Limit = 0.1                           <0.1 indicates None Detected    08/31/19 level at 430PM on dose 600 HS  No results found for: PHENYTOIN, PHENOBARB, VALPROATE, CBMZ   .res Assessment: Plan:    Bipolar I disorder ([place]) - Plan: Lithium level, methylphenidate (CONCERTA) 36 MG PO CR tablet, QUEtiapine (SEROQUEL) 300 MG tablet, buPROPion (WELLBUTRIN XL) 300 MG 24 hr tablet  Generalized anxiety disorder  Attention deficit hyperactivity disorder (ADHD), predominantly inattentive type - Plan: methylphenidate (CONCERTA) 36 MG PO CR tablet, buPROPion (WELLBUTRIN XL) 300 MG 24 hr tablet  Lithium-induced tremor - Plan: propranolol (INDERAL) 10 MG tablet  Insomnia due to mental condition  Lithium use  Low vitamin D level - Plan: VITAMIN D 25 Hydroxy (Vit-D Deficiency, Fractures)   I believe that his problems with concentration are related to a type of acquired ADD due to the consequence of chronic bipolar depression with inadequate response to typical bipolar disorder medications as well as Wellbutrin.  Since this concentration problem was serious enough to be affecting his job function and potentially forcing retirement I offered him the option of treating it with stimulants.  He agreed and has obtained substantial benefit from the Concerta especially after increasing from 27 mg up to 36 mg daily.  He has retired now but still needs the stimulant to help with concentration and focus, motivation and productivity at home. Wants to see again if Concerta could be affordable in place of Ritalin bc smoother and longer acting.  OK 36 mg  Disc importance of staying active to manage risk of depression.    Discussed potential benefits, risks, and side effects of stimulants with patient to include increased heart  rate, palpitations, insomnia, increased anxiety, increased irritability, or decreased appetite.  Instructed patient to contact office if experiencing any significant tolerability issues.    Continue Wellbutrin to 300 mg daily and  Ritalin 10 TID mg daily.  If there is any jitteriness reduce the Wellbutrin again 150 mg daily.  If there is still side effects call.  Tremor related to medications have recurred and he saw neurologist and disc that and neuropsych testing. Propranolol for tremor increase to 20-40 mg BID prn  Disc SE in detail.  Continue lithium 600 mg daily and  Cont quetiapine to 300 mg nightly for bipolar depression. Reduction does have some risk of mania or depression.  Discussed potential metabolic side effects associated with atypical antipsychotics, as well as potential risk for movement side effects. Advised pt to contact office if movement side effects occur.   Counseled patient regarding potential benefits, risks, and side effects of lithium to include potential risk of lithium affecting thyroid and renal function.  Discussed need for periodic lab monitoring to determine drug level and to assess for potential adverse effects.  Counseled patient regarding signs and symptoms of lithium toxicity and  advised that they notify office immediately or seek urgent medical attention if experiencing these signs and symptoms.  Patient advised to contact office with any questions or concerns.  Recommend consistent use of propranolol before work to help with the tremor.  He has not done that but it is his choice. Saw Dr. Carles Collet for tremor and she also suggested neuropsych testing for memory concerns. Disc results.  Check lithium level  His vitamin D level was 36 in past and needs to be repeated and consideration could be given to increasing vitamin D.  FU 3 - 4 mos  Lynder Parents, MD, DFAPA   Future Appointments  Date Time Provider [place]  04/14/2021 10:45 AM Tat, Eustace Quail,  DO [facility] None    Orders Placed This Encounter  Procedures   Lithium level   VITAMIN D 25 Hydroxy (Vit-D Deficiency, Fractures)      -------------------------------

## 2020-08-29 ENCOUNTER — Other Ambulatory Visit (HOSPITAL_COMMUNITY): Payer: Self-pay

## 2020-08-29 ENCOUNTER — Telehealth: Payer: Self-pay

## 2020-08-29 MED FILL — Finasteride Tab 5 MG: ORAL | 90 days supply | Qty: 90 | Fill #1 | Status: AC

## 2020-08-29 NOTE — Telephone Encounter (Signed)
Pt submitted for a Tier Reduction on his Methlphenidate ER 36 mg, form received in office and completed faxed back to [facility] as requested. Pt called with a fax # instead of what was listed on form [phone].

## 2020-08-30 LAB — LITHIUM LEVEL: Lithium Lvl: 0.4 mmol/L — ABNORMAL LOW (ref 0.6–1.2)

## 2020-09-01 ENCOUNTER — Telehealth: Payer: Self-pay

## 2020-09-02 NOTE — Telephone Encounter (Signed)
Brian Guerrero is this something we need to do?

## 2020-09-03 NOTE — Telephone Encounter (Signed)
Please review messages

## 2020-09-03 NOTE — Telephone Encounter (Signed)
I think you just need to ask Dr. Clovis Pu to write him a brief note about this on an RX pad and give to the patient.

## 2020-09-03 NOTE — Telephone Encounter (Signed)
He was not able to get the lab done at all without this statement of medical necessity

## 2020-09-04 NOTE — Telephone Encounter (Signed)
I wrote a note about medical necessity on RX pad.  Please get it to him.

## 2020-09-23 ENCOUNTER — Other Ambulatory Visit (HOSPITAL_COMMUNITY): Payer: Self-pay

## 2020-10-09 ENCOUNTER — Other Ambulatory Visit (HOSPITAL_COMMUNITY): Payer: Self-pay

## 2020-10-09 ENCOUNTER — Other Ambulatory Visit: Payer: Self-pay | Admitting: Psychiatry

## 2020-10-09 DIAGNOSIS — F319 Bipolar disorder, unspecified: Secondary | ICD-10-CM

## 2020-10-09 DIAGNOSIS — F900 Attention-deficit hyperactivity disorder, predominantly inattentive type: Secondary | ICD-10-CM

## 2020-10-09 MED FILL — Finasteride Tab 5 MG: ORAL | 90 days supply | Qty: 90 | Fill #2 | Status: CN

## 2020-10-10 ENCOUNTER — Other Ambulatory Visit (HOSPITAL_COMMUNITY): Payer: Self-pay

## 2020-10-10 MED ORDER — METHYLPHENIDATE HCL ER (OSM) 36 MG PO TBCR
36.0000 mg | EXTENDED_RELEASE_TABLET | Freq: Every day | ORAL | 0 refills | Status: DC
  Filled 2020-10-10: qty 30, 30d supply, fill #0

## 2020-10-10 MED ORDER — BUPROPION HCL ER (XL) 300 MG PO TB24
300.0000 mg | ORAL_TABLET | Freq: Every day | ORAL | 0 refills | Status: DC
  Filled 2020-10-10 – 2020-10-11 (×2): qty 90, 90d supply, fill #0

## 2020-10-10 MED ORDER — LITHIUM CARBONATE 300 MG PO CAPS
ORAL_CAPSULE | Freq: Two times a day (BID) | ORAL | 0 refills | Status: DC
  Filled 2020-10-10: qty 180, 90d supply, fill #0

## 2020-10-10 NOTE — Telephone Encounter (Signed)
Concerta filled 05/31/10 appt on 12/20

## 2020-10-11 ENCOUNTER — Other Ambulatory Visit (HOSPITAL_COMMUNITY): Payer: Self-pay

## 2020-10-13 ENCOUNTER — Other Ambulatory Visit (HOSPITAL_COMMUNITY): Payer: Self-pay

## 2020-10-13 MED ORDER — AMOXICILLIN 250 MG PO CAPS
ORAL_CAPSULE | ORAL | 1 refills | Status: DC
  Filled 2020-10-13: qty 40, 10d supply, fill #0

## 2020-10-31 ENCOUNTER — Other Ambulatory Visit (HOSPITAL_COMMUNITY): Payer: Self-pay

## 2020-11-20 ENCOUNTER — Other Ambulatory Visit (HOSPITAL_COMMUNITY): Payer: Self-pay

## 2020-11-20 ENCOUNTER — Other Ambulatory Visit: Payer: Self-pay | Admitting: Psychiatry

## 2020-11-20 DIAGNOSIS — F900 Attention-deficit hyperactivity disorder, predominantly inattentive type: Secondary | ICD-10-CM

## 2020-11-20 DIAGNOSIS — G251 Drug-induced tremor: Secondary | ICD-10-CM

## 2020-11-20 MED FILL — Finasteride Tab 5 MG: ORAL | 90 days supply | Qty: 90 | Fill #2 | Status: AC

## 2020-11-20 NOTE — Telephone Encounter (Signed)
Last filled 10/8 appt on 12/20

## 2020-11-21 ENCOUNTER — Other Ambulatory Visit (HOSPITAL_COMMUNITY): Payer: Self-pay

## 2020-11-21 MED ORDER — PROPRANOLOL HCL 10 MG PO TABS
ORAL_TABLET | ORAL | 0 refills | Status: DC
Start: 2020-11-21 — End: 2023-11-02
  Filled 2020-11-21: qty 540, 67d supply, fill #0

## 2020-11-21 MED ORDER — METHYLPHENIDATE HCL 10 MG PO TABS
ORAL_TABLET | ORAL | 0 refills | Status: DC
Start: 2020-11-21 — End: 2021-07-28
  Filled 2020-11-21: qty 270, 90d supply, fill #0

## 2020-11-24 ENCOUNTER — Other Ambulatory Visit (HOSPITAL_COMMUNITY): Payer: Self-pay

## 2020-12-16 LAB — VITAMIN D 25 HYDROXY (VIT D DEFICIENCY, FRACTURES): Vit D, 25-Hydroxy: 38 ng/mL (ref 30–100)

## 2020-12-23 ENCOUNTER — Other Ambulatory Visit (HOSPITAL_COMMUNITY): Payer: Self-pay

## 2020-12-23 ENCOUNTER — Encounter: Payer: Self-pay | Admitting: Psychiatry

## 2020-12-23 ENCOUNTER — Ambulatory Visit (INDEPENDENT_AMBULATORY_CARE_PROVIDER_SITE_OTHER): Payer: Medicare (Managed Care) | Admitting: Psychiatry

## 2020-12-23 ENCOUNTER — Other Ambulatory Visit: Payer: Self-pay

## 2020-12-23 DIAGNOSIS — F319 Bipolar disorder, unspecified: Secondary | ICD-10-CM | POA: Diagnosis not present

## 2020-12-23 DIAGNOSIS — G251 Drug-induced tremor: Secondary | ICD-10-CM | POA: Diagnosis not present

## 2020-12-23 DIAGNOSIS — F411 Generalized anxiety disorder: Secondary | ICD-10-CM | POA: Diagnosis not present

## 2020-12-23 DIAGNOSIS — Z79899 Other long term (current) drug therapy: Secondary | ICD-10-CM

## 2020-12-23 DIAGNOSIS — R7989 Other specified abnormal findings of blood chemistry: Secondary | ICD-10-CM

## 2020-12-23 DIAGNOSIS — F900 Attention-deficit hyperactivity disorder, predominantly inattentive type: Secondary | ICD-10-CM

## 2020-12-23 DIAGNOSIS — F5105 Insomnia due to other mental disorder: Secondary | ICD-10-CM

## 2020-12-23 MED ORDER — BUPROPION HCL ER (XL) 300 MG PO TB24
300.0000 mg | ORAL_TABLET | Freq: Every day | ORAL | 0 refills | Status: DC
  Filled 2020-12-23: qty 90, 90d supply, fill #0

## 2020-12-23 MED ORDER — QUETIAPINE FUMARATE 300 MG PO TABS
ORAL_TABLET | Freq: Every day | ORAL | 1 refills | Status: DC
  Filled 2020-12-23: qty 90, 90d supply, fill #0
  Filled 2021-04-08: qty 90, 90d supply, fill #1

## 2020-12-23 MED ORDER — LITHIUM CARBONATE 300 MG PO CAPS
ORAL_CAPSULE | Freq: Two times a day (BID) | ORAL | 0 refills | Status: DC
  Filled 2020-12-23: qty 180, 90d supply, fill #0

## 2020-12-23 MED ORDER — METHYLPHENIDATE HCL ER (OSM) 36 MG PO TBCR: 36.0000 mg | EXTENDED_RELEASE_TABLET | Freq: Every day | ORAL | 0 refills | Status: DC

## 2020-12-23 NOTE — Progress Notes (Signed)
Brian Guerrero [id number] 1950/03/31 70 y.o.     Subjective:   Patient ID:  Brian Guerrero is a 70 y.o. (DOB 12-14-1950) male.  Chief Complaint:  Chief Complaint  Patient presents with   Follow-up    Bipolar I disorder ([place])   ADHD    Depression        Associated symptoms include no decreased concentration and no suicidal ideas.  Past medical history includes anxiety.   Anxiety Patient reports no chest pain, confusion, decreased concentration, nervous/anxious behavior, palpitations, shortness of breath or suicidal ideas.     Brian Guerrero presents to the office today for follow-up of med change.  At visit  March 07, 2018.  For problems with attention and focus Concerta was increased from 27 mg a day to 30 mg a day.  In January we reduced the Wellbutrin and added Concerta 27.  More stamina and more alert but still struggle with concentration and memory and tremor.  Can't say if it's new or a lifelong thing.  Tolerating the Concerta OK.  Thinks he could tolerate an increase in the Concerta.  Occ propranolol for tremor.  seen Jun 02, 2018.  No meds were changed. He had received benefit from the increase Concerta given in March.  seen 10/05/18.  Has stopped Concerta bc cost prohibitive.  Taking instead Ritalin and can't remember it TID.  Does get benefit but liked Concerta better.  Overall good and likes retirement.  Was too stressed at work before..  Tremors resolved.  Doing hobbies and travel. Brian Guerrero concerned about episode of mania she noticed in May.  Reduced sleep, faster driving, spending and talking faster.  Not more severe.  Lasted a few weeks and resolved after switch to Ritalin.  She doesn't see this now.  He didn't think it lasted more than a few days.  She thinks he didn't recognize it.  It was not severe. No med changes.  05/16/19 appt noted.\ Doing well with mood.  Liked Concerta better and hard to take 3 daily of Ritalin.  Otherwise no med changes.  Cataract  surgery helped. Enjoyed retirements. Patient reports stable mood and denies depressed or irritable moods.  Patient denies any recent difficulty with anxiety. Rare anxiety.   Patient denies difficulty with sleep initiation or maintenance. Denies appetite disturbance but he's had 20# weight loss. He's getting less sugar in retirement and stays active.  Patient reports that energy and motivation have been good.  Patient denies any difficulty with concentration.  Patient denies any suicidal ideation.  Sleep 8 hours.  Not oversleeping. Retired at 70 yo. Feels good about it.  Enjoying it so far.  Working on new schedule.  Tackling some home projects. Motivation is better.  No depression.   Plan no med changes:  02/26/2020 appointment with following noted: It is been a number of months since the last appointment.  Longer than requested by MD. Doing well and liking retirement. Motivation is still not great but Ritalin helps.   Tremor no worse. "As always, wanting to ask about cutting back on lithium and quetiapine bc under much less stress with retirement."  Didn't realize how much he was stressed. Quetiapine very effective for sleep and can't sleep if misses a dose. Admits he decreased quetiapine from 600 to 450 mg HS months ago. Patient reports stable mood and denies depressed or irritable moods.  Patient denies any recent difficulty with anxiety.  Patient denies difficulty with sleep initiation or maintenance. Denies appetite disturbance.  Patient reports that energy and motivation have been good.  Patient denies any difficulty with concentration.  Patient denies any suicidal ideation. Plan: Continue lithium 600 mg daily and  OK to try reducing quetiapine to 300 mg nightly for bipolar depression. Continue Wellbutrin to 300 mg daily and  Ritalin 10 TID mg daily.    08/26/2020 appointment with the following noted: Seems to be working fine.  Rare sleep problems.  No SE with meds and no hangover. Overall  good mood.  Brian Guerrero noticed 3 day period of hypomania and he tried to address by increasing sleep and it resolved.  Not totally clear of depressive sx but not debilitating and Ritalin clearly helps functions and  alertness. Still some tremor. 1-2 cups coffee in AM. Plan: Continue meds except Wants to see again if Concerta could be affordable in place of Ritalin bc smoother and longer acting.  OK 36 mg  Watch vitamin D level  12/23/2020 appointment with the following noted: Wants to try Concerta again instead of Ritalin 10 TID. Doing well otherwise.  Mood is ok.  Denies unusual mood swings but mild hypomania 4-5 days recenlty without being problematic. Had neuropsych testing. Memory stable.  Suggesting psychotherapy. Propranolol helps tremor.    Past Psychiatric Medication Trials: Concerta 36,  Ritalin 10 TID Propranolol 20 AM  Review of Systems:  Review of Systems  Respiratory:  Negative for shortness of breath.   Cardiovascular:  Negative for chest pain and palpitations.  Neurological:  Positive for tremors. Negative for weakness.  Psychiatric/Behavioral:  Negative for agitation, behavioral problems, confusion, decreased concentration, dysphoric mood, hallucinations, self-injury, sleep disturbance and suicidal ideas. The patient is not nervous/anxious and is not hyperactive.  tremor variable worse with caffeine  Medications: I have reviewed the patient's current medications.  Current Outpatient Medications  Medication Sig Dispense Refill   Acetylcysteine (NAC 600 PO) Take 1,200 mg by mouth daily.     aspirin EC 81 MG tablet Take 81 mg by mouth daily.     Carnitine-B5-B6 (L-CARNITINE 500 PO) Take 1,000 mg by mouth daily.     finasteride (PROSCAR) 5 MG tablet TAKE 1 TABLET BY MOUTH ONCE DAILY 90 tablet 3   [START ON 01/20/2021] methylphenidate (CONCERTA) 36 MG PO CR tablet Take 1 tablet (36 mg total) by mouth daily. 30 tablet 0   methylphenidate (RITALIN) 10 MG tablet TAKE 1 TABLET BY  MOUTH 3 TIMES DAILY WITH A MEAL 270 tablet 0   propranolol (INDERAL) 10 MG tablet Take 2-4 tablets by mouth twice daily as needed for tremor 540 tablet 0   Pyridoxine HCl (VITAMIN B6) 100 MG TABS Take 100 mg by mouth daily.     amoxicillin (AMOXIL) 250 MG capsule Take 1 capsule by mouth 4 times a day (Patient not taking: Reported on 12/23/2020) 40 capsule 1   buPROPion (WELLBUTRIN XL) 300 MG 24 hr tablet Take 1 tablet (300 mg total) by mouth daily. 90 tablet 0   lithium carbonate 300 MG capsule Take1 capsule by mouth 2 (two) times daily. 180 capsule 0   methylphenidate (CONCERTA) 36 MG PO CR tablet Take 1 tablet (36 mg total) by mouth daily. 30 tablet 0   QUEtiapine (SEROQUEL) 300 MG tablet TAKE 1 TABLET BY MOUTH AT BEDTIME 90 tablet 1   sildenafil (VIAGRA) 100 MG tablet TAKE 1 TABLET BY MOUTH AS NEEDED AS DIRECTED (Patient not taking: Reported on 12/23/2020) 30 tablet 11   No current facility-administered medications for this visit.    Medication Side  Effect:  CO sluggish in the morning. Tremor returned.  Allergies:  Allergies  Allergen Reactions   Lincomycin Hcl     As a child/had a hive    Past Medical History:  Diagnosis Date   Attention or concentration deficit    Bipolar disorder    BPH (benign prostatic hyperplasia)    Diverticular disease    [facility]/per pt not aware of this   Lithium-induced tremor    Varicose veins    Wears glasses     Family History  Problem Relation Age of Onset   Depression Mother    Prostate cancer Father    Kidney disease Father    Dementia Father        unspecified type; sympton onset in late 80s/early 32s   Tremor Father    Parkinson's disease Sister     Social History   Socioeconomic History   Marital status: Married    Spouse name: Not on file   Number of children: Not on file   Years of education: 17   Highest education level: Bachelor's degree (e.g., BA, AB, BS)  Occupational History   Not on file  Tobacco Use   Smoking  status: Never   Smokeless tobacco: Never  Vaping Use   Vaping Use: Never used  Substance and Sexual Activity   Alcohol use: Not Currently    Alcohol/week: 7.0 standard drinks    Types: 7 Cans of beer per week    Comment: 1 beer nightly   Drug use: Not Currently   Sexual activity: Not on file  Other Topics Concern   Not on file  Social History Narrative   Not on file   Social Determinants of Health   Financial Resource Strain: Not on file  Food Insecurity: Not on file  Transportation Needs: Not on file  Physical Activity: Not on file  Stress: Not on file  Social Connections: Not on file  Intimate Partner Violence: Not on file    Past Medical History, Surgical history, Social history, and Family history were reviewed and updated as appropriate.   Please see review of systems for further details on the patient's review from today.   Objective:   Physical Exam:  There were no vitals taken for this visit.  Physical Exam Constitutional:      General: He is not in acute distress. Musculoskeletal:        General: No deformity.  Neurological:     Mental Status: He is alert and oriented to person, place, and time.     Cranial Nerves: No dysarthria.     Motor: Tremor present.     Coordination: Coordination normal.  Psychiatric:        Attention and Perception: Attention and perception normal. He does not perceive auditory or visual hallucinations.        Mood and Affect: Mood normal. Mood is not anxious or depressed. Affect is not labile, blunt, angry or inappropriate.        Speech: Speech normal.        Behavior: Behavior normal. Behavior is not slowed. Behavior is cooperative.        Thought Content: Thought content normal. Thought content is not paranoid or delusional. Thought content does not include homicidal or suicidal ideation. Thought content does not include suicidal plan.        Cognition and Memory: Cognition and memory normal.        Judgment: Judgment normal.      Comments: Insight fair.  No mania    Lab Review:     Component Value Date/Time   NA 139 08/31/2019 1631   K 4.3 08/31/2019 1631   CL 105 08/31/2019 1631   CO2 21 08/31/2019 1631   GLUCOSE 93 08/31/2019 1631   GLUCOSE 93 04/26/2013 0930   BUN 28 (H) 08/31/2019 1631   CREATININE 1.02 08/31/2019 1631   CALCIUM 9.3 08/31/2019 1631   GFRNONAA 75 08/31/2019 1631   GFRAA 86 08/31/2019 1631       Component Value Date/Time   WBC 6.4 04/26/2013 0930   RBC 4.69 04/26/2013 0930   HGB 14.6 04/26/2013 0930   HCT 42.3 04/26/2013 0930   PLT 266 04/26/2013 0930   MCV 90.2 04/26/2013 0930   MCH 31.1 04/26/2013 0930   MCHC 34.5 04/26/2013 0930   RDW 13.4 04/26/2013 0930   LYMPHSABS 1.2 04/26/2013 0930   MONOABS 0.7 04/26/2013 0930   EOSABS 0.3 04/26/2013 0930   BASOSABS 0.0 04/26/2013 0930    Lithium Lvl  Date Value Ref Range Status  08/29/2020 0.4 (L) 0.6 - 1.2 mmol/L Final   08/31/19 level at 430PM on dose 600 HS  No results found for: PHENYTOIN, PHENOBARB, VALPROATE, CBMZ   .res Assessment: Plan:    Bipolar I disorder ([place]) - Plan: methylphenidate (CONCERTA) 36 MG PO CR tablet, buPROPion (WELLBUTRIN XL) 300 MG 24 hr tablet, lithium carbonate 300 MG capsule, QUEtiapine (SEROQUEL) 300 MG tablet  Generalized anxiety disorder  Attention deficit hyperactivity disorder (ADHD), predominantly inattentive type - Plan: methylphenidate (CONCERTA) 36 MG PO CR tablet, methylphenidate (CONCERTA) 36 MG PO CR tablet, buPROPion (WELLBUTRIN XL) 300 MG 24 hr tablet  Lithium-induced tremor  Low vitamin D level  Insomnia due to mental condition  Lithium use   I believe that his problems with concentration are related to a type of acquired ADD due to the consequence of chronic bipolar depression with inadequate response to typical bipolar disorder medications as well as Wellbutrin.  Since this concentration problem was serious enough to be affecting his job function and potentially forcing  retirement I offered him the option of treating it with stimulants.  He agreed and has obtained substantial benefit from the Concerta especially after increasing from 27 mg up to 36 mg daily.  He has retired now but still needs the stimulant to help with concentration and focus, motivation and productivity at home. Wants to see again if Concerta could be affordable in place of Ritalin bc smoother and longer acting.  OK 36 mg  Disc importance of staying active to manage risk of depression.    Discussed potential benefits, risks, and side effects of stimulants with patient to include increased heart rate, palpitations, insomnia, increased anxiety, increased irritability, or decreased appetite.  Instructed patient to contact office if experiencing any significant tolerability issues.    Continue Wellbutrin to 300 mg daily and  Ritalin 10 TID mg daily.  If there is any jitteriness reduce the Wellbutrin again 150 mg daily.  If there is still side effects call.  Tremor related to medications have recurred and he saw neurologist and disc that and neuropsych testing. Propranolol for tremor increase to 40 mg BID prn .  He reports it helps the tremor. Disc SE in detail.  Continue lithium 600 mg daily and  Cont quetiapine to 300 mg nightly for bipolar depression. Reduction does have some risk of mania or depression.  Discussed potential metabolic side effects associated with atypical antipsychotics, as well as potential risk for movement  side effects. Advised pt to contact office if movement side effects occur.   Counseled patient regarding potential benefits, risks, and side effects of lithium to include potential risk of lithium affecting thyroid and renal function.  Discussed need for periodic lab monitoring to determine drug level and to assess for potential adverse effects.  Counseled patient regarding signs and symptoms of lithium toxicity and advised that they notify office immediately or seek urgent  medical attention if experiencing these signs and symptoms.  Patient advised to contact office with any questions or concerns.  Recommend consistent use of propranolol before work to help with the tremor.  He has not done that but it is his choice. Saw Dr. Carles Collet for tremor and she also suggested neuropsych testing for memory concerns. Disc results from neuropsych testing and the suggestion about counseling.  Check lithium level  His vitamin D level was 36 in past and needs to be repeated and consideration could be given to increasing vitamin D. It was drawn last week. 38 Start 5000 units through winter and then drop back to 2000 units daily  FU 4 mos  Lynder Parents, MD, DFAPA   Future Appointments  Date Time Provider [place]  04/14/2021 10:45 AM Tat, Eustace Quail, DO [facility] None    No orders of the defined types were placed in this encounter.     -------------------------------

## 2020-12-23 NOTE — Progress Notes (Signed)
Pt informed: Start 5000 units through winter and then drop back to 2000 units daily of vitamin D

## 2020-12-23 NOTE — Patient Instructions (Addendum)
Counselors: Vivia Budge, [facility] [place] Dom, CSW  Start vitamin D 5000 units through winter and then drop back to 2000 units daily

## 2021-01-06 ENCOUNTER — Other Ambulatory Visit (HOSPITAL_COMMUNITY): Payer: Self-pay

## 2021-01-09 ENCOUNTER — Other Ambulatory Visit (HOSPITAL_COMMUNITY): Payer: Self-pay

## 2021-01-13 DIAGNOSIS — C44329 Squamous cell carcinoma of skin of other parts of face: Secondary | ICD-10-CM | POA: Diagnosis not present

## 2021-03-17 NOTE — Progress Notes (Signed)
?Assessment/Plan:  ? ?1.  Tremor ? -This is likely due to the lithium.  Patient is certainly on other medications that could contribute to tremor (quetiapine, methylphenidate) but it is likely the lithium that is the main source of tremor).  He finds propranolol '10mg'$ , 2 po q AM potentially helpful (not sure).  Without changing the lithium, I think that this is the best source of treatment. ? ?2.  Memory change ? -Neurocognitive testing in July, 2022 was unremarkable for any type of neurodegenerative process.  Repeat testing in 18 to 24 months from that time was recommended, if necessary. ? ?3.  Follow-up as needed. ? ? ? ?Subjective:  ? ?Brian Guerrero was seen in follow-up for likely lithium induced tremor.  He was seen about 1 year ago.  At that point in time, we discussed lithium induced tremor.  We discussed that his quetiapine and methylphenidate could be contributing to tremor as well, but felt that most tremor was likely due to lithium.  He was already being treated by psychiatry with propranolol for tremor, and patient thought it was effective.  I was leery about increasing that further because of patient's low blood pressure last visit.  Records from psychiatry are reviewed.  It is noted that patient was told that he could increase his propranolol from 20 mg daily to 40 mg twice daily if needed.  Patient reports he is taking 20 mg in the morning.  He is not sure that it is helpful, but potentially thinks it may be.  Patient has had neurocognitive testing done since our last visit.  This was done in July, 2022.  This was essentially unremarkable.  It was felt that his lithium could be interfering with some of the neurocognitive testing, but overall the testing was normal.  Cognitive behavioral therapy was recommended as one approach to treating his psychiatric distress.   ? ?Pt states today that tremor has actually gotten better, because he is slower and more deliberate when he writes.   ? ?No hx of  neuroimaging.   ? ?Allergies  ?Allergen Reactions  ? Lincomycin Hcl   ?  As a child/had a hive  ? ? ?Current Outpatient Medications  ?Medication Instructions  ? Acetylcysteine (NAC 600 PO) 1,200 mg, Oral, Daily  ? amoxicillin (AMOXIL) 250 MG capsule Take 1 capsule by mouth 4 times a day  ? aspirin EC 81 mg, Daily  ? buPROPion (WELLBUTRIN XL) 300 mg, Oral, Daily  ? Carnitine-B5-B6 (L-CARNITINE 500 PO) 1,000 mg, Oral, Daily  ? finasteride (PROSCAR) 5 MG tablet TAKE 1 TABLET BY MOUTH ONCE DAILY  ? lithium carbonate 300 MG capsule Take1 capsule by mouth 2 (two) times daily.  ? methylphenidate (CONCERTA) 36 mg, Oral, Daily  ? methylphenidate (CONCERTA) 36 mg, Oral, Daily  ? methylphenidate (RITALIN) 10 MG tablet TAKE 1 TABLET BY MOUTH 3 TIMES DAILY WITH A MEAL  ? propranolol (INDERAL) 10 MG tablet Take 2-4 tablets by mouth twice daily as needed for tremor  ? QUEtiapine (SEROQUEL) 300 MG tablet TAKE 1 TABLET BY MOUTH AT BEDTIME  ? sildenafil (VIAGRA) 100 MG tablet TAKE 1 TABLET BY MOUTH AS NEEDED AS DIRECTED  ? Vitamin B6 100 mg, Oral, Daily  ? ? ? ?Objective:  ? ?VITALS:   ?Vitals:  ? 03/19/21 0920  ?BP: 122/82  ?Pulse: 62  ?SpO2: 99%  ?Weight: 171 lb 6.4 oz (77.7 kg)  ?Height: '5\' 9"'$  (1.753 m)  ? ? ?Gen:  Appears stated age and in NAD. ?  HEENT:  Normocephalic, atraumatic. The mucous membranes are moist. The superficial temporal arteries are without ropiness or tenderness. ?Cardiovascular: brady.  regular ?Lungs: Clear to auscultation bilaterally. ?Neck: There are no carotid bruits noted bilaterally. ? ?NEUROLOGICAL: ? ?Orientation:  The patient is alert and oriented x 3.   ?Cranial nerves: There is good facial symmetry.  Hearing is intact to conversational tone. ?Tone: Tone is good throughout. ?Sensation: Sensation is intact to light touch touch throughout ?Coordination:  The patient has no dysdiadichokinesia or dysmetria. ?Motor: Strength is 5/5 in the bilateral upper and lower extremities.  Shoulder shrug is equal  bilaterally.  There is no pronator drift.  There are no fasciculations noted. ?Gait and Station: The patient is able to ambulate without difficulty.  ? ?MOVEMENT EXAM: ?Tremor:  There is no rest tremor.  There is mild postural tremor.  There is mild intention tremor.  Mild trouble with Archimedes spirals bilaterally, not much different than a year ago. ? I have reviewed and interpreted the following labs independently ?  Chemistry   ?   ?Component Value Date/Time  ? NA 139 08/31/2019 1631  ? K 4.3 08/31/2019 1631  ? CL 105 08/31/2019 1631  ? CO2 21 08/31/2019 1631  ? BUN 28 (H) 08/31/2019 1631  ? CREATININE 1.02 08/31/2019 1631  ?    ?Component Value Date/Time  ? CALCIUM 9.3 08/31/2019 1631  ?  ? ? ?Lab Results  ?Component Value Date  ? WBC 6.4 04/26/2013  ? HGB 14.6 04/26/2013  ? HCT 42.3 04/26/2013  ? MCV 90.2 04/26/2013  ? PLT 266 04/26/2013  ? ?Lab Results  ?Component Value Date  ? TSH 1.810 08/31/2019  ? ? ? ?CC:  Maury Dus, MD ? ? ?

## 2021-03-19 ENCOUNTER — Other Ambulatory Visit: Payer: Self-pay

## 2021-03-19 ENCOUNTER — Encounter: Payer: Self-pay | Admitting: Neurology

## 2021-03-19 ENCOUNTER — Ambulatory Visit: Payer: Medicare HMO | Admitting: Neurology

## 2021-03-19 VITALS — BP 122/82 | HR 62 | Ht 69.0 in | Wt 171.4 lb

## 2021-03-19 DIAGNOSIS — R251 Tremor, unspecified: Secondary | ICD-10-CM | POA: Diagnosis not present

## 2021-04-01 ENCOUNTER — Telehealth: Payer: Self-pay | Admitting: Psychiatry

## 2021-04-01 ENCOUNTER — Other Ambulatory Visit: Payer: Self-pay | Admitting: Urology

## 2021-04-01 ENCOUNTER — Other Ambulatory Visit (HOSPITAL_COMMUNITY): Payer: Self-pay

## 2021-04-01 NOTE — Telephone Encounter (Signed)
See message from wife. I called and spoke with patient and then his wife. Patient feels he is doing well. He talked about recently having a 70th birthday party and they are going to travel some. I wasn't sure he knew that his wife had called and was just asking general questions initially. He said the tremors are of some concern and that his neurologist said he is on 3 medications that could account for the tremors, but is hesitant to make any changes unless you recommend them. He says he is sleeping well, consistently getting 8 hours of sleep and is not tired during the day. He does say his wife has concerns that this is not the case. He said his baseline is being unmotivated, but he feels like he is better now and able to get projects done. He mentions you wanting him to take Concerta because it is a once a day dosing and he doesn't remember to take medications that have multiple doses.  His last RF was 11/24/20 for a 90-day supply, but he doesn't ask for a RF. Patient was very pleasant. He does not have rapid speech and did not seem to have racing thoughts.  ? 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 He has had inappropriate conversations, nothing of a sexual nature, but maybe has a financial conversation that is not appropriate in social situations. I mentioned the Concerta and wife says he manages his medications so she doesn't know if he is taking it. He has a pill box but isn't using it, gets meds out of the bottles the night before. She has concerns about not enough sleep and being up during the night.  ?

## 2021-04-01 NOTE — Telephone Encounter (Signed)
Brian Guerrero's wife called at 9:30 . She has concerns that Brian Guerrero is showing signs of going towards mania. He is staying up later and working on projects. Children have visited lately and have noticed changes with Brian Guerrero in there conversation with him. [name] has an appt 5/17 and he is on the cancellation list. However wife wants him to be seen sooner and wants to know what to do if he goes into mania before he is seen. Please give Brian Guerrero a call at [phone] ?

## 2021-04-02 ENCOUNTER — Other Ambulatory Visit: Payer: Self-pay

## 2021-04-02 DIAGNOSIS — F900 Attention-deficit hyperactivity disorder, predominantly inattentive type: Secondary | ICD-10-CM

## 2021-04-02 MED ORDER — METHYLPHENIDATE HCL ER (OSM) 36 MG PO TBCR: 36.0000 mg | EXTENDED_RELEASE_TABLET | Freq: Every day | ORAL | 0 refills | Status: DC

## 2021-04-02 NOTE — Telephone Encounter (Signed)
Recommendations given. Rx pended.  ?

## 2021-04-02 NOTE — Telephone Encounter (Signed)
The easiest that the safest thing to do is to stop the Wellbutrin.  The Concerta should be stimulating enough that he should not mist the Wellbutrin.  That may also help the tremor if he stops the Wellbutrin. ?

## 2021-04-03 ENCOUNTER — Other Ambulatory Visit (HOSPITAL_COMMUNITY): Payer: Self-pay

## 2021-04-07 ENCOUNTER — Other Ambulatory Visit (HOSPITAL_COMMUNITY): Payer: Self-pay

## 2021-04-07 MED ORDER — FINASTERIDE 5 MG PO TABS
ORAL_TABLET | ORAL | 3 refills | Status: DC
  Filled 2021-04-07: qty 90, 90d supply, fill #0
  Filled 2021-07-13: qty 90, 90d supply, fill #1

## 2021-04-08 ENCOUNTER — Other Ambulatory Visit (HOSPITAL_COMMUNITY): Payer: Self-pay

## 2021-04-08 DIAGNOSIS — R972 Elevated prostate specific antigen [PSA]: Secondary | ICD-10-CM | POA: Diagnosis not present

## 2021-04-08 DIAGNOSIS — E291 Testicular hypofunction: Secondary | ICD-10-CM | POA: Diagnosis not present

## 2021-04-08 DIAGNOSIS — N5201 Erectile dysfunction due to arterial insufficiency: Secondary | ICD-10-CM | POA: Diagnosis not present

## 2021-04-10 ENCOUNTER — Other Ambulatory Visit: Payer: Self-pay | Admitting: Urology

## 2021-04-10 DIAGNOSIS — R972 Elevated prostate specific antigen [PSA]: Secondary | ICD-10-CM

## 2021-04-14 ENCOUNTER — Ambulatory Visit: Payer: Medicare (Managed Care) | Admitting: Neurology

## 2021-04-14 DIAGNOSIS — R69 Illness, unspecified: Secondary | ICD-10-CM | POA: Diagnosis not present

## 2021-04-20 ENCOUNTER — Other Ambulatory Visit: Payer: Self-pay | Admitting: Psychiatry

## 2021-04-20 DIAGNOSIS — R69 Illness, unspecified: Secondary | ICD-10-CM | POA: Diagnosis not present

## 2021-04-20 DIAGNOSIS — F319 Bipolar disorder, unspecified: Secondary | ICD-10-CM

## 2021-04-21 MED ORDER — LITHIUM CARBONATE 300 MG PO CAPS
ORAL_CAPSULE | Freq: Two times a day (BID) | ORAL | 0 refills | Status: DC
  Filled 2021-04-21: qty 180, 90d supply, fill #0

## 2021-04-22 ENCOUNTER — Other Ambulatory Visit (HOSPITAL_COMMUNITY): Payer: Self-pay

## 2021-04-23 ENCOUNTER — Other Ambulatory Visit (HOSPITAL_COMMUNITY): Payer: Self-pay

## 2021-04-24 ENCOUNTER — Other Ambulatory Visit (HOSPITAL_COMMUNITY): Payer: Self-pay

## 2021-04-28 DIAGNOSIS — R69 Illness, unspecified: Secondary | ICD-10-CM | POA: Diagnosis not present

## 2021-04-29 ENCOUNTER — Ambulatory Visit (INDEPENDENT_AMBULATORY_CARE_PROVIDER_SITE_OTHER): Payer: Medicare HMO | Admitting: Psychiatry

## 2021-04-29 ENCOUNTER — Encounter: Payer: Self-pay | Admitting: Psychiatry

## 2021-04-29 DIAGNOSIS — F319 Bipolar disorder, unspecified: Secondary | ICD-10-CM

## 2021-04-29 DIAGNOSIS — F5105 Insomnia due to other mental disorder: Secondary | ICD-10-CM | POA: Diagnosis not present

## 2021-04-29 DIAGNOSIS — F900 Attention-deficit hyperactivity disorder, predominantly inattentive type: Secondary | ICD-10-CM | POA: Diagnosis not present

## 2021-04-29 DIAGNOSIS — G251 Drug-induced tremor: Secondary | ICD-10-CM | POA: Diagnosis not present

## 2021-04-29 DIAGNOSIS — F411 Generalized anxiety disorder: Secondary | ICD-10-CM | POA: Diagnosis not present

## 2021-04-29 DIAGNOSIS — R69 Illness, unspecified: Secondary | ICD-10-CM | POA: Diagnosis not present

## 2021-04-29 MED ORDER — METHYLPHENIDATE HCL ER (OSM) 27 MG PO TBCR: 27.0000 mg | EXTENDED_RELEASE_TABLET | Freq: Every day | ORAL | 0 refills | Status: DC

## 2021-04-29 NOTE — Progress Notes (Signed)
Brian Guerrero ?[id number] ?05/22/1950 ?71 y.o.  ? ? ? ?Subjective:  ? ?Patient ID:  Brian Guerrero is a 71 y.o. (DOB 1950-10-30) male. ? ?Chief Complaint:  ?Chief Complaint  ?Patient presents with  ? Follow-up  ? Manic Behavior  ? ADD  ? ? ?Depression ?       Associated symptoms include no decreased concentration and no suicidal ideas.  Past medical history includes anxiety.   ?Anxiety ?Patient reports no chest pain, confusion, decreased concentration, nervous/anxious behavior, palpitations, shortness of breath or suicidal ideas.  ? ?  ?Brian Guerrero presents to the office today for follow-up of med change. ? ?At visit  March 07, 2018.  For problems with attention and focus Concerta was increased from 27 mg a day to 30 mg a day. ? ?In January we reduced the Wellbutrin and added Concerta 27.  More stamina and more alert but still struggle with concentration and memory and tremor.  Can't say if it's new or a lifelong thing.  Tolerating the Concerta OK.  Thinks he could tolerate an increase in the Concerta.  Occ propranolol for tremor. ? ?seen Jun 02, 2018.  No meds were changed. He had received benefit from the increase Concerta given in March. ? ?seen 10/05/18.  Has stopped Concerta bc cost prohibitive.  Taking instead Ritalin and can't remember it TID.  Does get benefit but liked Concerta better.  Overall good and likes retirement.  Was too stressed at work before..  Tremors resolved.  Doing hobbies and travel. ?Brian Guerrero concerned about episode of mania she noticed in May.  Reduced sleep, faster driving, spending and talking faster.  Not more severe.  Lasted a few weeks and resolved after switch to Ritalin.  She doesn't see this now.  He didn't think it lasted more than a few days.  She thinks he didn't recognize it.  It was not severe. ?No med changes. ? ?05/16/19 appt noted.\ ?Doing well with mood.  Liked Concerta better and hard to take 3 daily of Ritalin.  Otherwise no med changes.  Cataract surgery  helped. Enjoyed retirements. ?Patient reports stable mood and denies depressed or irritable moods.  Patient denies any recent difficulty with anxiety. Rare anxiety.   Patient denies difficulty with sleep initiation or maintenance. Denies appetite disturbance but he's had 20# weight loss. He's getting less sugar in retirement and stays active.  Patient reports that energy and motivation have been good.  Patient denies any difficulty with concentration.  Patient denies any suicidal ideation.  Sleep 8 hours.  Not oversleeping. ?Retired at 71 yo. Feels good about it.  Enjoying it so far.  Working on new schedule.  Tackling some home projects. Motivation is better.  No depression.   ?Plan no med changes: ? ?02/26/2020 appointment with following noted: ?It is been a number of months since the last appointment.  Longer than requested by MD. ?Doing well and liking retirement. ?Motivation is still not great but Ritalin helps.   Tremor no worse. ?"As always, wanting to ask about cutting back on lithium and quetiapine bc under much less stress with retirement."  Didn't realize how much he was stressed. ?Quetiapine very effective for sleep and can't sleep if misses a dose. ?Admits he decreased quetiapine from 600 to 450 mg HS months ago. ?Patient reports stable mood and denies depressed or irritable moods.  Patient denies any recent difficulty with anxiety.  Patient denies difficulty with sleep initiation or maintenance. Denies appetite disturbance.  Patient reports that  energy and motivation have been good.  Patient denies any difficulty with concentration.  Patient denies any suicidal ideation. ?Plan: Continue lithium 600 mg daily and  ?OK to try reducing quetiapine to 300 mg nightly for bipolar depression. ?Continue Wellbutrin to 300 mg daily and  Ritalin 10 TID mg daily.   ? ?08/26/2020 appointment with the following noted: ?Seems to be working fine.  Rare sleep problems.  No SE with meds and no hangover. ?Overall good mood.   Brian Guerrero noticed 3 day period of hypomania and he tried to address by increasing sleep and it resolved.  Not totally clear of depressive sx but not debilitating and Ritalin clearly helps functions and  alertness. ?Still some tremor. ?1-2 cups coffee in AM. ?Plan: Continue meds except Wants to see again if Concerta could be affordable in place of Ritalin bc smoother and longer acting.  OK 36 mg  ?Watch vitamin D level ? ?12/23/2020 appointment with the following noted: ?Wants to try Concerta again instead of Ritalin 10 TID. ?Doing well otherwise.  Mood is ok.  Denies unusual mood swings but mild hypomania 4-5 days recenlty without being problematic. ?Had neuropsych testing. Memory stable.  Suggesting psychotherapy. ?Propranolol helps tremor.   ?Plan: Continue lithium 600 mg daily, quetiapine 300 mg nightly, Wellbutrin 300 mg daily and Ritalin 10 mg 3 times daily.  He is also taking propranolol as needed tremor ? ?04/01/2021 phone call.  Saw a neurologist about her tremors.  Wonders about stopping a medication that could be contributing to tremor.  Wife also voiced concerns about potential mild manic symptoms. ?Recommended that he stop the Wellbutrin which could contribute to both issues.  Call if symptoms worsen. ? ?04/29/2021 appointment with the following noted:  seen with wife Brian Guerrero ?Got temporarily supply of concerta but insurance won't pay for more. ?Liked Concerta bc more effective, but maybe a little too strong with jumpiness. ?Stopped Wellbutrin about 4 weeks ago.  Didn't notice much change. ?Tremors are much improved overall. ?He thinks his mood is improved and pretty stable. ?Excitable.not depressed.  Sleep excellent with quetiapine. ?Admits being more hyper.  More aggressive out of character and others have noticed.  Increased sexual interest. ?Family concerned about his memory and has seen neurologist.  Wife concerned about his posture.  ?Wife says kids noticed a little aggressiveness and some mild manic sx  with less need for sleep. She noticed for several mos.  Wife sees less aggression off the Wellbutrin ?DX [facility] per neuropsych testing. ? ?Past Psychiatric Medication Trials: Concerta 36,  Ritalin 10 TID ?Propranolol 20 AM ?Lithium ?Seroquel ?Wellbutrin 300 ?Ritalin 10 mg TID ? ?Review of Systems:  ?Review of Systems  ?Respiratory:  Negative for shortness of breath.   ?Cardiovascular:  Negative for chest pain and palpitations.  ?Neurological:  Positive for tremors. Negative for weakness.  ?     No falls  ?Psychiatric/Behavioral:  Negative for agitation, behavioral problems, confusion, decreased concentration, dysphoric mood, hallucinations, self-injury, sleep disturbance and suicidal ideas. The patient is hyperactive. The patient is not nervous/anxious.  tremor variable worse with caffeine ? ?Medications: I have reviewed the patient's current medications. ? ?Current Outpatient Medications  ?Medication Sig Dispense Refill  ? Acetylcysteine (NAC 600 PO) Take 1,200 mg by mouth daily.    ? amoxicillin (AMOXIL) 250 MG capsule Take 1 capsule by mouth 4 times a day 40 capsule 1  ? aspirin EC 81 MG tablet Take 81 mg by mouth daily.    ? Carnitine-B5-B6 (L-CARNITINE 500 PO) Take  1,000 mg by mouth daily.    ? finasteride (PROSCAR) 5 MG tablet Take 1 tablet by mouth daily 90 tablet 3  ? lithium carbonate 300 MG capsule Take1 capsule by mouth 2 (two) times daily. 180 capsule 0  ? methylphenidate (CONCERTA) 36 MG PO CR tablet Take 1 tablet (36 mg total) by mouth daily. 30 tablet 0  ? methylphenidate (CONCERTA) 36 MG PO CR tablet Take 1 tablet (36 mg total) by mouth daily. 30 tablet 0  ? methylphenidate (RITALIN) 10 MG tablet TAKE 1 TABLET BY MOUTH 3 TIMES DAILY WITH A MEAL 270 tablet 0  ? propranolol (INDERAL) 10 MG tablet Take 2-4 tablets by mouth twice daily as needed for tremor 540 tablet 0  ? Pyridoxine HCl (VITAMIN B6) 100 MG TABS Take 100 mg by mouth daily.    ? QUEtiapine (SEROQUEL) 300 MG tablet TAKE 1 TABLET BY MOUTH AT  BEDTIME 90 tablet 1  ? buPROPion (WELLBUTRIN XL) 300 MG 24 hr tablet Take 1 tablet (300 mg total) by mouth daily. (Patient not taking: Reported on 04/29/2021) 90 tablet 0  ? sildenafil (VIAGRA) 100 MG tablet

## 2021-05-06 ENCOUNTER — Other Ambulatory Visit (HOSPITAL_COMMUNITY): Payer: Self-pay

## 2021-05-06 ENCOUNTER — Telehealth: Payer: Self-pay | Admitting: Psychiatry

## 2021-05-06 NOTE — Telephone Encounter (Signed)
Pt cannot afford the new CR Concerta. Over $100 per copay. Can he please switch back to the old dose. Send to: ?[facility] [id number] - [address]  ?[place], [place] [address]  ?

## 2021-05-07 NOTE — Telephone Encounter (Signed)
He can get the Concerta for $41 at [facility] or [facility].  Is not worth doing that using good Rx?  If not I will switch him back to the other short acting version. ?

## 2021-05-11 NOTE — Telephone Encounter (Signed)
Patient wants 27 mg and not 36. [facility] on [facility] does not have enough to fill request.  ?

## 2021-05-12 ENCOUNTER — Ambulatory Visit
Admission: RE | Admit: 2021-05-12 | Discharge: 2021-05-12 | Disposition: A | Discharge status: Home / Self Care | Payer: Medicare HMO | Source: Ambulatory Visit | Attending: Urology | Admitting: Urology

## 2021-05-12 DIAGNOSIS — R590 Localized enlarged lymph nodes: Secondary | ICD-10-CM | POA: Diagnosis not present

## 2021-05-12 DIAGNOSIS — R69 Illness, unspecified: Secondary | ICD-10-CM | POA: Diagnosis not present

## 2021-05-12 DIAGNOSIS — N400 Enlarged prostate without lower urinary tract symptoms: Secondary | ICD-10-CM | POA: Diagnosis not present

## 2021-05-12 DIAGNOSIS — R972 Elevated prostate specific antigen [PSA]: Secondary | ICD-10-CM

## 2021-05-12 MED ORDER — GADOBENATE DIMEGLUMINE 529 MG/ML IV SOLN
15.0000 mL | Freq: Once | INTRAVENOUS | Status: AC | PRN
Start: 2021-05-12 — End: 2021-05-12
  Administered 2021-05-12: 15 mL via INTRAVENOUS

## 2021-05-14 ENCOUNTER — Other Ambulatory Visit: Payer: Self-pay

## 2021-05-14 DIAGNOSIS — F319 Bipolar disorder, unspecified: Secondary | ICD-10-CM

## 2021-05-14 DIAGNOSIS — F900 Attention-deficit hyperactivity disorder, predominantly inattentive type: Secondary | ICD-10-CM

## 2021-05-14 MED ORDER — METHYLPHENIDATE HCL ER (OSM) 27 MG PO TBCR: 27.0000 mg | EXTENDED_RELEASE_TABLET | Freq: Every day | ORAL | 0 refills | Status: DC

## 2021-05-14 NOTE — Telephone Encounter (Signed)
Patient has had difficulty finding 27 mg methylphenidate.  He will call around to see if he can find it.  Due to cost he wants to use either [facility] or [facility]. He will let us know.  ?

## 2021-05-20 ENCOUNTER — Ambulatory Visit: Payer: Medicare (Managed Care) | Admitting: Psychiatry

## 2021-06-23 ENCOUNTER — Ambulatory Visit: Payer: Medicare (Managed Care) | Admitting: Psychiatry

## 2021-07-13 ENCOUNTER — Other Ambulatory Visit (HOSPITAL_COMMUNITY): Payer: Self-pay

## 2021-07-13 ENCOUNTER — Telehealth: Payer: Self-pay | Admitting: Psychiatry

## 2021-07-13 ENCOUNTER — Other Ambulatory Visit: Payer: Self-pay | Admitting: Psychiatry

## 2021-07-13 DIAGNOSIS — F900 Attention-deficit hyperactivity disorder, predominantly inattentive type: Secondary | ICD-10-CM

## 2021-07-13 DIAGNOSIS — F319 Bipolar disorder, unspecified: Secondary | ICD-10-CM

## 2021-07-13 MED ORDER — METHYLPHENIDATE HCL ER (OSM) 27 MG PO TBCR
27.0000 mg | EXTENDED_RELEASE_TABLET | Freq: Every day | ORAL | 0 refills | Status: DC
  Filled 2021-07-13: qty 90, 90d supply, fill #0

## 2021-07-13 NOTE — Telephone Encounter (Signed)
I sent RX 

## 2021-07-13 NOTE — Telephone Encounter (Signed)
Please review,should I pend the remainder or new rx?

## 2021-07-13 NOTE — Telephone Encounter (Signed)
Pt called @ 2:38 pm stating that when his concerta was sent in to the pharmacy they only had 60 pills of the 90 that was written. So pt needs a new script sent to the [facility] on [address]

## 2021-07-14 ENCOUNTER — Other Ambulatory Visit: Payer: Self-pay | Admitting: Psychiatry

## 2021-07-14 DIAGNOSIS — F319 Bipolar disorder, unspecified: Secondary | ICD-10-CM

## 2021-07-15 ENCOUNTER — Other Ambulatory Visit (HOSPITAL_COMMUNITY): Payer: Self-pay

## 2021-07-15 MED ORDER — QUETIAPINE FUMARATE 300 MG PO TABS
ORAL_TABLET | Freq: Every day | ORAL | 0 refills | Status: DC
  Filled 2021-07-15: qty 30, 30d supply, fill #0

## 2021-07-16 ENCOUNTER — Other Ambulatory Visit (HOSPITAL_COMMUNITY): Payer: Self-pay

## 2021-07-16 ENCOUNTER — Other Ambulatory Visit: Payer: Self-pay | Admitting: Psychiatry

## 2021-07-16 DIAGNOSIS — F319 Bipolar disorder, unspecified: Secondary | ICD-10-CM

## 2021-07-16 MED ORDER — LITHIUM CARBONATE 300 MG PO CAPS
ORAL_CAPSULE | Freq: Two times a day (BID) | ORAL | 0 refills | Status: DC
  Filled 2021-07-16: qty 180, 90d supply, fill #0

## 2021-07-17 ENCOUNTER — Other Ambulatory Visit (HOSPITAL_COMMUNITY): Payer: Self-pay

## 2021-07-27 ENCOUNTER — Telehealth: Payer: Self-pay

## 2021-07-27 ENCOUNTER — Telehealth: Payer: Self-pay | Admitting: Psychiatry

## 2021-07-27 ENCOUNTER — Other Ambulatory Visit (HOSPITAL_COMMUNITY): Payer: Self-pay

## 2021-07-27 DIAGNOSIS — F319 Bipolar disorder, unspecified: Secondary | ICD-10-CM

## 2021-07-27 DIAGNOSIS — F900 Attention-deficit hyperactivity disorder, predominantly inattentive type: Secondary | ICD-10-CM

## 2021-07-27 NOTE — Telephone Encounter (Signed)
Brian Guerrero called this morning at 10:50 to request refill of his Seroquel and Concerta.  He has appt tomorrow 7/25, but needs to be able to pick them up today or early tomorrow because he is leaving town as soon as his appt is over.  He did request that the Concerta be written for just #60 because that is what the pharmacy has.  [name] [address]

## 2021-07-27 NOTE — Telephone Encounter (Signed)
Rx sent to [facility],will call to cancel

## 2021-07-28 ENCOUNTER — Encounter: Payer: Self-pay | Admitting: Psychiatry

## 2021-07-28 ENCOUNTER — Ambulatory Visit (INDEPENDENT_AMBULATORY_CARE_PROVIDER_SITE_OTHER): Payer: Medicare HMO | Admitting: Psychiatry

## 2021-07-28 VITALS — BP 127/78 | HR 59

## 2021-07-28 DIAGNOSIS — G251 Drug-induced tremor: Secondary | ICD-10-CM

## 2021-07-28 DIAGNOSIS — F319 Bipolar disorder, unspecified: Secondary | ICD-10-CM | POA: Diagnosis not present

## 2021-07-28 DIAGNOSIS — F5105 Insomnia due to other mental disorder: Secondary | ICD-10-CM

## 2021-07-28 DIAGNOSIS — Z79899 Other long term (current) drug therapy: Secondary | ICD-10-CM

## 2021-07-28 DIAGNOSIS — F900 Attention-deficit hyperactivity disorder, predominantly inattentive type: Secondary | ICD-10-CM | POA: Diagnosis not present

## 2021-07-28 DIAGNOSIS — R7989 Other specified abnormal findings of blood chemistry: Secondary | ICD-10-CM

## 2021-07-28 DIAGNOSIS — F411 Generalized anxiety disorder: Secondary | ICD-10-CM

## 2021-07-28 DIAGNOSIS — R69 Illness, unspecified: Secondary | ICD-10-CM | POA: Diagnosis not present

## 2021-07-28 MED ORDER — METHYLPHENIDATE HCL ER (OSM) 27 MG PO TBCR: 27.0000 mg | EXTENDED_RELEASE_TABLET | ORAL | 0 refills | Status: DC

## 2021-07-28 NOTE — Progress Notes (Signed)
Brian Guerrero [id number] November 14, 1950 71 y.o.     Subjective:   Patient ID:  Brian Guerrero is a 71 y.o. (DOB [birth year]) male.  Chief Complaint:  Chief Complaint  Patient presents with   Follow-up    Bipolar I disorder ([place])   ADHD   Anxiety    Depression        Associated symptoms include no decreased concentration and no suicidal ideas.  Past medical history includes anxiety.   Anxiety Patient reports no chest pain, confusion, decreased concentration, nervous/anxious behavior, palpitations, shortness of breath or suicidal ideas.      Brian Guerrero presents to the office today for follow-up of med change.  At visit  March 07, 2018.  For problems with attention and focus Concerta was increased from 27 mg a day to 30 mg a day.  In January we reduced the Wellbutrin and added Concerta 27.  More stamina and more alert but still struggle with concentration and memory and tremor.  Can't say if it's new or a lifelong thing.  Tolerating the Concerta OK.  Thinks he could tolerate an increase in the Concerta.  Occ propranolol for tremor.  seen Jun 02, 2018.  No meds were changed. He had received benefit from the increase Concerta given in March.  seen 10/05/18.  Has stopped Concerta bc cost prohibitive.  Taking instead Ritalin and can't remember it TID.  Does get benefit but liked Concerta better.  Overall good and likes retirement.  Was too stressed at work before..  Tremors resolved.  Doing hobbies and travel. Brian Guerrero concerned about episode of mania she noticed in May.  Reduced sleep, faster driving, spending and talking faster.  Not more severe.  Lasted a few weeks and resolved after switch to Ritalin.  She doesn't see this now.  He didn't think it lasted more than a few days.  She thinks he didn't recognize it.  It was not severe. No med changes.  05/16/19 appt noted.\ Doing well with mood.  Liked Concerta better and hard to take 3 daily of Ritalin.  Otherwise no med  changes.  Cataract surgery helped. Enjoyed retirements. Patient reports stable mood and denies depressed or irritable moods.  Patient denies any recent difficulty with anxiety. Rare anxiety.   Patient denies difficulty with sleep initiation or maintenance. Denies appetite disturbance but he's had 20# weight loss. He's getting less sugar in retirement and stays active.  Patient reports that energy and motivation have been good.  Patient denies any difficulty with concentration.  Patient denies any suicidal ideation.  Sleep 8 hours.  Not oversleeping. Retired at 71 yo. Feels good about it.  Enjoying it so far.  Working on new schedule.  Tackling some home projects. Motivation is better.  No depression.   Plan no med changes:  02/26/2020 appointment with following noted: It is been a number of months since the last appointment.  Longer than requested by MD. Doing well and liking retirement. Motivation is still not great but Ritalin helps.   Tremor no worse. "As always, wanting to ask about cutting back on lithium and quetiapine bc under much less stress with retirement."  Didn't realize how much he was stressed. Quetiapine very effective for sleep and can't sleep if misses a dose. Admits he decreased quetiapine from 600 to 450 mg HS months ago. Patient reports stable mood and denies depressed or irritable moods.  Patient denies any recent difficulty with anxiety.  Patient denies difficulty with sleep initiation or maintenance.  Denies appetite disturbance.  Patient reports that energy and motivation have been good.  Patient denies any difficulty with concentration.  Patient denies any suicidal ideation. Plan: Continue lithium 600 mg daily and  OK to try reducing quetiapine to 300 mg nightly for bipolar depression. Continue Wellbutrin to 300 mg daily and  Ritalin 10 TID mg daily.    08/26/2020 appointment with the following noted: Seems to be working fine.  Rare sleep problems.  No SE with meds and no  hangover. Overall good mood.  Brian Guerrero noticed 3 day period of hypomania and he tried to address by increasing sleep and it resolved.  Not totally clear of depressive sx but not debilitating and Ritalin clearly helps functions and  alertness. Still some tremor. 1-2 cups coffee in AM. Plan: Continue meds except Wants to see again if Concerta could be affordable in place of Ritalin bc smoother and longer acting.  OK 36 mg  Watch vitamin D level  12/23/2020 appointment with the following noted: Wants to try Concerta again instead of Ritalin 10 TID. Doing well otherwise.  Mood is ok.  Denies unusual mood swings but mild hypomania 4-5 days recenlty without being problematic. Had neuropsych testing. Memory stable.  Suggesting psychotherapy. Propranolol helps tremor.   Plan: Continue lithium 600 mg daily, quetiapine 300 mg nightly, Wellbutrin 300 mg daily and Ritalin 10 mg 3 times daily.  He is also taking propranolol as needed tremor  04/01/2021 phone call.  Saw a neurologist about her tremors.  Wonders about stopping a medication that could be contributing to tremor.  Wife also voiced concerns about potential mild manic symptoms. Recommended that he stop the Wellbutrin which could contribute to both issues.  Call if symptoms worsen.  04/29/2021 appointment with the following noted:  seen with wife Brian Guerrero Got temporarily supply of concerta but insurance won't pay for more. Liked Concerta bc more effective, but maybe a little too strong with jumpiness. Stopped Wellbutrin about 4 weeks ago.  Didn't notice much change. Tremors are much improved overall. He thinks his mood is improved and pretty stable. Excitable.not depressed.  Sleep excellent with quetiapine. Admits being more hyper.  More aggressive out of character and others have noticed.  Increased sexual interest. Family concerned about his memory and has seen neurologist.  Wife concerned about his posture.  Wife says kids noticed a little  aggressiveness and some mild manic sx with less need for sleep. She noticed for several mos.  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 Plan: DC Wellbutrin and continue Ritalin 10 TID mg daily or Concerta 27 mg AM.    2/77/4128 appointment with the following noted: Remains on lithium 300 mg twice daily, Concerta 27 or 36 mg daily versus Ritalin 10 mg 3 times daily depending on availability, propranolol 20 to 40 mg twice daily as needed tremor, quetiapine 300 mg nightly Most recent Concerta 27 mg dose was #60 instead of #90.  He feels he's gotten more tolerant to Concerta and no SE Sees benefits with stimulants for task completion and more willing to do things and less fear of mistakes and more energy. Denies manic sx lately and thinks Brian Guerrero would agree. Sleep very well.   Tremors much improved but still affects fine motor.  Not depressed.    Anxiety usually ok. Fixing up second home in [place] ready for year round and doesn't like to call people which causes anxiety. Can be edgy driving with wife bc they are different.  Past Psychiatric Medication Trials: Propranolol 20 AM Lithium 600 mg max dose tolerated DT tremor Seroquel Wellbutrin 300 Ritalin 10 mg TID Concerta 36 too much  Review of Systems:  Review of Systems  Respiratory:  Negative for shortness of breath.   Cardiovascular:  Negative for chest pain and palpitations.  Neurological:  Negative for tremors and weakness.       No falls  Psychiatric/Behavioral:  Negative for agitation, behavioral problems, confusion, decreased concentration, dysphoric mood, hallucinations, self-injury, sleep disturbance and suicidal ideas. The patient is hyperactive. The patient is not nervous/anxious.   tremor variable worse with caffeine  Medications: I have reviewed the patient's current medications.  Current Outpatient Medications  Medication Sig Dispense Refill   Acetylcysteine (NAC 600 PO) Take 1,200 mg  by mouth daily.     aspirin EC 81 MG tablet Take 81 mg by mouth daily.     Carnitine-B5-B6 (L-CARNITINE 500 PO) Take 1,000 mg by mouth daily.     finasteride (PROSCAR) 5 MG tablet Take 1 tablet by mouth daily 90 tablet 3   lithium carbonate 300 MG capsule Take 1 capsule by mouth 2 (two) times daily. 180 capsule 0   methylphenidate 36 MG PO CR tablet Take 36 mg by mouth daily.     propranolol (INDERAL) 10 MG tablet Take 2-4 tablets by mouth twice daily as needed for tremor 540 tablet 0   Pyridoxine HCl (VITAMIN B6) 100 MG TABS Take 100 mg by mouth daily.     QUEtiapine (SEROQUEL) 300 MG tablet TAKE 1 TABLET BY MOUTH AT BEDTIME 30 tablet 0   sildenafil (VIAGRA) 100 MG tablet TAKE 1 TABLET BY MOUTH AS NEEDED AS DIRECTED (Patient not taking: Reported on 12/23/2020) 30 tablet 11   No current facility-administered medications for this visit.    Medication Side Effect:  CO sluggish in the morning. Tremor returned.  Allergies:  Allergies  Allergen Reactions   Lincomycin Hcl     As a child/had a hive    Past Medical History:  Diagnosis Date   Attention or concentration deficit    Bipolar disorder    BPH (benign prostatic hyperplasia)    Diverticular disease    [facility]/per pt not aware of this   Lithium-induced tremor    Varicose veins    Wears glasses     Family History  Problem Relation Age of Onset   Depression Mother    Prostate cancer Father    Kidney disease Father    Dementia Father        unspecified type; sympton onset in late 80s/early 41s   Tremor Father    Parkinson's disease Sister     Social History   Socioeconomic History   Marital status: Married    Spouse name: Not on file   Number of children: Not on file   Years of education: 17   Highest education level: Bachelor's degree (e.g., BA, AB, BS)  Occupational History   Not on file  Tobacco Use   Smoking status: Never   Smokeless tobacco: Never  Vaping Use   Vaping Use: Never used  Substance and  Sexual Activity   Alcohol use: Not Currently    Alcohol/week: 7.0 standard drinks of alcohol    Types: 7 Cans of beer per week    Comment: 1 beer nightly   Drug use: Not Currently   Sexual activity: Not on file  Other Topics Concern   Not on file  Social History Narrative   Not on  file   Social Determinants of Health   Financial Resource Strain: Not on file  Food Insecurity: Not on file  Transportation Needs: Not on file  Physical Activity: Not on file  Stress: Not on file  Social Connections: Not on file  Intimate Partner Violence: Not on file    Past Medical History, Surgical history, Social history, and Family history were reviewed and updated as appropriate.   Please see review of systems for further details on the patient's review from today.   Objective:   Physical Exam:  BP 127/78   Pulse (!) 59   Physical Exam Constitutional:      General: He is not in acute distress. Musculoskeletal:        General: No deformity.  Neurological:     Mental Status: He is alert and oriented to person, place, and time.     Cranial Nerves: No dysarthria.     Motor: Tremor present.     Coordination: Coordination normal.  Psychiatric:        Attention and Perception: Attention and perception normal. He does not perceive auditory or visual hallucinations.        Mood and Affect: Mood normal. Mood is not anxious or depressed. Affect is not labile, blunt, angry or inappropriate.        Speech: Speech normal. Speech is not rapid and pressured.        Behavior: Behavior normal. Behavior is not slowed. Behavior is cooperative.        Thought Content: Thought content normal. Thought content is not paranoid or delusional. Thought content does not include homicidal or suicidal ideation. Thought content does not include suicidal plan.        Cognition and Memory: Cognition and memory normal.        Judgment: Judgment normal.     Comments: Insight fair.  More upbeat than usual     Lab  Review:     Component Value Date/Time   NA 139 08/31/2019 1631   K 4.3 08/31/2019 1631   CL 105 08/31/2019 1631   CO2 21 08/31/2019 1631   GLUCOSE 93 08/31/2019 1631   GLUCOSE 93 04/26/2013 0930   BUN 28 (H) 08/31/2019 1631   CREATININE 1.02 08/31/2019 1631   CALCIUM 9.3 08/31/2019 1631   GFRNONAA 75 08/31/2019 1631   GFRAA 86 08/31/2019 1631       Component Value Date/Time   WBC 6.4 04/26/2013 0930   RBC 4.69 04/26/2013 0930   HGB 14.6 04/26/2013 0930   HCT 42.3 04/26/2013 0930   PLT 266 04/26/2013 0930   MCV 90.2 04/26/2013 0930   MCH 31.1 04/26/2013 0930   MCHC 34.5 04/26/2013 0930   RDW 13.4 04/26/2013 0930   LYMPHSABS 1.2 04/26/2013 0930   MONOABS 0.7 04/26/2013 0930   EOSABS 0.3 04/26/2013 0930   BASOSABS 0.0 04/26/2013 0930    Lithium Lvl  Date Value Ref Range Status  08/29/2020 0.4 (L) 0.6 - 1.2 mmol/L Final   08/31/19 level at 430PM on dose 600 HS  No results found for: "PHENYTOIN", "PHENOBARB", "VALPROATE", "CBMZ"   .res Assessment: Plan:    Bipolar I disorder ([place])  Attention deficit hyperactivity disorder (ADHD), predominantly inattentive type  Generalized anxiety disorder  Insomnia due to mental condition  Low vitamin D level  Lithium-induced tremor  Lithium use   I believe that his problems with concentration are related to a type of acquired ADD due to the consequence of chronic bipolar depression with inadequate response  to typical bipolar disorder medications as well as Wellbutrin.  Since this concentration problem was serious enough to be affecting his job function and potentially forcing retirement I offered him the option of treating it with stimulants.  He agreed and has obtained substantial benefit from the Concerta especially after increasing from 27 mg up to 36 mg daily.  He has retired now but still needs the stimulant to help with concentration and focus, motivation and productivity at home. option again if Concerta could be  affordable in place of Ritalin bc smoother and longer acting.  OK 36 mg  Disc importance of staying active to manage risk of depression.    Discussed the neurology and neuropsych testing with the patient and his wife.  It is unlikely that psych meds are causing cognitive impairment.  It is possible that they have been causing or contributing to tremor.  We will stop the Wellbutrin in an effort to minimize that effect and hope he does not relapse into depression.  We will reduce the Concerta to 27 mg daily to minimize tremor and reduce manic risk.  Discussed potential benefits, risks, and side effects of stimulants with patient to include increased heart rate, palpitations, insomnia, increased anxiety, increased irritability, or decreased appetite.  Instructed patient to contact office if experiencing any significant tolerability issues.    DC Wellbutrin and continue Ritalin 10 TID mg daily or Concerta 27 mg AM.   Answered questions about the differences  Tremor related to medications have recurred and he saw neurologist and disc that and neuropsych testing. Propranolol for tremor increase to 20- 40 mg BID prn .  He reports it helps the tremor. Disc SE in detail.  Continue lithium 600 mg daily and  Cont quetiapine to 300 mg nightly for bipolar depression. Reduction does have some risk of mania or depression.  Discussed potential metabolic side effects associated with atypical antipsychotics, as well as potential risk for movement side effects. Advised pt to contact office if movement side effects occur.   Counseled patient regarding potential benefits, risks, and side effects of lithium to include potential risk of lithium affecting thyroid and renal function.  Discussed need for periodic lab monitoring to determine drug level and to assess for potential adverse effects.  Counseled patient regarding signs and symptoms of lithium toxicity and advised that they notify office immediately or seek  urgent medical attention if experiencing these signs and symptoms.  Patient advised to contact office with any questions or concerns.  Recommend consistent use of propranolol before work to help with the tremor.  He has not done that but it is his choice. Saw Dr. Carles Collet for tremor and she also suggested neuropsych testing for memory concerns. Disc results from neuropsych testing and the suggestion about counseling.  Check lithium level  His vitamin D level was 36 in past and needs to be repeated and consideration could be given to increasing vitamin D. It was drawn late 2022= 38 5000 units through winter and then drop back to 2000 units daily  FU 3 mos  Lynder Parents, MD, DFAPA   No future appointments.   No orders of the defined types were placed in this encounter.     -------------------------------

## 2021-07-28 NOTE — Patient Instructions (Signed)
Check B6 dose.  500 mg once or twice daily works better than lower doses for tremor.

## 2021-08-12 DIAGNOSIS — Z8249 Family history of ischemic heart disease and other diseases of the circulatory system: Secondary | ICD-10-CM | POA: Diagnosis not present

## 2021-08-12 DIAGNOSIS — N529 Male erectile dysfunction, unspecified: Secondary | ICD-10-CM | POA: Diagnosis not present

## 2021-08-12 DIAGNOSIS — Z818 Family history of other mental and behavioral disorders: Secondary | ICD-10-CM | POA: Diagnosis not present

## 2021-08-12 DIAGNOSIS — R69 Illness, unspecified: Secondary | ICD-10-CM | POA: Diagnosis not present

## 2021-08-12 DIAGNOSIS — N400 Enlarged prostate without lower urinary tract symptoms: Secondary | ICD-10-CM | POA: Diagnosis not present

## 2021-08-12 DIAGNOSIS — Z7982 Long term (current) use of aspirin: Secondary | ICD-10-CM | POA: Diagnosis not present

## 2021-08-12 DIAGNOSIS — F902 Attention-deficit hyperactivity disorder, combined type: Secondary | ICD-10-CM | POA: Diagnosis not present

## 2021-08-12 DIAGNOSIS — Z809 Family history of malignant neoplasm, unspecified: Secondary | ICD-10-CM | POA: Diagnosis not present

## 2021-08-12 DIAGNOSIS — R251 Tremor, unspecified: Secondary | ICD-10-CM | POA: Diagnosis not present

## 2021-08-14 ENCOUNTER — Telehealth: Payer: Self-pay | Admitting: Psychiatry

## 2021-08-14 ENCOUNTER — Other Ambulatory Visit: Payer: Self-pay

## 2021-08-14 DIAGNOSIS — F319 Bipolar disorder, unspecified: Secondary | ICD-10-CM

## 2021-08-14 MED ORDER — QUETIAPINE FUMARATE 300 MG PO TABS: ORAL_TABLET | Freq: Every day | ORAL | 1 refills | Status: DC

## 2021-08-14 NOTE — Telephone Encounter (Signed)
Rx sent 

## 2021-08-14 NOTE — Telephone Encounter (Signed)
Pt called requesting RF for Quetiapine 300 mg tablet take 1 tablet by mouth at bedtime #30 [facility] [place] location on [address].  Apt 10/25

## 2021-08-18 DIAGNOSIS — G250 Essential tremor: Secondary | ICD-10-CM | POA: Diagnosis not present

## 2021-08-18 DIAGNOSIS — R69 Illness, unspecified: Secondary | ICD-10-CM | POA: Diagnosis not present

## 2021-08-18 DIAGNOSIS — Z0000 Encounter for general adult medical examination without abnormal findings: Secondary | ICD-10-CM | POA: Diagnosis not present

## 2021-08-18 DIAGNOSIS — R972 Elevated prostate specific antigen [PSA]: Secondary | ICD-10-CM | POA: Diagnosis not present

## 2021-08-18 DIAGNOSIS — N400 Enlarged prostate without lower urinary tract symptoms: Secondary | ICD-10-CM | POA: Diagnosis not present

## 2021-08-18 DIAGNOSIS — B351 Tinea unguium: Secondary | ICD-10-CM | POA: Diagnosis not present

## 2021-08-24 ENCOUNTER — Other Ambulatory Visit: Payer: Self-pay

## 2021-08-24 DIAGNOSIS — F900 Attention-deficit hyperactivity disorder, predominantly inattentive type: Secondary | ICD-10-CM

## 2021-08-24 NOTE — Telephone Encounter (Signed)
Pt called requesting RF METHYLPHENIDATE 27 mg @ [facility] [address]. Will be going out of town tomorrow. Apt 10/25 Pt # [phone]

## 2021-08-25 ENCOUNTER — Other Ambulatory Visit: Payer: Self-pay

## 2021-08-25 MED ORDER — METHYLPHENIDATE HCL ER (OSM) 27 MG PO TBCR: 27.0000 mg | EXTENDED_RELEASE_TABLET | ORAL | 0 refills | Status: DC

## 2021-08-25 NOTE — Telephone Encounter (Signed)
Brian Guerrero called this morning at 9:15 to check status of refill of his Concerta.  He said he called yesterday to request the fill.  Appt 10/25.  He is going out of town this afternoon and needed to pick it up before he left.  He knows it is a few days early.  Last fill he said was 7/25.  He is asking to be able to pick it up today since he is going out of town.  Other issue is there is a request from the [facility] on [facility], but that is not where is should go.  He only used them one time due to shortage.  It should go to [facility] on [address].  Please be sure to send to correct pharmacy.

## 2021-09-14 ENCOUNTER — Other Ambulatory Visit: Payer: Self-pay

## 2021-09-14 ENCOUNTER — Telehealth: Payer: Self-pay | Admitting: Psychiatry

## 2021-09-14 DIAGNOSIS — F319 Bipolar disorder, unspecified: Secondary | ICD-10-CM

## 2021-09-14 MED ORDER — QUETIAPINE FUMARATE 300 MG PO TABS: ORAL_TABLET | Freq: Every day | ORAL | 0 refills | Status: DC

## 2021-09-14 NOTE — Telephone Encounter (Signed)
Pt called at 11:27a.  He needs his [facility]apine [facility] to [facility] on [address] in [place], [place].  Phone [phone].  Next appt 10/25

## 2021-09-14 NOTE — Telephone Encounter (Signed)
Rx sent 

## 2021-09-28 ENCOUNTER — Telehealth: Payer: Self-pay | Admitting: Psychiatry

## 2021-09-28 ENCOUNTER — Telehealth: Payer: Self-pay

## 2021-09-28 DIAGNOSIS — F900 Attention-deficit hyperactivity disorder, predominantly inattentive type: Secondary | ICD-10-CM

## 2021-09-28 MED ORDER — METHYLPHENIDATE HCL ER (OSM) 27 MG PO TBCR: 27.0000 mg | EXTENDED_RELEASE_TABLET | ORAL | 0 refills | Status: DC

## 2021-09-28 NOTE — Telephone Encounter (Signed)
Pt called requesting RF Methylphenidate [facility] to [facility] [address] [place] [address]. Apt 10/25

## 2021-09-28 NOTE — Telephone Encounter (Signed)
Pended.

## 2021-10-06 ENCOUNTER — Telehealth: Payer: Self-pay

## 2021-10-06 DIAGNOSIS — F900 Attention-deficit hyperactivity disorder, predominantly inattentive type: Secondary | ICD-10-CM

## 2021-10-06 DIAGNOSIS — F319 Bipolar disorder, unspecified: Secondary | ICD-10-CM

## 2021-10-06 MED ORDER — METHYLPHENIDATE HCL ER (OSM) 27 MG PO TBCR: 27.0000 mg | EXTENDED_RELEASE_TABLET | ORAL | 0 refills | Status: DC

## 2021-10-06 NOTE — Telephone Encounter (Signed)
Pt LVM @ 2:43p.  He wanted to r/s his appt because he is out of town until Dec. Dr Clovis Pu first available is not until Jan. [facility], so he would like the Methylphenidate sent to   [facility] [address] [place], [place] [address]  [phone]  Next appt 1/3

## 2021-10-06 NOTE — Telephone Encounter (Signed)
CANCELLED AND PENDED

## 2021-10-07 ENCOUNTER — Other Ambulatory Visit: Payer: Self-pay | Admitting: Psychiatry

## 2021-10-07 DIAGNOSIS — F319 Bipolar disorder, unspecified: Secondary | ICD-10-CM

## 2021-10-14 ENCOUNTER — Other Ambulatory Visit: Payer: Self-pay

## 2021-10-14 DIAGNOSIS — F900 Attention-deficit hyperactivity disorder, predominantly inattentive type: Secondary | ICD-10-CM

## 2021-10-14 MED ORDER — METHYLPHENIDATE HCL ER (OSM) 27 MG PO TBCR: 27.0000 mg | EXTENDED_RELEASE_TABLET | ORAL | 0 refills | Status: DC

## 2021-10-14 MED ORDER — LITHIUM CARBONATE 300 MG PO CAPS: ORAL_CAPSULE | Freq: Two times a day (BID) | ORAL | 0 refills | Status: DC

## 2021-10-14 NOTE — Telephone Encounter (Signed)
Pt called at 1:26p.  He said the pharmacy told him they cancelled the Methylphenidate '27mg'$  script because he didn't get there in time.  He needs that sent back in.  He wants the refill for Lithium Carbonate sent in as well.    [facility] [address] [place] [address] [facility]  [address] [place], [place] [address]  Phone:  [phone]  Fax:  [phone]   Next appt 1/3

## 2021-10-14 NOTE — Telephone Encounter (Signed)
Lithium Rx sent. Pended methylphenidate to Dr. Clovis Pu.

## 2021-10-14 NOTE — Telephone Encounter (Signed)
Called pharmacy. Was told there is a law that controlled has to be picked up within 5 days of fill date, expired on 10/8.

## 2021-10-14 NOTE — Telephone Encounter (Deleted)
Pt called at 1:26p.  He said the pharmacy told him they cancelled the Methylphenidate '27mg'$  script because he didn't get there in time.  He needs that sent back in.  He wants the refill for Lithium Carbonate sent in as well.    [facility] [address], [place] [address] [facility]  [address] [place], [place] [address]  Phone:  [phone]  Fax:  [phone]   Next appt 1/3

## 2021-10-28 ENCOUNTER — Ambulatory Visit: Payer: Medicare HMO | Admitting: Psychiatry

## 2021-11-05 ENCOUNTER — Other Ambulatory Visit: Payer: Self-pay | Admitting: Psychiatry

## 2021-11-05 ENCOUNTER — Other Ambulatory Visit: Payer: Self-pay

## 2021-11-05 DIAGNOSIS — F319 Bipolar disorder, unspecified: Secondary | ICD-10-CM

## 2021-11-05 MED ORDER — QUETIAPINE FUMARATE 300 MG PO TABS: ORAL_TABLET | ORAL | 0 refills | Status: DC

## 2021-11-17 ENCOUNTER — Telehealth: Payer: Self-pay | Admitting: Psychiatry

## 2021-11-17 ENCOUNTER — Other Ambulatory Visit: Payer: Self-pay

## 2021-11-17 DIAGNOSIS — F900 Attention-deficit hyperactivity disorder, predominantly inattentive type: Secondary | ICD-10-CM

## 2021-11-17 MED ORDER — METHYLPHENIDATE HCL ER (OSM) 27 MG PO TBCR: 27.0000 mg | EXTENDED_RELEASE_TABLET | ORAL | 0 refills | Status: DC

## 2021-11-17 NOTE — Telephone Encounter (Signed)
Pended.

## 2021-11-17 NOTE — Telephone Encounter (Signed)
Pt requesting Rx for Methylpheidate 27 mg [facility]. Apt 1/3

## 2021-12-08 DIAGNOSIS — H26493 Other secondary cataract, bilateral: Secondary | ICD-10-CM | POA: Diagnosis not present

## 2021-12-11 ENCOUNTER — Telehealth: Payer: Self-pay | Admitting: Psychiatry

## 2021-12-11 DIAGNOSIS — F319 Bipolar disorder, unspecified: Secondary | ICD-10-CM

## 2021-12-11 MED ORDER — QUETIAPINE FUMARATE 300 MG PO TABS: ORAL_TABLET | ORAL | 1 refills | Status: DC

## 2021-12-11 NOTE — Telephone Encounter (Signed)
Brian Guerrero called this morning at 10:50 to request refill of his Seroquel.  Appt 01/06/22.  Needs to go to a new pharmacy - [name] at [facility]

## 2021-12-11 NOTE — Telephone Encounter (Signed)
Rx sent 

## 2021-12-13 ENCOUNTER — Other Ambulatory Visit: Payer: Self-pay | Admitting: Psychiatry

## 2021-12-13 DIAGNOSIS — F319 Bipolar disorder, unspecified: Secondary | ICD-10-CM

## 2021-12-18 ENCOUNTER — Other Ambulatory Visit: Payer: Self-pay

## 2021-12-18 ENCOUNTER — Telehealth: Payer: Self-pay | Admitting: Psychiatry

## 2021-12-18 DIAGNOSIS — F900 Attention-deficit hyperactivity disorder, predominantly inattentive type: Secondary | ICD-10-CM

## 2021-12-18 NOTE — Telephone Encounter (Signed)
Pended.

## 2021-12-18 NOTE — Telephone Encounter (Signed)
Pt called and said that he needs a refill on his generic concerta 27 mg. Ph[facility] on [address]

## 2021-12-21 MED ORDER — METHYLPHENIDATE HCL ER (OSM) 27 MG PO TBCR: 27.0000 mg | EXTENDED_RELEASE_TABLET | ORAL | 0 refills | Status: DC

## 2022-01-06 ENCOUNTER — Encounter: Payer: Self-pay | Admitting: Psychiatry

## 2022-01-06 ENCOUNTER — Ambulatory Visit: Payer: Medicare HMO | Admitting: Psychiatry

## 2022-01-06 VITALS — BP 132/75 | HR 75

## 2022-01-06 DIAGNOSIS — F411 Generalized anxiety disorder: Secondary | ICD-10-CM

## 2022-01-06 DIAGNOSIS — F5105 Insomnia due to other mental disorder: Secondary | ICD-10-CM

## 2022-01-06 DIAGNOSIS — G251 Drug-induced tremor: Secondary | ICD-10-CM

## 2022-01-06 DIAGNOSIS — R7989 Other specified abnormal findings of blood chemistry: Secondary | ICD-10-CM | POA: Diagnosis not present

## 2022-01-06 DIAGNOSIS — F900 Attention-deficit hyperactivity disorder, predominantly inattentive type: Secondary | ICD-10-CM

## 2022-01-06 DIAGNOSIS — R69 Illness, unspecified: Secondary | ICD-10-CM | POA: Diagnosis not present

## 2022-01-06 DIAGNOSIS — Z79899 Other long term (current) drug therapy: Secondary | ICD-10-CM

## 2022-01-06 DIAGNOSIS — F319 Bipolar disorder, unspecified: Secondary | ICD-10-CM | POA: Diagnosis not present

## 2022-01-06 MED ORDER — METHYLPHENIDATE HCL ER (OSM) 27 MG PO TBCR: 27.0000 mg | EXTENDED_RELEASE_TABLET | ORAL | 0 refills | Status: DC

## 2022-01-06 MED ORDER — VITAMIN D (ERGOCALCIFEROL) 1.25 MG (50000 UNIT) PO CAPS: 50000.0000 [IU] | ORAL_CAPSULE | ORAL | 1 refills | Status: DC

## 2022-01-06 MED ORDER — LITHIUM CARBONATE 300 MG PO CAPS: ORAL_CAPSULE | Freq: Two times a day (BID) | ORAL | 0 refills | Status: DC

## 2022-01-06 MED ORDER — QUETIAPINE FUMARATE 300 MG PO TABS: 300.0000 mg | ORAL_TABLET | Freq: Every day | ORAL | 0 refills | Status: DC

## 2022-01-06 NOTE — Progress Notes (Signed)
Brian Guerrero [id number] November 14, 1950 72 y.o.     Subjective:   Patient ID:  Brian Guerrero is a 72 y.o. (DOB [birth year]) male.  Chief Complaint:  Chief Complaint  Patient presents with   Follow-up    Bipolar I disorder ([place])   ADHD   Anxiety    Depression        Associated symptoms include no decreased concentration and no suicidal ideas.  Past medical history includes anxiety.   Anxiety Patient reports no chest pain, confusion, decreased concentration, nervous/anxious behavior, palpitations, shortness of breath or suicidal ideas.      Brian Guerrero presents to the office today for follow-up of med change.  At visit  March 07, 2018.  For problems with attention and focus Concerta was increased from 27 mg a day to 30 mg a day.  In January we reduced the Wellbutrin and added Concerta 27.  More stamina and more alert but still struggle with concentration and memory and tremor.  Can't say if it's new or a lifelong thing.  Tolerating the Concerta OK.  Thinks he could tolerate an increase in the Concerta.  Occ propranolol for tremor.  seen Jun 02, 2018.  No meds were changed. He had received benefit from the increase Concerta given in March.  seen 10/05/18.  Has stopped Concerta bc cost prohibitive.  Taking instead Ritalin and can't remember it TID.  Does get benefit but liked Concerta better.  Overall good and likes retirement.  Was too stressed at work before..  Tremors resolved.  Doing hobbies and travel. Brian Guerrero concerned about episode of mania she noticed in May.  Reduced sleep, faster driving, spending and talking faster.  Not more severe.  Lasted a few weeks and resolved after switch to Ritalin.  She doesn't see this now.  He didn't think it lasted more than a few days.  She thinks he didn't recognize it.  It was not severe. No med changes.  05/16/19 appt noted.\ Doing well with mood.  Liked Concerta better and hard to take 3 daily of Ritalin.  Otherwise no med  changes.  Cataract surgery helped. Enjoyed retirements. Patient reports stable mood and denies depressed or irritable moods.  Patient denies any recent difficulty with anxiety. Rare anxiety.   Patient denies difficulty with sleep initiation or maintenance. Denies appetite disturbance but he's had 20# weight loss. He's getting less sugar in retirement and stays active.  Patient reports that energy and motivation have been good.  Patient denies any difficulty with concentration.  Patient denies any suicidal ideation.  Sleep 8 hours.  Not oversleeping. Retired at 72 yo. Feels good about it.  Enjoying it so far.  Working on new schedule.  Tackling some home projects. Motivation is better.  No depression.   Plan no med changes:  02/26/2020 appointment with following noted: It is been a number of months since the last appointment.  Longer than requested by MD. Doing well and liking retirement. Motivation is still not great but Ritalin helps.   Tremor no worse. "As always, wanting to ask about cutting back on lithium and quetiapine bc under much less stress with retirement."  Didn't realize how much he was stressed. Quetiapine very effective for sleep and can't sleep if misses a dose. Admits he decreased quetiapine from 600 to 450 mg HS months ago. Patient reports stable mood and denies depressed or irritable moods.  Patient denies any recent difficulty with anxiety.  Patient denies difficulty with sleep initiation or maintenance.  Denies appetite disturbance.  Patient reports that energy and motivation have been good.  Patient denies any difficulty with concentration.  Patient denies any suicidal ideation. Plan: Continue lithium 600 mg daily and  OK to try reducing quetiapine to 300 mg nightly for bipolar depression. Continue Wellbutrin to 300 mg daily and  Ritalin 10 TID mg daily.    08/26/2020 appointment with the following noted: Seems to be working fine.  Rare sleep problems.  No SE with meds and no  hangover. Overall good mood.  Brian Guerrero noticed 3 day period of hypomania and he tried to address by increasing sleep and it resolved.  Not totally clear of depressive sx but not debilitating and Ritalin clearly helps functions and  alertness. Still some tremor. 1-2 cups coffee in AM. Plan: Continue meds except Wants to see again if Concerta could be affordable in place of Ritalin bc smoother and longer acting.  OK 36 mg  Watch vitamin D level  12/23/2020 appointment with the following noted: Wants to try Concerta again instead of Ritalin 10 TID. Doing well otherwise.  Mood is ok.  Denies unusual mood swings but mild hypomania 4-5 days recenlty without being problematic. Had neuropsych testing. Memory stable.  Suggesting psychotherapy. Propranolol helps tremor.   Plan: Continue lithium 600 mg daily, quetiapine 300 mg nightly, Wellbutrin 300 mg daily and Ritalin 10 mg 3 times daily.  He is also taking propranolol as needed tremor  04/01/2021 phone call.  Saw a neurologist about her tremors.  Wonders about stopping a medication that could be contributing to tremor.  Wife also voiced concerns about potential mild manic symptoms. Recommended that he stop the Wellbutrin which could contribute to both issues.  Call if symptoms worsen.  04/29/2021 appointment with the following noted:  seen with wife Brian Guerrero Got temporarily supply of concerta but insurance won't pay for more. Liked Concerta bc more effective, but maybe a little too strong with jumpiness. Stopped Wellbutrin about 4 weeks ago.  Didn't notice much change. Tremors are much improved overall. He thinks his mood is improved and pretty stable. Excitable.not depressed.  Sleep excellent with quetiapine. Admits being more hyper.  More aggressive out of character and others have noticed.  Increased sexual interest. Family concerned about his memory and has seen neurologist.  Wife concerned about his posture.  Wife says kids noticed a little  aggressiveness and some mild manic sx with less need for sleep. She noticed for several mos.  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 Plan: DC Wellbutrin and continue Ritalin 10 TID mg daily or Concerta 27 mg AM.    4/94/4967 appointment with the following noted: Remains on lithium 300 mg twice daily, Concerta 27 or 36 mg daily versus Ritalin 10 mg 3 times daily depending on availability, propranolol 20 to 40 mg twice daily as needed tremor, quetiapine 300 mg nightly Most recent Concerta 27 mg dose was #60 instead of #90.  He feels he's gotten more tolerant to Concerta and no SE Sees benefits with stimulants for task completion and more willing to do things and less fear of mistakes and more energy. Denies manic sx lately and thinks Brian Guerrero would agree. Sleep very well.   Tremors much improved but still affects fine motor.  Not depressed.    Anxiety usually ok. Fixing up second home in [place] ready for year round and doesn't like to call people which causes anxiety. Can be edgy driving with wife bc they are different.  Plan: vitamin D. It was drawn late 2022= 38 5000 units through winter and then drop back to 2000 units daily  01/06/2022 appointment noted: Good  Xmas with kids and grandkids.  Planning to move to [place] eventually in the next year or two.   Still some struggles with concentration and lack of interest.  Should be more motivated but nothing has really changed much.  Can enjoy things.  Is productive when in [place]. Psych meds: lithium 202 BID, Concerta 27 AM, quetiapine 300 HS , propranonol 10 prn.   No SE Concerta helps morning energy.  Not very productive by 4 pm.   Past Psychiatric Medication Trials: Propranolol 20 AM Lithium 600 mg max dose tolerated DT tremor Seroquel 300 Wellbutrin 300 Ritalin 10 mg TID Concerta 36 too much  Review of Systems:  Review of Systems  Respiratory:  Negative for shortness of breath.    Cardiovascular:  Negative for chest pain and palpitations.  Neurological:  Negative for tremors and weakness.       No falls  Psychiatric/Behavioral:  Negative for agitation, behavioral problems, confusion, decreased concentration, dysphoric mood, hallucinations, self-injury, sleep disturbance and suicidal ideas. The patient is hyperactive. The patient is not nervous/anxious.   tremor variable worse with caffeine  Medications: I have reviewed the patient's current medications.  Current Outpatient Medications  Medication Sig Dispense Refill   Acetylcysteine (NAC 600 PO) Take 1,200 mg by mouth daily.     aspirin EC 81 MG tablet Take 81 mg by mouth daily.     Carnitine-B5-B6 (L-CARNITINE 500 PO) Take 1,000 mg by mouth daily.     finasteride (PROSCAR) 5 MG tablet Take 1 tablet by mouth daily 90 tablet 3   [START ON 02/03/2022] methylphenidate 27 MG PO CR tablet Take 1 tablet (27 mg total) by mouth every morning. 30 tablet 0   [START ON 03/03/2022] methylphenidate 27 MG PO CR tablet Take 1 tablet (27 mg total) by mouth every morning. 30 tablet 0   propranolol (INDERAL) 10 MG tablet Take 2-4 tablets by mouth twice daily as needed for tremor (Patient taking differently: 1 daily) 540 tablet 0   Pyridoxine HCl (VITAMIN B6) 100 MG TABS Take 100 mg by mouth daily.     Vitamin D, Ergocalciferol, (DRISDOL) 1.25 MG (50000 UNIT) CAPS capsule Take 1 capsule (50,000 Units total) by mouth every 7 (seven) days. 15 capsule 1   lithium carbonate 300 MG capsule Take 1 capsule by mouth 2 (two) times daily. 180 capsule 0   methylphenidate (CONCERTA) 27 MG PO CR tablet Take 1 tablet (27 mg total) by mouth every morning. 30 tablet 0   QUEtiapine (SEROQUEL) 300 MG tablet Take 1 tablet (300 mg total) by mouth at bedtime. 90 tablet 0   sildenafil (VIAGRA) 100 MG tablet TAKE 1 TABLET BY MOUTH AS NEEDED AS DIRECTED (Patient not taking: Reported on 12/23/2020) 30 tablet 11   No current facility-administered medications for  this visit.    Medication Side Effect:  CO sluggish in the morning. Tremor returned.  Allergies:  Allergies  Allergen Reactions   Lincomycin Hcl     As a child/had a hive    Past Medical History:  Diagnosis Date   Attention or concentration deficit    Bipolar disorder    BPH (benign prostatic hyperplasia)    Diverticular disease    [facility]/per pt not aware of this   Lithium-induced tremor    Varicose veins    Wears glasses  Family History  Problem Relation Age of Onset   Depression Mother    Prostate cancer Father    Kidney disease Father    Dementia Father        unspecified type; sympton onset in late 80s/early 55s   Tremor Father    Parkinson's disease Sister     Social History   Socioeconomic History   Marital status: Married    Spouse name: Not on file   Number of children: Not on file   Years of education: 17   Highest education level: Bachelor's degree (e.g., BA, AB, BS)  Occupational History   Not on file  Tobacco Use   Smoking status: Never   Smokeless tobacco: Never  Vaping Use   Vaping Use: Never used  Substance and Sexual Activity   Alcohol use: Not Currently    Alcohol/week: 7.0 standard drinks of alcohol    Types: 7 Cans of beer per week    Comment: 1 beer nightly   Drug use: Not Currently   Sexual activity: Not on file  Other Topics Concern   Not on file  Social History Narrative   Not on file   Social Determinants of Health   Financial Resource Strain: Not on file  Food Insecurity: Not on file  Transportation Needs: Not on file  Physical Activity: Not on file  Stress: Not on file  Social Connections: Not on file  Intimate Partner Violence: Not on file    Past Medical History, Surgical history, Social history, and Family history were reviewed and updated as appropriate.   Please see review of systems for further details on the patient's review from today.   Objective:   Physical Exam:  BP 132/75   Pulse 75    Physical Exam Constitutional:      General: He is not in acute distress. Musculoskeletal:        General: No deformity.  Neurological:     Mental Status: He is alert and oriented to person, place, and time.     Cranial Nerves: No dysarthria.     Motor: Tremor present.     Coordination: Coordination normal.  Psychiatric:        Attention and Perception: Attention and perception normal. He does not perceive auditory or visual hallucinations.        Mood and Affect: Mood normal. Mood is not anxious or depressed. Affect is not labile, blunt, angry or inappropriate.        Speech: Speech normal. Speech is not rapid and pressured.        Behavior: Behavior normal. Behavior is not slowed. Behavior is cooperative.        Thought Content: Thought content normal. Thought content is not paranoid or delusional. Thought content does not include homicidal or suicidal ideation. Thought content does not include suicidal plan.        Cognition and Memory: Cognition and memory normal.        Judgment: Judgment normal.     Comments: Insight fair.  Perhaps mild depression not bad     Lab Review:     Component Value Date/Time   NA 139 08/31/2019 1631   K 4.3 08/31/2019 1631   CL 105 08/31/2019 1631   CO2 21 08/31/2019 1631   GLUCOSE 93 08/31/2019 1631   GLUCOSE 93 04/26/2013 0930   BUN 28 (H) 08/31/2019 1631   CREATININE 1.02 08/31/2019 1631   CALCIUM 9.3 08/31/2019 1631   GFRNONAA 75 08/31/2019 1631   GFRAA  86 08/31/2019 1631       Component Value Date/Time   WBC 6.4 04/26/2013 0930   RBC 4.69 04/26/2013 0930   HGB 14.6 04/26/2013 0930   HCT 42.3 04/26/2013 0930   PLT 266 04/26/2013 0930   MCV 90.2 04/26/2013 0930   MCH 31.1 04/26/2013 0930   MCHC 34.5 04/26/2013 0930   RDW 13.4 04/26/2013 0930   LYMPHSABS 1.2 04/26/2013 0930   MONOABS 0.7 04/26/2013 0930   EOSABS 0.3 04/26/2013 0930   BASOSABS 0.0 04/26/2013 0930    Lithium Lvl  Date Value Ref Range Status  08/29/2020 0.4  (L) 0.6 - 1.2 mmol/L Final   08/31/19 level at 430PM on dose 600 HS  No results found for: "PHENYTOIN", "PHENOBARB", "VALPROATE", "CBMZ"   .res Assessment: Plan:    Bipolar I disorder ([place]) - Plan: lithium carbonate 300 MG capsule, QUEtiapine (SEROQUEL) 300 MG tablet  Attention deficit hyperactivity disorder (ADHD), predominantly inattentive type - Plan: methylphenidate (CONCERTA) 27 MG PO CR tablet, methylphenidate 27 MG PO CR tablet, methylphenidate 27 MG PO CR tablet  Generalized anxiety disorder  Insomnia due to mental condition  Low vitamin D level  Lithium-induced tremor  Lithium use   I believe that his problems with concentration are related to a type of acquired ADD due to the consequence of chronic bipolar depression with inadequate response to typical bipolar disorder medications as well as Wellbutrin.  Since this concentration problem was serious enough to be affecting his job function and potentially forcing retirement I offered him the option of treating it with stimulants.  He agreed and has obtained substantial benefit from the Concerta especially after increasing from 27 mg up to 36 mg daily.  He has retired now but still needs the stimulant to help with concentration and focus, motivation and productivity at home. Disc importance of staying active to manage risk of depression.    Discussed the neurology and neuropsych testing with the patient and his wife.  It is unlikely that psych meds are causing cognitive impairment.  It is possible that they have been causing or contributing to tremor.  We will stop the Wellbutrin in an effort to minimize that effect and hope he does not relapse into depression.  We will reduce the Concerta to 27 mg daily to minimize tremor and reduce manic risk.  Discussed potential benefits, risks, and side effects of stimulants with patient to include increased heart rate, palpitations, insomnia, increased anxiety, increased irritability, or  decreased appetite.  Instructed patient to contact office if experiencing any significant tolerability issues.     Concerta 27 mg AM.   Answered questions about the differences  Tremor related to medications have recurred and he saw neurologist and disc that and neuropsych testing. Propranolol for tremor increase to 20- 40 mg BID prn .  He reports it helps the tremor. Disc SE in detail.  Continue lithium 600 mg daily and  Cont quetiapine to 300 mg nightly for bipolar depression. Reduction does have some risk of mania or depression.  Discussed potential metabolic side effects associated with atypical antipsychotics, as well as potential risk for movement side effects. Advised pt to contact office if movement side effects occur.   Counseled patient regarding potential benefits, risks, and side effects of lithium to include potential risk of lithium affecting thyroid and renal function.  Discussed need for periodic lab monitoring to determine drug level and to assess for potential adverse effects.  Counseled patient regarding signs and symptoms of lithium toxicity  and advised that they notify office immediately or seek urgent medical attention if experiencing these signs and symptoms.  Patient advised to contact office with any questions or concerns.  Recommend consistent use of propranolol before work to help with the tremor.  He has not done that but it is his choice. Saw Dr. Carles Collet for tremor and she also suggested neuropsych testing for memory concerns. Disc results from neuropsych testing and the suggestion about counseling.  Check lithium level  His vitamin D level was 36 in past and needs to be repeated and consideration could be given to increasing vitamin D. It was drawn late 2022= 38 Given Rx vit D bc not taking any  FU 3 mos  Lynder Parents, MD, DFAPA   No future appointments.   No orders of the defined types were placed in this encounter.      -------------------------------

## 2022-02-22 ENCOUNTER — Other Ambulatory Visit: Payer: Self-pay

## 2022-02-22 ENCOUNTER — Telehealth: Payer: Self-pay | Admitting: Psychiatry

## 2022-02-22 DIAGNOSIS — F900 Attention-deficit hyperactivity disorder, predominantly inattentive type: Secondary | ICD-10-CM

## 2022-02-22 NOTE — Telephone Encounter (Signed)
Pt reporting [facility] [address] in stock generic Concerta CR tablet 27 mg. Send Rx there. Apt 6/25

## 2022-02-22 NOTE — Telephone Encounter (Signed)
Pended.

## 2022-02-23 MED ORDER — METHYLPHENIDATE HCL ER (OSM) 27 MG PO TBCR: 27.0000 mg | EXTENDED_RELEASE_TABLET | ORAL | 0 refills | Status: DC

## 2022-03-25 ENCOUNTER — Other Ambulatory Visit: Payer: Self-pay

## 2022-03-25 ENCOUNTER — Telehealth: Payer: Self-pay | Admitting: Psychiatry

## 2022-03-25 DIAGNOSIS — F900 Attention-deficit hyperactivity disorder, predominantly inattentive type: Secondary | ICD-10-CM

## 2022-03-25 MED ORDER — METHYLPHENIDATE HCL ER (OSM) 27 MG PO TBCR: 27.0000 mg | EXTENDED_RELEASE_TABLET | ORAL | 0 refills | Status: DC

## 2022-03-25 NOTE — Telephone Encounter (Signed)
Patient called in for refill on Methylphenidate 27mg . Ph: [place] [facility]/25 [facility] [address] [place]

## 2022-03-25 NOTE — Telephone Encounter (Signed)
Pended.

## 2022-04-03 ENCOUNTER — Other Ambulatory Visit: Payer: Self-pay | Admitting: Psychiatry

## 2022-04-03 DIAGNOSIS — F319 Bipolar disorder, unspecified: Secondary | ICD-10-CM

## 2022-04-08 ENCOUNTER — Telehealth: Payer: Self-pay | Admitting: Psychiatry

## 2022-04-08 DIAGNOSIS — F319 Bipolar disorder, unspecified: Secondary | ICD-10-CM

## 2022-04-08 MED ORDER — LITHIUM CARBONATE 300 MG PO CAPS: ORAL_CAPSULE | Freq: Two times a day (BID) | ORAL | 0 refills | Status: DC

## 2022-04-08 MED ORDER — QUETIAPINE FUMARATE 300 MG PO TABS: ORAL_TABLET | ORAL | 0 refills | Status: DC

## 2022-04-08 NOTE — Telephone Encounter (Signed)
Next visit is 06/29/22. Requesting refills on Quetiapine 300 mg and Lithium 300 mg called to:   [facility] [address] [place], [place] - [address]   Phone: [phone]  Fax: [phone]

## 2022-04-08 NOTE — Telephone Encounter (Signed)
Sent!

## 2022-04-27 ENCOUNTER — Other Ambulatory Visit: Payer: Self-pay

## 2022-04-27 ENCOUNTER — Telehealth: Payer: Self-pay | Admitting: Psychiatry

## 2022-04-27 DIAGNOSIS — F900 Attention-deficit hyperactivity disorder, predominantly inattentive type: Secondary | ICD-10-CM

## 2022-04-27 MED ORDER — METHYLPHENIDATE HCL ER (OSM) 27 MG PO TBCR: 27.0000 mg | EXTENDED_RELEASE_TABLET | ORAL | 0 refills | Status: DC

## 2022-04-27 NOTE — Telephone Encounter (Signed)
Pended.

## 2022-04-27 NOTE — Telephone Encounter (Signed)
Fred called at [facility] [employer]. Would like to get RF on Concerta   sent to pharmacy in [place], he is out of town. Please send to : [id number] - [place], [place] - [address]

## 2022-05-17 ENCOUNTER — Telehealth: Payer: Self-pay | Admitting: Psychiatry

## 2022-05-17 DIAGNOSIS — F319 Bipolar disorder, unspecified: Secondary | ICD-10-CM

## 2022-05-17 MED ORDER — LITHIUM CARBONATE 300 MG PO CAPS: ORAL_CAPSULE | Freq: Two times a day (BID) | ORAL | 0 refills | Status: DC

## 2022-05-17 NOTE — Telephone Encounter (Signed)
Brian Guerrero called at 10:44 to request refill of his Lithium.  Appt 6/25.  Send to [facility] [address] [place], [place] - [address]

## 2022-05-17 NOTE — Telephone Encounter (Signed)
Sent!

## 2022-05-26 ENCOUNTER — Other Ambulatory Visit: Payer: Self-pay | Admitting: Psychiatry

## 2022-05-26 ENCOUNTER — Telehealth: Payer: Self-pay | Admitting: Psychiatry

## 2022-05-26 DIAGNOSIS — F900 Attention-deficit hyperactivity disorder, predominantly inattentive type: Secondary | ICD-10-CM

## 2022-05-26 MED ORDER — METHYLPHENIDATE HCL ER (OSM) 27 MG PO TBCR: 27.0000 mg | EXTENDED_RELEASE_TABLET | ORAL | 0 refills | Status: DC

## 2022-05-26 NOTE — Telephone Encounter (Signed)
Pt called at 10:16a.  He is requesting refill of Methylphenidate ER 27mg  to   [id number] - [place], [place] - [address], [place] [address] Phone: [phone]  Fax: [phone]    Next appt 6/25

## 2022-06-09 ENCOUNTER — Other Ambulatory Visit: Payer: Self-pay | Admitting: Psychiatry

## 2022-06-09 DIAGNOSIS — F319 Bipolar disorder, unspecified: Secondary | ICD-10-CM

## 2022-06-14 ENCOUNTER — Other Ambulatory Visit: Payer: Self-pay | Admitting: Psychiatry

## 2022-06-14 DIAGNOSIS — F319 Bipolar disorder, unspecified: Secondary | ICD-10-CM

## 2022-06-14 NOTE — Telephone Encounter (Signed)
Verify pharmacy

## 2022-06-29 ENCOUNTER — Ambulatory Visit: Payer: Medicare HMO | Admitting: Psychiatry

## 2022-07-14 DIAGNOSIS — L578 Other skin changes due to chronic exposure to nonionizing radiation: Secondary | ICD-10-CM | POA: Diagnosis not present

## 2022-07-14 DIAGNOSIS — Z85828 Personal history of other malignant neoplasm of skin: Secondary | ICD-10-CM | POA: Diagnosis not present

## 2022-07-14 DIAGNOSIS — C44529 Squamous cell carcinoma of skin of other part of trunk: Secondary | ICD-10-CM | POA: Diagnosis not present

## 2022-07-14 DIAGNOSIS — D1801 Hemangioma of skin and subcutaneous tissue: Secondary | ICD-10-CM | POA: Diagnosis not present

## 2022-07-14 DIAGNOSIS — D485 Neoplasm of uncertain behavior of skin: Secondary | ICD-10-CM | POA: Diagnosis not present

## 2022-07-14 DIAGNOSIS — L570 Actinic keratosis: Secondary | ICD-10-CM | POA: Diagnosis not present

## 2022-07-14 DIAGNOSIS — L814 Other melanin hyperpigmentation: Secondary | ICD-10-CM | POA: Diagnosis not present

## 2022-07-14 DIAGNOSIS — C44319 Basal cell carcinoma of skin of other parts of face: Secondary | ICD-10-CM | POA: Diagnosis not present

## 2022-07-14 DIAGNOSIS — L821 Other seborrheic keratosis: Secondary | ICD-10-CM | POA: Diagnosis not present

## 2022-07-21 ENCOUNTER — Encounter: Payer: Self-pay | Admitting: Psychiatry

## 2022-07-21 ENCOUNTER — Ambulatory Visit: Payer: Medicare HMO | Admitting: Psychiatry

## 2022-07-21 DIAGNOSIS — R7989 Other specified abnormal findings of blood chemistry: Secondary | ICD-10-CM

## 2022-07-21 DIAGNOSIS — F319 Bipolar disorder, unspecified: Secondary | ICD-10-CM

## 2022-07-21 DIAGNOSIS — F411 Generalized anxiety disorder: Secondary | ICD-10-CM | POA: Diagnosis not present

## 2022-07-21 DIAGNOSIS — Z79899 Other long term (current) drug therapy: Secondary | ICD-10-CM

## 2022-07-21 DIAGNOSIS — F900 Attention-deficit hyperactivity disorder, predominantly inattentive type: Secondary | ICD-10-CM

## 2022-07-21 DIAGNOSIS — G251 Drug-induced tremor: Secondary | ICD-10-CM

## 2022-07-21 DIAGNOSIS — E559 Vitamin D deficiency, unspecified: Secondary | ICD-10-CM

## 2022-07-21 DIAGNOSIS — Z1321 Encounter for screening for nutritional disorder: Secondary | ICD-10-CM

## 2022-07-21 DIAGNOSIS — F5105 Insomnia due to other mental disorder: Secondary | ICD-10-CM

## 2022-07-21 MED ORDER — METHYLPHENIDATE HCL ER (OSM) 27 MG PO TBCR: 27.0000 mg | EXTENDED_RELEASE_TABLET | ORAL | 0 refills | Status: DC

## 2022-07-21 MED ORDER — VITAMIN D (ERGOCALCIFEROL) 1.25 MG (50000 UNIT) PO CAPS: 50000.0000 [IU] | ORAL_CAPSULE | ORAL | 1 refills | Status: DC

## 2022-07-21 NOTE — Progress Notes (Addendum)
Brian Guerrero [id number] 28-Jan-1950 72 y.o.     Subjective:   Patient ID:  Brian Guerrero is a 72 y.o. (DOB 20-May-1950) male.  Chief Complaint:  Chief Complaint  Patient presents with   Follow-up   Depression   Anxiety      Brian Guerrero presents to the office today for follow-up of med change.  At visit  March 07, 2018.  For problems with attention and focus Concerta was increased from 27 mg a day to 30 mg a day.  In January we reduced the Wellbutrin and added Concerta 27.  More stamina and more alert but still struggle with concentration and memory and tremor.  Can't say if it's new or a lifelong thing.  Tolerating the Concerta OK.  Thinks he could tolerate an increase in the Concerta.  Occ propranolol for tremor.  seen Jun 02, 2018.  No meds were changed. He had received benefit from the increase Concerta given in March.  seen 10/05/18.  Has stopped Concerta bc cost prohibitive.  Taking instead Ritalin and can't remember it TID.  Does get benefit but liked Concerta better.  Overall good and likes retirement.  Was too stressed at work before..  Tremors resolved.  Doing hobbies and travel. Arline Asp concerned about episode of mania she noticed in May.  Reduced sleep, faster driving, spending and talking faster.  Not more severe.  Lasted a few weeks and resolved after switch to Ritalin.  She doesn't see this now.  He didn't think it lasted more than a few days.  She thinks he didn't recognize it.  It was not severe. No med changes.  05/16/19 appt noted.\ Doing well with mood.  Liked Concerta better and hard to take 3 daily of Ritalin.  Otherwise no med changes.  Cataract surgery helped. Enjoyed retirements. Patient reports stable mood and denies depressed or irritable moods.  Patient denies any recent difficulty with anxiety. Rare anxiety.   Patient denies difficulty with sleep initiation or maintenance. Denies appetite disturbance but he's had 20# weight loss. He's getting  less sugar in retirement and stays active.  Patient reports that energy and motivation have been good.  Patient denies any difficulty with concentration.  Patient denies any suicidal ideation.  Sleep 8 hours.  Not oversleeping. Retired at 72 yo. Feels good about it.  Enjoying it so far.  Working on new schedule.  Tackling some home projects. Motivation is better.  No depression.   Plan no med changes:  02/26/2020 appointment with following noted: It is been a number of months since the last appointment.  Longer than requested by MD. Doing well and liking retirement. Motivation is still not great but Ritalin helps.   Tremor no worse. "As always, wanting to ask about cutting back on lithium and quetiapine bc under much less stress with retirement."  Didn't realize how much he was stressed. Quetiapine very effective for sleep and can't sleep if misses a dose. Admits he decreased quetiapine from 600 to 450 mg HS months ago. Patient reports stable mood and denies depressed or irritable moods.  Patient denies any recent difficulty with anxiety.  Patient denies difficulty with sleep initiation or maintenance. Denies appetite disturbance.  Patient reports that energy and motivation have been good.  Patient denies any difficulty with concentration.  Patient denies any suicidal ideation. Plan: Continue lithium 600 mg daily and  OK to try reducing quetiapine to 300 mg nightly for bipolar depression. Continue Wellbutrin to 300 mg daily and  Ritalin 10 TID mg daily.    08/26/2020 appointment with the following noted: Seems to be working fine.  Rare sleep problems.  No SE with meds and no hangover. Overall good mood.  Arline Asp noticed 3 day period of hypomania and he tried to address by increasing sleep and it resolved.  Not totally clear of depressive sx but not debilitating and Ritalin clearly helps functions and  alertness. Still some tremor. 1-2 cups coffee in AM. Plan: Continue meds except Wants to see again  if Concerta could be affordable in place of Ritalin bc smoother and longer acting.  OK 36 mg  Watch vitamin D level  12/23/2020 appointment with the following noted: Wants to try Concerta again instead of Ritalin 10 TID. Doing well otherwise.  Mood is ok.  Denies unusual mood swings but mild hypomania 4-5 days recenlty without being problematic. Had neuropsych testing. Memory stable.  Suggesting psychotherapy. Propranolol helps tremor.   Plan: Continue lithium 600 mg daily, quetiapine 300 mg nightly, Wellbutrin 300 mg daily and Ritalin 10 mg 3 times daily.  He is also taking propranolol as needed tremor  04/01/2021 phone call.  Saw a neurologist about her tremors.  Wonders about stopping a medication that could be contributing to tremor.  Wife also voiced concerns about potential mild manic symptoms. Recommended that he stop the Wellbutrin which could contribute to both issues.  Call if symptoms worsen.  04/29/2021 appointment with the following noted:  seen with wife Arline Asp Got temporarily supply of concerta but insurance won't pay for more. Liked Concerta bc more effective, but maybe a little too strong with jumpiness. Stopped Wellbutrin about 4 weeks ago.  Didn't notice much change. Tremors are much improved overall. He thinks his mood is improved and pretty stable. Excitable.not depressed.  Sleep excellent with quetiapine. Admits being more hyper.  More aggressive out of character and others have noticed.  Increased sexual interest. Family concerned about his memory and has seen neurologist.  Wife concerned about his posture.  Wife says kids noticed a little aggressiveness and some mild manic sx with less need for sleep. She noticed for several mos.  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 Plan: DC Wellbutrin and continue Ritalin 10 TID mg daily or Concerta 27 mg AM.    07/28/2021 appointment with the following noted: Remains on lithium 300 mg twice  daily, Concerta 27 or 36 mg daily versus Ritalin 10 mg 3 times daily depending on availability, propranolol 20 to 40 mg twice daily as needed tremor, quetiapine 300 mg nightly Most recent Concerta 27 mg dose was #60 instead of #90.  He feels he's gotten more tolerant to Concerta and no SE Sees benefits with stimulants for task completion and more willing to do things and less fear of mistakes and more energy. Denies manic sx lately and thinks Arline Asp would agree. Sleep very well.   Tremors much improved but still affects fine motor.  Not depressed.    Anxiety usually ok. Fixing up second home in [address] ready for year round and doesn't like to call people which causes anxiety. Can be edgy driving with wife bc they are different. Plan: vitamin D. It was drawn late 2022= 38 5000 units through winter and then drop back to 2000 units daily  01/06/2022 appointment noted: Good  Xmas with kids and grandkids.  Planning to move to [address] eventually in the next year or two.   Still some struggles with concentration and  lack of interest.  Should be more motivated but nothing has really changed much.  Can enjoy things.  Is productive when in [address]. Psych meds: lithium 300 BID, Concerta 27 AM, quetiapine 300 HS , propranonol 10 prn.   No SE Concerta helps morning energy.  Not very productive by 4 pm. Plan: Check lithium level and continue other meds.  07/21/2022 appointment noted: Current psych meds lithium 300 mg twice daily, Concerta 27 mg every morning, propranolol 10 mg tablets 1-2 twice daily as needed for tremor, vitamin B6 for tremor, quetiapine 300 mg nightly, vitamin D.  Because level the 30s. Lithium level ordered at the last visit but received. Half here and half in [place]. Will be in [place] until 08/24/22 and back in October.   Met some friendly people there. Some struggle with motivation but nothing unusual with mood and anxiety.  Tremor are a good bit better.  Still taking B6 and propranolol.  No  other SE. Sleep good with quetiapine and acitivity. Good interest and enjoyment generally.  No sig anxiety other than normal worry. Health is good. W enjoys hiking.  He struggles to keep up with her.    Past Psychiatric Medication Trials: Propranolol 20 AM Lithium 600 mg max dose tolerated DT tremor Seroquel 300 Wellbutrin 300 Ritalin 10 mg TID Concerta 36 too much  Review of Systems:  Review of Systems  Respiratory:  Negative for shortness of breath.   Cardiovascular:  Negative for chest pain and palpitations.  Neurological:  Negative for tremors.       No falls  Psychiatric/Behavioral:  Positive for depression. Negative for agitation, behavioral problems, confusion, decreased concentration, dysphoric mood, hallucinations, self-injury, sleep disturbance and suicidal ideas. The patient is not nervous/anxious and is not hyperactive.   tremor variable worse with caffeine  Medications: I have reviewed the patient's current medications.  Current Outpatient Medications  Medication Sig Dispense Refill   Acetylcysteine (NAC 600 PO) Take 1,200 mg by mouth daily.     aspirin EC 81 MG tablet Take 81 mg by mouth daily.     Carnitine-B5-B6 (L-CARNITINE 500 PO) Take 1,000 mg by mouth daily.     finasteride (PROSCAR) 5 MG tablet Take 1 tablet by mouth daily 90 tablet 3   methylphenidate 27 MG PO CR tablet Take 1 tablet (27 mg total) by mouth every morning. 30 tablet 0   propranolol (INDERAL) 10 MG tablet Take 2-4 tablets by mouth twice daily as needed for tremor (Patient taking differently: 1 daily) 540 tablet 0   Pyridoxine HCl (VITAMIN B6) 100 MG TABS Take 100 mg by mouth daily.     QUEtiapine (SEROQUEL) 300 MG tablet TAKE 1 TABLET BY MOUTH EVERYDAY AT BEDTIME 60 tablet 0   lithium carbonate 300 MG capsule TAKE ONE CAPSULE BY MOUTH TWICE A DAY 180 capsule 0   methylphenidate (CONCERTA) 27 MG PO CR tablet Take 1 tablet (27 mg total) by mouth every morning. 30 tablet 0   methylphenidate 27 MG  PO CR tablet Take 1 tablet (27 mg total) by mouth every morning. 60 tablet 0   sildenafil (VIAGRA) 100 MG tablet TAKE 1 TABLET BY MOUTH AS NEEDED AS DIRECTED (Patient not taking: Reported on 12/23/2020) 30 tablet 11   Vitamin D, Ergocalciferol, (DRISDOL) 1.25 MG (50000 UNIT) CAPS capsule Take 1 capsule (50,000 Units total) by mouth every 7 (seven) days. 15 capsule 1   No current facility-administered medications for this visit.    Medication Side Effect:  CO sluggish  in the morning. Tremor returned.  Allergies:  Allergies  Allergen Reactions   Lincomycin Hcl     As a child/had a hive    Past Medical History:  Diagnosis Date   Attention or concentration deficit    Bipolar disorder    BPH (benign prostatic hyperplasia)    Diverticular disease    [facility]/per pt not aware of this   Lithium-induced tremor    Varicose veins    Wears glasses     Family History  Problem Relation Age of Onset   Depression Mother    Prostate cancer Father    Kidney disease Father    Dementia Father        unspecified type; sympton onset in late 80s/early 68s   Tremor Father    Parkinson's disease Sister     Social History   Socioeconomic History   Marital status: Married    Spouse name: Not on file   Number of children: Not on file   Years of education: 17   Highest education level: Bachelor's degree (e.g., BA, AB, BS)  Occupational History   Not on file  Tobacco Use   Smoking status: Never   Smokeless tobacco: Never  Vaping Use   Vaping status: Never Used  Substance and Sexual Activity   Alcohol use: Not Currently    Alcohol/week: 7.0 standard drinks of alcohol    Types: 7 Cans of beer per week    Comment: 1 beer nightly   Drug use: Not Currently   Sexual activity: Not on file  Other Topics Concern   Not on file  Social History Narrative   Not on file   Social Determinants of Health   Financial Resource Strain: Not on file  Food Insecurity: Not on file  Transportation  Needs: Not on file  Physical Activity: Not on file  Stress: Not on file  Social Connections: Not on file  Intimate Partner Violence: Not on file    Past Medical History, Surgical history, Social history, and Family history were reviewed and updated as appropriate.   Please see review of systems for further details on the patient's review from today.   Objective:   Physical Exam:  There were no vitals taken for this visit.  Physical Exam Constitutional:      General: He is not in acute distress. Musculoskeletal:        General: No deformity.  Neurological:     Mental Status: He is alert and oriented to person, place, and time.     Cranial Nerves: No dysarthria.     Motor: Tremor present.     Coordination: Coordination normal.  Psychiatric:        Attention and Perception: Attention and perception normal. He does not perceive auditory or visual hallucinations.        Mood and Affect: Mood normal. Mood is not anxious or depressed. Affect is not labile, blunt, angry or inappropriate.        Speech: Speech normal. Speech is not rapid and pressured.        Behavior: Behavior normal. Behavior is not slowed or hyperactive. Behavior is cooperative.        Thought Content: Thought content normal. Thought content is not paranoid or delusional. Thought content does not include homicidal or suicidal ideation. Thought content does not include suicidal plan.        Cognition and Memory: Cognition and memory normal.        Judgment: Judgment normal.  Comments: Insight fair.  Perhaps mild depression not bad     Lab Review:     Component Value Date/Time   NA 140 07/30/2022 1537   NA 139 08/31/2019 1631   K 4.3 07/30/2022 1537   CL 106 07/30/2022 1537   CO2 24 07/30/2022 1537   GLUCOSE 93 07/30/2022 1537   BUN 19 07/30/2022 1537   BUN 28 (H) 08/31/2019 1631   CREATININE 1.15 07/30/2022 1537   CALCIUM 9.8 07/30/2022 1537   GFRNONAA 75 08/31/2019 1631   GFRAA 86 08/31/2019 1631        Component Value Date/Time   WBC 6.4 04/26/2013 0930   RBC 4.69 04/26/2013 0930   HGB 14.6 04/26/2013 0930   HCT 42.3 04/26/2013 0930   PLT 266 04/26/2013 0930   MCV 90.2 04/26/2013 0930   MCH 31.1 04/26/2013 0930   MCHC 34.5 04/26/2013 0930   RDW 13.4 04/26/2013 0930   LYMPHSABS 1.2 04/26/2013 0930   MONOABS 0.7 04/26/2013 0930   EOSABS 0.3 04/26/2013 0930   BASOSABS 0.0 04/26/2013 0930    Lithium Lvl  Date Value Ref Range Status  07/30/2022 0.5 (L) 0.6 - 1.2 mmol/L Final   08/31/19 level at 430PM on dose 600 HS  No results found for: "PHENYTOIN", "PHENOBARB", "VALPROATE", "CBMZ"   .res Assessment: Plan:    Bipolar I disorder (HCC) - Plan: Lithium level, Basic metabolic panel, TSH  Attention deficit hyperactivity disorder (ADHD), predominantly inattentive type - Plan: methylphenidate (CONCERTA) 27 MG PO CR tablet  Generalized anxiety disorder  Insomnia due to mental condition  Low vitamin D level - Plan: VITAMIN D 25 Hydroxy (Vit-D Deficiency, Fractures), Vitamin D, Ergocalciferol, (DRISDOL) 1.25 MG (50000 UNIT) CAPS capsule  Lithium-induced tremor  Lithium use - Plan: Lithium level, Basic metabolic panel, TSH  Avitaminosis D - Plan: Vitamin D, Ergocalciferol, (DRISDOL) 1.25 MG (50000 UNIT) CAPS capsule  Encounter for vitamin deficiency screening - Plan: Vitamin D, Ergocalciferol, (DRISDOL) 1.25 MG (50000 UNIT) CAPS capsule   I believe that his problems with concentration are related to a type of acquired ADD due to the consequence of chronic bipolar depression with inadequate response to typical bipolar disorder medications as well as Wellbutrin.  Since this concentration problem was serious enough to be affecting his job function and potentially forcing retirement I offered him the option of treating it with stimulants.   He has retired now but still needs the stimulant to help with concentration and focus, motivation and productivity at home.  Retirement a  benefit to mental health. Disc importance of staying active to manage risk of depression.    Discussed the neurology and neuropsych testing with the patient and his wife.  It is unlikely that psych meds are causing cognitive impairment.  It is possible that they have been causing or contributing to tremor.  We will stop the Wellbutrin in an effort to minimize that effect and hope he does not relapse into depression.  We will reduce the Concerta to 27 mg daily to minimize tremor and reduce manic risk.  Discussed potential benefits, risks, and side effects of stimulants with patient to include increased heart rate, palpitations, insomnia, increased anxiety, increased irritability, or decreased appetite.  Instructed patient to contact office if experiencing any significant tolerability issues.   The main benefit is mood, energy, alertness.  Still some conc and motivation px.  Concerta 27 mg AM.    Tremor related to medications have recurred and he saw neurologist and disc that and neuropsych  testing. Propranolol for tremor increase to 20- 40 mg BID prn .  He reports it helps the tremor. Disc SE in detail.  Continue lithium 600 mg daily and  Cont quetiapine to 300 mg nightly for bipolar depression. Reduction does have some risk of mania or depression.  Discussed potential metabolic side effects associated with atypical antipsychotics, as well as potential risk for movement side effects. Advised pt to contact office if movement side effects occur.   Counseled patient regarding potential benefits, risks, and side effects of lithium to include potential risk of lithium affecting thyroid and renal function.  Discussed need for periodic lab monitoring to determine drug level and to assess for potential adverse effects.  Counseled patient regarding signs and symptoms of lithium toxicity and advised that they notify office immediately or seek urgent medical attention if experiencing these signs and symptoms.   Patient advised to contact office with any questions or concerns.  Recommend consistent use of propranolol before work to help with the tremor.  He has not done that but it is his choice. Saw Dr. Arbutus Leas for tremor and she also suggested neuropsych testing for memory concerns. Disc results from neuropsych testing and the suggestion about counseling.  Check lithium level and labs and D  His vitamin D level was 36 in past and needs to be repeated and consideration could be given to increasing vitamin D. It was drawn late 2022= 38 Given Rx vit D bc not taking any  FU 6 mos  Meredith Staggers, MD, DFAPA   Future Appointments  Date Time Provider Department Center  01/25/2023  4:00 PM Cottle, Steva Ready., MD [facility] None      Orders Placed This Encounter  Procedures   Lithium level   VITAMIN D 25 Hydroxy (Vit-D Deficiency, Fractures)   Basic metabolic panel   TSH      -------------------------------

## 2022-07-21 NOTE — Patient Instructions (Signed)
Get labs at [facility]

## 2022-07-30 DIAGNOSIS — R7989 Other specified abnormal findings of blood chemistry: Secondary | ICD-10-CM | POA: Diagnosis not present

## 2022-07-30 DIAGNOSIS — F3130 Bipolar disorder, current episode depressed, mild or moderate severity, unspecified: Secondary | ICD-10-CM | POA: Diagnosis not present

## 2022-07-30 DIAGNOSIS — F319 Bipolar disorder, unspecified: Secondary | ICD-10-CM | POA: Diagnosis not present

## 2022-07-30 DIAGNOSIS — Z1321 Encounter for screening for nutritional disorder: Secondary | ICD-10-CM | POA: Diagnosis not present

## 2022-07-30 DIAGNOSIS — Z79899 Other long term (current) drug therapy: Secondary | ICD-10-CM | POA: Diagnosis not present

## 2022-07-30 DIAGNOSIS — E559 Vitamin D deficiency, unspecified: Secondary | ICD-10-CM | POA: Diagnosis not present

## 2022-08-02 DIAGNOSIS — R972 Elevated prostate specific antigen [PSA]: Secondary | ICD-10-CM | POA: Diagnosis not present

## 2022-08-17 DIAGNOSIS — R972 Elevated prostate specific antigen [PSA]: Secondary | ICD-10-CM | POA: Diagnosis not present

## 2022-08-17 DIAGNOSIS — N5201 Erectile dysfunction due to arterial insufficiency: Secondary | ICD-10-CM | POA: Diagnosis not present

## 2022-08-17 DIAGNOSIS — R3915 Urgency of urination: Secondary | ICD-10-CM | POA: Diagnosis not present

## 2022-08-17 DIAGNOSIS — N401 Enlarged prostate with lower urinary tract symptoms: Secondary | ICD-10-CM | POA: Diagnosis not present

## 2022-08-17 DIAGNOSIS — R350 Frequency of micturition: Secondary | ICD-10-CM | POA: Diagnosis not present

## 2022-08-23 ENCOUNTER — Other Ambulatory Visit: Payer: Self-pay

## 2022-08-23 ENCOUNTER — Telehealth: Payer: Self-pay | Admitting: Psychiatry

## 2022-08-23 DIAGNOSIS — F900 Attention-deficit hyperactivity disorder, predominantly inattentive type: Secondary | ICD-10-CM

## 2022-08-23 MED ORDER — METHYLPHENIDATE HCL ER (OSM) 27 MG PO TBCR: 27.0000 mg | EXTENDED_RELEASE_TABLET | ORAL | 0 refills | Status: DC

## 2022-08-23 NOTE — Telephone Encounter (Signed)
Pended.

## 2022-08-23 NOTE — Telephone Encounter (Signed)
Pt called at 9:31a.  He would like refill of Methylphenidate 27mg  to   [facility] [id number] [place], [place] - [address] [place], [place] [address] Phone: [phone]  Fax: [phone]   He said he is going out of town tomorrow morning for a month and a half and wanted to know if we could get 60 day refill sent.    Next appt 1/21

## 2022-09-16 ENCOUNTER — Other Ambulatory Visit: Payer: Self-pay | Admitting: Psychiatry

## 2022-09-16 DIAGNOSIS — F319 Bipolar disorder, unspecified: Secondary | ICD-10-CM

## 2022-10-22 ENCOUNTER — Other Ambulatory Visit: Payer: Self-pay | Admitting: Psychiatry

## 2022-10-22 DIAGNOSIS — F900 Attention-deficit hyperactivity disorder, predominantly inattentive type: Secondary | ICD-10-CM

## 2022-11-08 DIAGNOSIS — Z23 Encounter for immunization: Secondary | ICD-10-CM | POA: Diagnosis not present

## 2022-11-08 DIAGNOSIS — F909 Attention-deficit hyperactivity disorder, unspecified type: Secondary | ICD-10-CM | POA: Diagnosis not present

## 2022-11-08 DIAGNOSIS — N400 Enlarged prostate without lower urinary tract symptoms: Secondary | ICD-10-CM | POA: Diagnosis not present

## 2022-11-08 DIAGNOSIS — F3181 Bipolar II disorder: Secondary | ICD-10-CM | POA: Diagnosis not present

## 2022-11-08 DIAGNOSIS — Z1211 Encounter for screening for malignant neoplasm of colon: Secondary | ICD-10-CM | POA: Diagnosis not present

## 2022-11-08 DIAGNOSIS — Z0000 Encounter for general adult medical examination without abnormal findings: Secondary | ICD-10-CM | POA: Diagnosis not present

## 2022-11-08 DIAGNOSIS — Z136 Encounter for screening for cardiovascular disorders: Secondary | ICD-10-CM | POA: Diagnosis not present

## 2022-11-08 DIAGNOSIS — G250 Essential tremor: Secondary | ICD-10-CM | POA: Diagnosis not present

## 2022-11-15 DIAGNOSIS — R69 Illness, unspecified: Secondary | ICD-10-CM | POA: Diagnosis not present

## 2022-11-16 DIAGNOSIS — C44319 Basal cell carcinoma of skin of other parts of face: Secondary | ICD-10-CM | POA: Diagnosis not present

## 2022-11-23 DIAGNOSIS — Z008 Encounter for other general examination: Secondary | ICD-10-CM | POA: Diagnosis not present

## 2022-11-24 ENCOUNTER — Other Ambulatory Visit: Payer: Self-pay

## 2022-11-24 ENCOUNTER — Telehealth: Payer: Self-pay | Admitting: Psychiatry

## 2022-11-24 DIAGNOSIS — F900 Attention-deficit hyperactivity disorder, predominantly inattentive type: Secondary | ICD-10-CM

## 2022-11-24 MED ORDER — METHYLPHENIDATE HCL ER (OSM) 27 MG PO TBCR: 27.0000 mg | EXTENDED_RELEASE_TABLET | ORAL | 0 refills | Status: DC

## 2022-11-24 NOTE — Telephone Encounter (Signed)
Patient called in for refill on Methylphenidate 27mg . PH: [phone] Appt 1/21 Pharmacy [facility] [address] [place], [place]

## 2022-11-24 NOTE — Telephone Encounter (Signed)
Pended one RF of methylphenidate to requested pharmacy.

## 2022-12-10 ENCOUNTER — Other Ambulatory Visit: Payer: Self-pay | Admitting: Psychiatry

## 2022-12-10 DIAGNOSIS — F319 Bipolar disorder, unspecified: Secondary | ICD-10-CM

## 2022-12-13 DIAGNOSIS — R69 Illness, unspecified: Secondary | ICD-10-CM | POA: Diagnosis not present

## 2022-12-13 DIAGNOSIS — H26493 Other secondary cataract, bilateral: Secondary | ICD-10-CM | POA: Diagnosis not present

## 2022-12-26 DIAGNOSIS — Z0100 Encounter for examination of eyes and vision without abnormal findings: Secondary | ICD-10-CM | POA: Diagnosis not present

## 2022-12-27 ENCOUNTER — Other Ambulatory Visit: Payer: Self-pay | Admitting: Psychiatry

## 2022-12-27 ENCOUNTER — Telehealth: Payer: Self-pay | Admitting: Psychiatry

## 2022-12-27 ENCOUNTER — Other Ambulatory Visit: Payer: Self-pay

## 2022-12-27 DIAGNOSIS — F900 Attention-deficit hyperactivity disorder, predominantly inattentive type: Secondary | ICD-10-CM

## 2022-12-27 DIAGNOSIS — F319 Bipolar disorder, unspecified: Secondary | ICD-10-CM

## 2022-12-27 MED ORDER — METHYLPHENIDATE HCL ER (OSM) 27 MG PO TBCR
27.0000 mg | EXTENDED_RELEASE_TABLET | ORAL | 0 refills | Status: DC
Start: 2022-12-27 — End: 2023-03-22

## 2022-12-27 NOTE — Telephone Encounter (Signed)
Pt LVM @ 9:39a requesting refill of Methylphenidate 27mg  to   [facility] [id number] [place], [place] - [address] [place], [place] [address]   Next appt 1/21

## 2022-12-27 NOTE — Telephone Encounter (Signed)
Pended methylphenidate to rqst pharm.

## 2023-01-24 ENCOUNTER — Other Ambulatory Visit: Payer: Self-pay | Admitting: Psychiatry

## 2023-01-24 DIAGNOSIS — F319 Bipolar disorder, unspecified: Secondary | ICD-10-CM

## 2023-01-25 ENCOUNTER — Encounter: Payer: Self-pay | Admitting: Psychiatry

## 2023-01-25 ENCOUNTER — Ambulatory Visit: Payer: Medicare Other | Admitting: Psychiatry

## 2023-01-25 DIAGNOSIS — F5105 Insomnia due to other mental disorder: Secondary | ICD-10-CM

## 2023-01-25 DIAGNOSIS — R7989 Other specified abnormal findings of blood chemistry: Secondary | ICD-10-CM

## 2023-01-25 DIAGNOSIS — F900 Attention-deficit hyperactivity disorder, predominantly inattentive type: Secondary | ICD-10-CM

## 2023-01-25 DIAGNOSIS — F319 Bipolar disorder, unspecified: Secondary | ICD-10-CM

## 2023-01-25 DIAGNOSIS — G251 Drug-induced tremor: Secondary | ICD-10-CM

## 2023-01-25 DIAGNOSIS — Z79899 Other long term (current) drug therapy: Secondary | ICD-10-CM

## 2023-01-25 DIAGNOSIS — F411 Generalized anxiety disorder: Secondary | ICD-10-CM | POA: Diagnosis not present

## 2023-01-25 NOTE — Progress Notes (Signed)
Brian Guerrero [id number] Jan 16, 1950 73 y.o.     Subjective:   Patient ID:  Brian Guerrero is a 73 y.o. (DOB 1950-05-28) male.  Chief Complaint:  No chief complaint on file.     Brian Guerrero presents to the office today for follow-up of med change.  At visit  March 07, 2018.  For problems with attention and focus Concerta was increased from 27 mg a day to 30 mg a day.  In January we reduced the Wellbutrin and added Concerta 27.  More stamina and more alert but still struggle with concentration and memory and tremor.  Can't say if it's new or a lifelong thing.  Tolerating the Concerta OK.  Thinks he could tolerate an increase in the Concerta.  Occ propranolol for tremor.  seen Jun 02, 2018.  No meds were changed. He had received benefit from the increase Concerta given in March.  seen 10/05/18.  Has stopped Concerta bc cost prohibitive.  Taking instead Ritalin and can't remember it TID.  Does get benefit but liked Concerta better.  Overall good and likes retirement.  Was too stressed at work before..  Tremors resolved.  Doing hobbies and travel. Brian Guerrero concerned about episode of mania she noticed in May.  Reduced sleep, faster driving, spending and talking faster.  Not more severe.  Lasted a few weeks and resolved after switch to Ritalin.  She doesn't see this now.  He didn't think it lasted more than a few days.  She thinks he didn't recognize it.  It was not severe. No med changes.  05/16/19 appt noted.\ Doing well with mood.  Liked Concerta better and hard to take 3 daily of Ritalin.  Otherwise no med changes.  Cataract surgery helped. Enjoyed retirements. Patient reports stable mood and denies depressed or irritable moods.  Patient denies any recent difficulty with anxiety. Rare anxiety.   Patient denies difficulty with sleep initiation or maintenance. Denies appetite disturbance but he's had 20# weight loss. He's getting less sugar in retirement and stays active.  Patient  reports that energy and motivation have been good.  Patient denies any difficulty with concentration.  Patient denies any suicidal ideation.  Sleep 8 hours.  Not oversleeping. Retired at 73 yo. Feels good about it.  Enjoying it so far.  Working on new schedule.  Tackling some home projects. Motivation is better.  No depression.   Plan no med changes:  02/26/2020 appointment with following noted: It is been a number of months since the last appointment.  Longer than requested by MD. Doing well and liking retirement. Motivation is still not great but Ritalin helps.   Tremor no worse. "As always, wanting to ask about cutting back on lithium and quetiapine bc under much less stress with retirement."  Didn't realize how much he was stressed. Quetiapine very effective for sleep and can't sleep if misses a dose. Admits he decreased quetiapine from 600 to 450 mg HS months ago. Patient reports stable mood and denies depressed or irritable moods.  Patient denies any recent difficulty with anxiety.  Patient denies difficulty with sleep initiation or maintenance. Denies appetite disturbance.  Patient reports that energy and motivation have been good.  Patient denies any difficulty with concentration.  Patient denies any suicidal ideation. Plan: Continue lithium 600 mg daily and  OK to try reducing quetiapine to 300 mg nightly for bipolar depression. Continue Wellbutrin to 300 mg daily and  Ritalin 10 TID mg daily.    08/26/2020 appointment with  the following noted: Seems to be working fine.  Rare sleep problems.  No SE with meds and no hangover. Overall good mood.  Brian Guerrero noticed 3 day period of hypomania and he tried to address by increasing sleep and it resolved.  Not totally clear of depressive sx but not debilitating and Ritalin clearly helps functions and  alertness. Still some tremor. 1-2 cups coffee in AM. Plan: Continue meds except Wants to see again if Concerta could be affordable in place of Ritalin  bc smoother and longer acting.  OK 36 mg  Watch vitamin D level  12/23/2020 appointment with the following noted: Wants to try Concerta again instead of Ritalin 10 TID. Doing well otherwise.  Mood is ok.  Denies unusual mood swings but mild hypomania 4-5 days recenlty without being problematic. Had neuropsych testing. Memory stable.  Suggesting psychotherapy. Propranolol helps tremor.   Plan: Continue lithium 600 mg daily, quetiapine 300 mg nightly, Wellbutrin 300 mg daily and Ritalin 10 mg 3 times daily.  He is also taking propranolol as needed tremor  04/01/2021 phone call.  Saw a neurologist about her tremors.  Wonders about stopping a medication that could be contributing to tremor.  Wife also voiced concerns about potential mild manic symptoms. Recommended that he stop the Wellbutrin which could contribute to both issues.  Call if symptoms worsen.  04/29/2021 appointment with the following noted:  seen with wife Brian Guerrero Got temporarily supply of concerta but insurance won't pay for more. Liked Concerta bc more effective, but maybe a little too strong with jumpiness. Stopped Wellbutrin about 4 weeks ago.  Didn't notice much change. Tremors are much improved overall. He thinks his mood is improved and pretty stable. Excitable.not depressed.  Sleep excellent with quetiapine. Admits being more hyper.  More aggressive out of character and others have noticed.  Increased sexual interest. Family concerned about his memory and has seen neurologist.  Wife concerned about his posture.  Wife says kids noticed a little aggressiveness and some mild manic sx with less need for sleep. She noticed for several mos.  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 Plan: DC Wellbutrin and continue Ritalin 10 TID mg daily or Concerta 27 mg AM.    07/28/2021 appointment with the following noted: Remains on lithium 300 mg twice daily, Concerta 27 or 36 mg daily versus Ritalin 10 mg 3  times daily depending on availability, propranolol 20 to 40 mg twice daily as needed tremor, quetiapine 300 mg nightly Most recent Concerta 27 mg dose was #60 instead of #90.  He feels he's gotten more tolerant to Concerta and no SE Sees benefits with stimulants for task completion and more willing to do things and less fear of mistakes and more energy. Denies manic sx lately and thinks Brian Guerrero would agree. Sleep very well.   Tremors much improved but still affects fine motor.  Not depressed.    Anxiety usually ok. Fixing up second home in [address] ready for year round and doesn't like to call people which causes anxiety. Can be edgy driving with wife bc they are different. Plan: vitamin D. It was drawn late 2022= 38 5000 units through winter and then drop back to 2000 units daily  01/06/2022 appointment noted: Good  Xmas with kids and grandkids.  Planning to move to [address] eventually in the next year or two.   Still some struggles with concentration and lack of interest.  Should be more motivated but nothing has  really changed much.  Can enjoy things.  Is productive when in [address]. Psych meds: lithium 300 BID, Concerta 27 AM, quetiapine 300 HS , propranonol 10 prn.   No SE Concerta helps morning energy.  Not very productive by 4 pm. Plan: Check lithium level and continue other meds.  07/21/2022 appointment noted: Current psych meds lithium 300 mg twice daily, Concerta 27 mg every morning, propranolol 10 mg tablets 1-2 twice daily as needed for tremor, vitamin B6 for tremor, quetiapine 300 mg nightly, vitamin D.  Because level the 30s. Lithium level ordered at the last visit but received. Half here and half in [place]. Will be in [place] until 08/24/22 and back in October.   Met some friendly people there. Some struggle with motivation but nothing unusual with mood and anxiety.  Tremor are a good bit better.  Still taking B6 and propranolol.  No other SE. Sleep good with quetiapine and  acitivity. Good interest and enjoyment generally.  No sig anxiety other than normal worry. Health is good. W enjoys hiking.  He struggles to keep up with her.    01/25/23 appt noted: Current psych meds lithium 300 mg 2 each evening, Concerta 27 mg every morning, propranolol 10 mg tablets 1-2 twice daily as needed for tremor, vitamin B6 for tremor, quetiapine 300 mg nightly, vitamin D.  Because level the 30s.  Vid D 50K weekly. Lithium level ordered at the last visit but received. Felt responsible for aunt Brian Guerrero in [place] in hosp then rehab.  Had to help her move out of apt into NH.  Stressful and overwhelming but not as bad as he worried it would be.   Working on rennovation of their place in [place].   Seems like could use some more counseling.  Gets too overwhelmed.  Over worry with minor stressors.  Will be in [place] until about July.   No SE. No sedation from quetiapine.  Some incr waking in middle of night from time to time.  Still effective overall.  Avg 8-9 hours sleep. No mania.  No mood swings.  Some diff with motivation.  Went through period of down on himself but coming out of it.   Tremor not problem unless more than 2 cups coffee.    Past Psychiatric Medication Trials: Propranolol 20 AM Lithium 600 mg max dose tolerated DT tremor Seroquel 300 Wellbutrin 300 Ritalin 10 mg TID Concerta 36 too much  Review of Systems:  Review of Systems  Respiratory:  Negative for shortness of breath.   Cardiovascular:  Negative for chest pain and palpitations.  Neurological:  Negative for tremors.       No falls  Psychiatric/Behavioral:  Negative for agitation, behavioral problems, confusion, decreased concentration, dysphoric mood, hallucinations, self-injury, sleep disturbance and suicidal ideas. The patient is not nervous/anxious and is not hyperactive.   tremor variable worse with caffeine  Medications: I have reviewed the patient's current medications.  Current Outpatient Medications  Medication  Sig Dispense Refill   Acetylcysteine (NAC 600 PO) Take 1,200 mg by mouth daily.     aspirin EC 81 MG tablet Take 81 mg by mouth daily.     Carnitine-B5-B6 (L-CARNITINE 500 PO) Take 1,000 mg by mouth daily.     finasteride (PROSCAR) 5 MG tablet Take 1 tablet by mouth daily 90 tablet 3   lithium carbonate 300 MG capsule TAKE 1 CAPSULE BY MOUTH 2 TIMES A DAY 60 capsule 0   methylphenidate (CONCERTA) 27 MG PO CR tablet Take 1  tablet (27 mg total) by mouth every morning. 30 tablet 0   methylphenidate 27 MG PO CR tablet Take 1 tablet (27 mg total) by mouth every morning. 30 tablet 0   methylphenidate 27 MG PO CR tablet Take 1 tablet (27 mg total) by mouth every morning. 60 tablet 0   METHYLPHENIDATE 27 MG PO CR tablet TAKE ONE TABLET BY MOUTH EVERY MORNING 30 tablet 0   propranolol (INDERAL) 10 MG tablet Take 2-4 tablets by mouth twice daily as needed for tremor (Patient taking differently: 1 daily) 540 tablet 0   Pyridoxine HCl (VITAMIN B6) 100 MG TABS Take 100 mg by mouth daily.     QUEtiapine (SEROQUEL) 300 MG tablet TAKE ONE TABLET BY MOUTH EVERY NIGHT AT BEDTIME 30 tablet 1   sildenafil (VIAGRA) 100 MG tablet TAKE 1 TABLET BY MOUTH AS NEEDED AS DIRECTED (Patient not taking: Reported on 12/23/2020) 30 tablet 11   Vitamin D, Ergocalciferol, (DRISDOL) 1.25 MG (50000 UNIT) CAPS capsule Take 1 capsule (50,000 Units total) by mouth every 7 (seven) days. 15 capsule 1   No current facility-administered medications for this visit.    Medication Side Effect:  CO sluggish in the morning. Tremor returned.  Allergies:  Allergies  Allergen Reactions   Lincomycin Hcl     As a child/had a hive    Past Medical History:  Diagnosis Date   Attention or concentration deficit    Bipolar disorder    BPH (benign prostatic hyperplasia)    Diverticular disease    [facility]/per pt not aware of this   Lithium-induced tremor    Varicose veins    Wears glasses     Family History  Problem Relation Age of  Onset   Depression Mother    Prostate cancer Father    Kidney disease Father    Dementia Father        unspecified type; sympton onset in late 80s/early 63s   Tremor Father    Parkinson's disease Sister     Social History   Socioeconomic History   Marital status: Married    Spouse name: Not on file   Number of children: Not on file   Years of education: 17   Highest education level: Bachelor's degree (e.g., BA, AB, BS)  Occupational History   Not on file  Tobacco Use   Smoking status: Never   Smokeless tobacco: Never  Vaping Use   Vaping status: Never Used  Substance and Sexual Activity   Alcohol use: Not Currently    Alcohol/week: 7.0 standard drinks of alcohol    Types: 7 Cans of beer per week    Comment: 1 beer nightly   Drug use: Not Currently   Sexual activity: Not on file  Other Topics Concern   Not on file  Social History Narrative   Not on file   Social Drivers of Health   Financial Resource Strain: Not on file  Food Insecurity: Not on file  Transportation Needs: Not on file  Physical Activity: Not on file  Stress: Not on file  Social Connections: Not on file  Intimate Partner Violence: Not on file    Past Medical History, Surgical history, Social history, and Family history were reviewed and updated as appropriate.   Please see review of systems for further details on the patient's review from today.   Objective:   Physical Exam:  There were no vitals taken for this visit.  Physical Exam Constitutional:      General:  He is not in acute distress. Musculoskeletal:        General: No deformity.  Neurological:     Mental Status: He is alert and oriented to person, place, and time.     Cranial Nerves: No dysarthria.     Motor: Tremor present.     Coordination: Coordination normal.  Psychiatric:        Attention and Perception: Attention and perception normal. He does not perceive auditory or visual hallucinations.        Mood and Affect: Mood  is anxious. Mood is not depressed. Affect is not labile, blunt, angry or inappropriate.        Speech: Speech normal. Speech is not rapid and pressured.        Behavior: Behavior normal. Behavior is not slowed or hyperactive. Behavior is cooperative.        Thought Content: Thought content normal. Thought content is not paranoid or delusional. Thought content does not include homicidal or suicidal ideation. Thought content does not include suicidal plan.        Cognition and Memory: Cognition and memory normal.        Judgment: Judgment normal.     Comments: Insight fair.  Perhaps mild depression not bad but easily stressed and overwhelmed     Lab Review:     Component Value Date/Time   NA 140 07/30/2022 1537   NA 139 08/31/2019 1631   K 4.3 07/30/2022 1537   CL 106 07/30/2022 1537   CO2 24 07/30/2022 1537   GLUCOSE 93 07/30/2022 1537   BUN 19 07/30/2022 1537   BUN 28 (H) 08/31/2019 1631   CREATININE 1.15 07/30/2022 1537   CALCIUM 9.8 07/30/2022 1537   GFRNONAA 75 08/31/2019 1631   GFRAA 86 08/31/2019 1631       Component Value Date/Time   WBC 6.4 04/26/2013 0930   RBC 4.69 04/26/2013 0930   HGB 14.6 04/26/2013 0930   HCT 42.3 04/26/2013 0930   PLT 266 04/26/2013 0930   MCV 90.2 04/26/2013 0930   MCH 31.1 04/26/2013 0930   MCHC 34.5 04/26/2013 0930   RDW 13.4 04/26/2013 0930   LYMPHSABS 1.2 04/26/2013 0930   MONOABS 0.7 04/26/2013 0930   EOSABS 0.3 04/26/2013 0930   BASOSABS 0.0 04/26/2013 0930    Lithium Lvl  Date Value Ref Range Status  07/30/2022 0.5 (L) 0.6 - 1.2 mmol/L Final    No results found for: "PHENYTOIN", "PHENOBARB", "VALPROATE", "CBMZ"   .res Assessment: Plan:    No diagnosis found.   Mr. 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  Since this concentration problem was serious enough to be affecting his job function and potentially forcing retirement I offered him the option of treating it with stimulants.   He has retired now but still needs the stimulant to help with concentration and focus, motivation and productivity at home.  Retirement a benefit to mental health. Disc importance of staying active to manage risk of depression.    Previously discussed the neurology and neuropsych testing with the patient and his wife.  It is unlikely that psych meds are causing cognitive impairment.  It is possible that they have been causing or contributing to tremor.    reduced the Concerta to 27 mg daily to minimize  tremor and reduce manic risk. Discussed potential benefits, risks, and side effects of stimulants with patient to include increased heart rate, palpitations, insomnia, increased anxiety, increased irritability, or decreased appetite.  Instructed patient to contact office if experiencing any significant tolerability issues.   The main benefit is mood, energy, alertness.  Still some conc and motivation px.  Concerta 27 mg AM.    Tremor related to medications have recurred and he saw neurologist and disc that and neuropsych testing.  Tremor is much better Propranolol for tremor increase to 20- 40 mg BID prn .  He reports it helps the tremor. Disc SE in detail.  Continue lithium 600 mg daily and  Cont quetiapine to 300 mg nightly for bipolar depression.  Discussed potential metabolic side effects associated with atypical antipsychotics, as well as potential risk for movement side effects. Advised pt to contact office if movement side effects occur.   Counseled patient regarding potential benefits, risks, and side effects of lithium to include potential risk of lithium affecting thyroid and renal function.  Discussed need for periodic lab monitoring to determine drug level and to assess for potential adverse  effects.  Counseled patient regarding signs and symptoms of lithium toxicity and advised that they notify office immediately or seek urgent medical attention if experiencing these signs and symptoms.  Patient advised to contact office with any questions or concerns.  Option of  use of propranolol before work to help with the tremor.   Saw Dr. Arbutus Leas for tremor and she also suggested neuropsych testing for memory concerns. Disc results from neuropsych testing and the suggestion about counseling.  Check lithium level and labs and D  His vitamin D level was 36 in past and needs to be repeated and consideration could be given to increasing vitamin D. It was drawn late 2022= 38 Given Rx vit D continue it.  Wants to pursue counseling for better stress management  FU 6 mos  Meredith Staggers, MD, DFAPA   No future appointments.     No orders of the defined types were placed in this encounter.     -------------------------------

## 2023-01-27 ENCOUNTER — Other Ambulatory Visit: Payer: Self-pay

## 2023-01-27 ENCOUNTER — Telehealth: Payer: Self-pay | Admitting: Psychiatry

## 2023-01-27 DIAGNOSIS — F900 Attention-deficit hyperactivity disorder, predominantly inattentive type: Secondary | ICD-10-CM

## 2023-01-27 MED ORDER — METHYLPHENIDATE HCL ER (OSM) 27 MG PO TBCR: 27.0000 mg | EXTENDED_RELEASE_TABLET | ORAL | 0 refills | Status: DC

## 2023-01-27 NOTE — Telephone Encounter (Signed)
Pended methylphenidate 27 mg to [facility].

## 2023-01-27 NOTE — Telephone Encounter (Signed)
Next visit is 07/25/23. Per patient pharmacy needs a new RX for Methylphenidate 27 mg called to:  [facility] [id number] - [place], [place] - [address]   Phone: [phone]  Fax: [phone]

## 2023-02-10 ENCOUNTER — Other Ambulatory Visit: Payer: Self-pay | Admitting: Psychiatry

## 2023-02-10 DIAGNOSIS — F319 Bipolar disorder, unspecified: Secondary | ICD-10-CM

## 2023-02-21 ENCOUNTER — Other Ambulatory Visit: Payer: Self-pay | Admitting: Psychiatry

## 2023-02-21 DIAGNOSIS — F319 Bipolar disorder, unspecified: Secondary | ICD-10-CM

## 2023-02-22 ENCOUNTER — Encounter: Payer: Self-pay | Admitting: Gastroenterology

## 2023-02-22 ENCOUNTER — Other Ambulatory Visit: Payer: Self-pay | Admitting: Psychiatry

## 2023-02-22 ENCOUNTER — Other Ambulatory Visit: Payer: Self-pay

## 2023-02-22 ENCOUNTER — Telehealth: Payer: Self-pay | Admitting: Psychiatry

## 2023-02-22 DIAGNOSIS — Z1321 Encounter for screening for nutritional disorder: Secondary | ICD-10-CM

## 2023-02-22 DIAGNOSIS — F900 Attention-deficit hyperactivity disorder, predominantly inattentive type: Secondary | ICD-10-CM

## 2023-02-22 DIAGNOSIS — E559 Vitamin D deficiency, unspecified: Secondary | ICD-10-CM

## 2023-02-22 DIAGNOSIS — R7989 Other specified abnormal findings of blood chemistry: Secondary | ICD-10-CM

## 2023-02-22 MED ORDER — METHYLPHENIDATE HCL ER (OSM) 27 MG PO TBCR
27.0000 mg | EXTENDED_RELEASE_TABLET | Freq: Every morning | ORAL | 0 refills | Status: DC
Start: 2023-02-22 — End: 2023-04-01

## 2023-02-22 MED ORDER — METHYLPHENIDATE HCL ER (OSM) 27 MG PO TBCR: 27.0000 mg | EXTENDED_RELEASE_TABLET | ORAL | 0 refills | Status: DC

## 2023-02-22 NOTE — Telephone Encounter (Signed)
Patient lvm requesting a refill on the Methylphenidate ER 27 mg tab. Fill at the [facility] location per patient.  Last seen 01/25/23, follow up 07/25/23

## 2023-02-22 NOTE — Telephone Encounter (Signed)
Pended 3 rf to [facility] pharm

## 2023-03-08 ENCOUNTER — Ambulatory Visit (AMBULATORY_SURGERY_CENTER): Payer: Medicare Other

## 2023-03-08 VITALS — Ht 69.0 in | Wt 188.0 lb

## 2023-03-08 DIAGNOSIS — Z860100 Personal history of colon polyps, unspecified: Secondary | ICD-10-CM

## 2023-03-08 MED ORDER — SUFLAVE 178.7 G PO SOLR
1.0000 | ORAL | 0 refills | Status: DC
Start: 2023-03-08 — End: 2023-03-22

## 2023-03-08 NOTE — Progress Notes (Signed)
 No egg or soy allergy known to patient  No issues known to pt with past sedation with any surgeries or procedures Patient denies ever being told they had issues or difficulty with intubation  No FH of Malignant Hyperthermia Pt is not on diet pills Pt is not on  home 02  Pt is not on blood thinners  Pt denies issues with constipation  No A fib or A flutter Have any cardiac testing pending-- Cardiac consult SOB with hard exertion no testing  LOA: independent  Prep: suflave   Patient's chart reviewed by Cathlyn Parsons CNRA prior to previsit and patient appropriate for the [facility].  Previsit completed and red dot placed by patient's name on their procedure day (on provider's schedule).     PV completed with patient. Prep instructions sent via mychart and home address.

## 2023-03-14 ENCOUNTER — Ambulatory Visit: Payer: Medicare Other | Admitting: Mental Health

## 2023-03-14 DIAGNOSIS — F900 Attention-deficit hyperactivity disorder, predominantly inattentive type: Secondary | ICD-10-CM

## 2023-03-14 NOTE — Progress Notes (Signed)
 [facility] Initial Adult Exam  Name: Brian Guerrero Date: 03/14/2023 MRN: [id number] DOB: Jun 12, 1950 PCP: Tally Joe, MD  Time spent: 50 minutes  Reason for Visit Loman Chroman Problem: Merlyn Albert has been feeling overwhelmed, he and his wife plan to move to [address], [place] to live in his parents house who have passed.  He is having to go through many items at home and decide what to keep or let go of. They plan to move in July of this year. Feels like a "mountain" to get past in order get ready to make the move. He stated his wife is a "take charge person" and he does not like being told what to do and his wife has a tendency to direct him.  He reports they have had some marital strain over the past several years, he would like it if she enjoyed his sense of humor, feels they have some degree of incompatibility.  They have been married for 50 years. 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 He wants to be more disciplined to engage in interests, to play guitar, maybe play golf or other activities. He identifies some worries, such as after the move, "How are people going to receive me?" He has been in care with Dr. Haywood Lasso for med management.  Recommended he continue and return to therapy in 2 weeks.  Mental Status Exam:    Appearance:    Casual     Behavior:   Appropriate  Motor:   WNL  Speech/Language:    Clear and Coherent  Affect:   Full range   Mood:   Anxious, pleasant  Thought process:   Logical, linear, goal directed  Thought content:     WNL  Sensory/Perceptual disturbances:     none  Orientation:   x4  Attention:   Good  Concentration:   Good  Memory:   Intact  Fund of knowledge:    Consistent with age and development  Insight:     Good  Judgment:    Good  Impulse Control:   Good   Reported Symptoms:  anxiety    Risk Assessment: Danger to Self:  No Self-injurious Behavior: No Danger to Others: No Duty to Warn:no Physical  Aggression / Violence:No  Access to Firearms a concern: No  Gang Involvement:No  Patient / guardian was educated about steps to take if suicide or homicide risk level increases between visits: yes While future psychiatric events cannot be accurately predicted, the patient does not currently require acute inpatient psychiatric care and does not currently meet [facility] involuntary commitment criteria.   Medical History/Surgical History: Past Medical History:  Diagnosis Date   Attention or concentration deficit    Bipolar disorder    BPH (benign prostatic hyperplasia)    Diverticular disease    [facility]/per pt not aware of this   Lithium-induced tremor    Varicose veins    Wears glasses     Past Surgical History:  Procedure Laterality Date   CATARACT EXTRACTION, BILATERAL     COLONOSCOPY     INGUINAL HERNIA REPAIR Right 04/30/2013   Procedure: HERNIA REPAIR INGUINAL ADULT;  Surgeon: Emelia Loron, MD;  Location:  SURGERY CENTER;  Service: General;  Laterality: Right;   WISDOM TOOTH EXTRACTION      Medications: Current Outpatient Medications  Medication Sig Dispense Refill   Acetylcysteine (NAC 600 PO) Take 1,200 mg by mouth daily.     aspirin EC  81 MG tablet Take 81 mg by mouth daily. (Patient not taking: Reported on 03/08/2023)     BOOSTRIX 5-2.5-18.5 LF-MCG/0.5 injection      Carnitine-B5-B6 (L-CARNITINE 500 PO) Take 1,000 mg by mouth daily. (Patient not taking: Reported on 03/08/2023)     finasteride (PROSCAR) 5 MG tablet Take 1 tablet by mouth daily (Patient not taking: Reported on 03/08/2023) 90 tablet 3   ibuprofen (ADVIL) 800 MG tablet Take 800 mg by mouth.     lithium carbonate 300 MG capsule Take 2 capsules (600 mg total) by mouth daily. 180 capsule 0   methylphenidate (CONCERTA) 27 MG PO CR tablet Take 1 tablet (27 mg total) by mouth every morning. 30 tablet 0   [START ON 04/19/2023] methylphenidate 27 MG PO CR tablet Take 1 tablet (27 mg total) by mouth  every morning. 30 tablet 0   [START ON 03/22/2023] methylphenidate 27 MG PO CR tablet Take 1 tablet (27 mg total) by mouth every morning. 30 tablet 0   methylphenidate 27 MG PO CR tablet Take 1 tablet (27 mg total) by mouth every morning. 30 tablet 0   PEG 3350-KCl-NaCl-NaSulf-MgSul (SUFLAVE) 178.7 g SOLR Take 1 kit by mouth as directed. 1 each 0   propranolol (INDERAL) 10 MG tablet Take 2-4 tablets by mouth twice daily as needed for tremor (Patient not taking: Reported on 03/08/2023) 540 tablet 0   Pyridoxine HCl (VITAMIN B6) 100 MG TABS Take 500 mg by mouth daily.     QUEtiapine (SEROQUEL) 300 MG tablet TAKE 1 TABLET BY MOUTH EVERY NIGHT AT BEDTIME 90 tablet 1   sildenafil (VIAGRA) 100 MG tablet TAKE 1 TABLET BY MOUTH AS NEEDED AS DIRECTED (Patient not taking: Reported on 03/08/2023) 30 tablet 11   TENIVAC 5-2 LFU injection      Vitamin D, Ergocalciferol, (DRISDOL) 1.25 MG (50000 UNIT) CAPS capsule TAKE 1 CAPSULE BY MOUTH ONCE WEEKLY 15 capsule 1   No current facility-administered medications for this visit.    Allergies  Allergen Reactions   Lincomycin Hcl     As a child/had a hive    Diagnoses:    ICD-10-CM   1. Attention deficit hyperactivity disorder (ADHD), predominantly inattentive type  F90.0     By history  Plan of Care: TBD   Waldron Session, [facility]

## 2023-03-16 ENCOUNTER — Encounter: Payer: Self-pay | Admitting: Gastroenterology

## 2023-03-22 ENCOUNTER — Ambulatory Visit: Payer: Medicare HMO | Admitting: Gastroenterology

## 2023-03-22 ENCOUNTER — Encounter: Payer: Self-pay | Admitting: Gastroenterology

## 2023-03-22 VITALS — BP 117/72 | HR 64 | Temp 98.4°F | Resp 12 | Ht 69.0 in | Wt 180.6 lb

## 2023-03-22 DIAGNOSIS — K644 Residual hemorrhoidal skin tags: Secondary | ICD-10-CM | POA: Diagnosis not present

## 2023-03-22 DIAGNOSIS — K5730 Diverticulosis of large intestine without perforation or abscess without bleeding: Secondary | ICD-10-CM | POA: Diagnosis not present

## 2023-03-22 DIAGNOSIS — Z860100 Personal history of colon polyps, unspecified: Secondary | ICD-10-CM

## 2023-03-22 DIAGNOSIS — Z860101 Personal history of adenomatous and serrated colon polyps: Secondary | ICD-10-CM | POA: Diagnosis not present

## 2023-03-22 DIAGNOSIS — D120 Benign neoplasm of cecum: Secondary | ICD-10-CM

## 2023-03-22 DIAGNOSIS — K648 Other hemorrhoids: Secondary | ICD-10-CM | POA: Diagnosis not present

## 2023-03-22 DIAGNOSIS — D122 Benign neoplasm of ascending colon: Secondary | ICD-10-CM | POA: Diagnosis not present

## 2023-03-22 DIAGNOSIS — Z1211 Encounter for screening for malignant neoplasm of colon: Secondary | ICD-10-CM

## 2023-03-22 MED ORDER — SODIUM CHLORIDE 0.9 % IV SOLN: 500.0000 mL | Freq: Once | INTRAVENOUS | Status: AC

## 2023-03-22 NOTE — Progress Notes (Signed)
 Called to room to assist during endoscopic procedure.  Patient ID and intended procedure confirmed with present staff. Received instructions for my participation in the procedure from the performing physician.

## 2023-03-22 NOTE — Progress Notes (Unsigned)
 Pt's states no medical or surgical changes since previsit or office visit.

## 2023-03-22 NOTE — Patient Instructions (Signed)
 Discharge instructions given. Handouts on polyps,Diverticulosis and Hemorrhoids. Resume previous medications. Recall place for 6 months per Dr. Lavon Paganini. YOU HAD AN ENDOSCOPIC PROCEDURE TODAY AT THE [facility]:   Refer to the procedure report that was given to you for any specific questions about what was found during the examination.  If the procedure report does not answer your questions, please call your gastroenterologist to clarify.  If you requested that your care partner not be given the details of your procedure findings, then the procedure report has been included in a sealed envelope for you to review at your convenience later.  YOU SHOULD EXPECT: Some feelings of bloating in the abdomen. Passage of more gas than usual.  Walking can help get rid of the air that was put into your GI tract during the procedure and reduce the bloating. If you had a lower endoscopy (such as a colonoscopy or flexible sigmoidoscopy) you may notice spotting of blood in your stool or on the toilet paper. If you underwent a bowel prep for your procedure, you may not have a normal bowel movement for a few days.  Please Note:  You might notice some irritation and congestion in your nose or some drainage.  This is from the oxygen used during your procedure.  There is no need for concern and it should clear up in a day or so.  SYMPTOMS TO REPORT IMMEDIATELY:  Following lower endoscopy (colonoscopy or flexible sigmoidoscopy):  Excessive amounts of blood in the stool  Significant tenderness or worsening of abdominal pains  Swelling of the abdomen that is new, acute  Fever of 100F or higher   For urgent or emergent issues, a gastroenterologist can be reached at any hour by calling [phone]. Do not use MyChart messaging for urgent concerns.    DIET:  We do recommend a small meal at first, but then you may proceed to your regular diet.  Drink plenty of fluids but you should avoid alcoholic  beverages for 24 hours.  ACTIVITY:  You should plan to take it easy for the rest of today and you should NOT DRIVE or use heavy machinery until tomorrow (because of the sedation medicines used during the test).    FOLLOW UP: Our staff will call the number listed on your records the next business day following your procedure.  We will call around 7:15- 8:00 am to check on you and address any questions or concerns that you may have regarding the information given to you following your procedure. If we do not reach you, we will leave a message.     If any biopsies were taken you will be contacted by phone or by letter within the next 1-3 weeks.  Please call us at [phone] if you have not heard about the biopsies in 3 weeks.    SIGNATURES/CONFIDENTIALITY: You and/or your care partner have signed paperwork which will be entered into your electronic medical record.  These signatures attest to the fact that that the information above on your After Visit Summary has been reviewed and is understood.  Full responsibility of the confidentiality of this discharge information lies with you and/or your care-partner.

## 2023-03-22 NOTE — Progress Notes (Unsigned)
 [facility] History and Physical   Primary Care Physician:  Tally Joe, MD   Reason for Procedure:  History of adenomatous colon polyps  Plan:    Surveillance colonoscopy with possible interventions as needed     HPI: Brian Guerrero is a very pleasant 73 y.o. male here for surveillance colonoscopy. Denies any nausea, vomiting, abdominal pain, melena or bright red blood per rectum  The risks and benefits as well as alternatives of endoscopic procedure(s) have been discussed and reviewed. All questions answered. The patient agrees to proceed.    Past Medical History:  Diagnosis Date   Attention or concentration deficit    Bipolar disorder    BPH (benign prostatic hyperplasia)    Diverticular disease    [facility]/per pt not aware of this   Lithium-induced tremor    Varicose veins    Wears glasses     Past Surgical History:  Procedure Laterality Date   CATARACT EXTRACTION, BILATERAL     COLONOSCOPY     INGUINAL HERNIA REPAIR Right 04/30/2013   Procedure: HERNIA REPAIR INGUINAL ADULT;  Surgeon: Emelia Loron, MD;  Location: [facility];  Service: General;  Laterality: Right;   WISDOM TOOTH EXTRACTION      Prior to Admission medications   Medication Sig Start Date End Date Taking? Authorizing Provider  Acetylcysteine (NAC 600 PO) Take 1,200 mg by mouth daily.   Yes [provider]  lithium carbonate 300 MG capsule Take 2 capsules (600 mg total) by mouth daily. 02/21/23  Yes Cottle, Steva Ready., MD  methylphenidate 27 MG PO CR tablet Take 1 tablet (27 mg total) by mouth every morning. 02/22/23  Yes Cottle, Steva Ready., MD  propranolol (INDERAL) 10 MG tablet Take 2-4 tablets by mouth twice daily as needed for tremor 11/21/20  Yes Cottle, Steva Ready., MD  Pyridoxine HCl (VITAMIN B6) 100 MG TABS Take 500 mg by mouth daily.   Yes [provider]  QUEtiapine (SEROQUEL) 300 MG tablet TAKE 1 TABLET BY MOUTH EVERY NIGHT AT  BEDTIME 02/10/23  Yes Cottle, Steva Ready., MD  Vitamin D, Ergocalciferol, (DRISDOL) 1.25 MG (50000 UNIT) CAPS capsule TAKE 1 CAPSULE BY MOUTH ONCE WEEKLY 02/22/23  Yes Cottle, Steva Ready., MD  Carnitine-B5-B6 (L-CARNITINE 500 PO) Take 1,000 mg by mouth daily. Patient not taking: Reported on 03/08/2023    [provider]  ibuprofen (ADVIL) 800 MG tablet Take 800 mg by mouth. 12/17/22   [provider]  sildenafil (VIAGRA) 100 MG tablet TAKE 1 TABLET BY MOUTH AS NEEDED AS DIRECTED Patient not taking: Reported on 12/23/2020 03/26/20 03/26/21  Marcine Matar, MD    Current Outpatient Medications  Medication Sig Dispense Refill   Acetylcysteine (NAC 600 PO) Take 1,200 mg by mouth daily.     lithium carbonate 300 MG capsule Take 2 capsules (600 mg total) by mouth daily. 180 capsule 0   methylphenidate 27 MG PO CR tablet Take 1 tablet (27 mg total) by mouth every morning. 30 tablet 0   propranolol (INDERAL) 10 MG tablet Take 2-4 tablets by mouth twice daily as needed for tremor 540 tablet 0   Pyridoxine HCl (VITAMIN B6) 100 MG TABS Take 500 mg by mouth daily.     QUEtiapine (SEROQUEL) 300 MG tablet TAKE 1 TABLET BY MOUTH EVERY NIGHT AT BEDTIME 90 tablet 1   Vitamin D, Ergocalciferol, (DRISDOL) 1.25 MG (50000 UNIT) CAPS capsule TAKE 1 CAPSULE BY MOUTH ONCE WEEKLY 15 capsule 1  Carnitine-B5-B6 (L-CARNITINE 500 PO) Take 1,000 mg by mouth daily. (Patient not taking: Reported on 03/08/2023)     ibuprofen (ADVIL) 800 MG tablet Take 800 mg by mouth.     sildenafil (VIAGRA) 100 MG tablet TAKE 1 TABLET BY MOUTH AS NEEDED AS DIRECTED (Patient not taking: Reported on 12/23/2020) 30 tablet 11   Current Facility-Administered Medications  Medication Dose Route Frequency Provider Last Rate Last Admin   0.9 %  sodium chloride infusion  500 mL Intravenous Once Napoleon Form, MD        Allergies as of 03/22/2023 - Review Complete 03/22/2023  Allergen Reaction Noted   Lincomycin hcl   03/30/2013    Family History  Problem Relation Age of Onset   Depression Mother    Prostate cancer Father    Kidney disease Father    Dementia Father        unspecified type; sympton onset in late 80s/early 25s   Tremor Father    Parkinson's disease Sister    Colon cancer Neg Hx    Rectal cancer Neg Hx    Stomach cancer Neg Hx    Esophageal cancer Neg Hx     Social History   Socioeconomic History   Marital status: Married    Spouse name: Not on file   Number of children: Not on file   Years of education: 17   Highest education level: Bachelor's degree (e.g., BA, AB, BS)  Occupational History   Not on file  Tobacco Use   Smoking status: Never   Smokeless tobacco: Never  Vaping Use   Vaping status: Never Used  Substance and Sexual Activity   Alcohol use: Not Currently    Alcohol/week: 7.0 standard drinks of alcohol    Types: 7 Cans of beer per week    Comment: 1 beer nightly   Drug use: Never   Sexual activity: Yes  Other Topics Concern   Not on file  Social History Narrative   Not on file   Social Drivers of Health   Financial Resource Strain: Not on file  Food Insecurity: Not on file  Transportation Needs: Not on file  Physical Activity: Not on file  Stress: Not on file  Social Connections: Not on file  Intimate Partner Violence: Not on file    Review of Systems:  All other review of systems negative except as mentioned in the HPI.  Physical Exam: Vital signs in last 24 hours: BP 138/84   Pulse 68   Temp 98.4 F (36.9 C) (Temporal)   Ht 5\' 9"  (1.753 m)   Wt 180 lb 9.6 oz (81.9 kg)   SpO2 97%   BMI 26.67 kg/m  General:   Alert, NAD Lungs:  Clear .   Heart:  Regular rate and rhythm Abdomen:  Soft, nontender and nondistended. Neuro/Psych:  Alert and cooperative. Normal mood and affect. A and O x 3  Reviewed labs, radiology imaging, old records and pertinent past GI work up  Patient is appropriate for planned procedure(s) and anesthesia in an  ambulatory setting   K. Scherry Ran , MD [phone]

## 2023-03-22 NOTE — Op Note (Signed)
 [facility] Patient Name: Meagan Spease Procedure Date: 03/22/2023 3:59 PM MRN: [id number] Endoscopist: Napoleon Form , MD, [id number] Age: 73 Referring MD:  Date of Birth: [birth year] Gender: Male Account #: [id number] Procedure:                Colonoscopy Indications:              High risk colon cancer surveillance: Personal                            history of colonic polyps Medicines:                Monitored Anesthesia Care Procedure:                Pre-Anesthesia Assessment:                           - Prior to the procedure, a History and Physical                            was performed, and patient medications and                            allergies were reviewed. The patient's tolerance of                            previous anesthesia was also reviewed. The risks                            and benefits of the procedure and the sedation                            options and risks were discussed with the patient.                            All questions were answered, and informed consent                            was obtained. Prior Anticoagulants: The patient has                            taken no anticoagulant or antiplatelet agents. ASA                            Grade Assessment: I - A normal, healthy patient.                            After reviewing the risks and benefits, the patient                            was deemed in satisfactory condition to undergo the                            procedure.  After obtaining informed consent, the colonoscope                            was passed under direct vision. Throughout the                            procedure, the patient's blood pressure, pulse, and                            oxygen saturations were monitored continuously. The                            [id number]-HQ190L Colonoscope [id number] was introduced                            through the anus and advanced to the the  cecum,                            identified by appendiceal orifice and ileocecal                            valve. The colonoscopy was performed without                            difficulty. The patient tolerated the procedure                            well. The quality of the bowel preparation was                            inadequate. The ileocecal valve, appendiceal                            orifice, and rectum were photographed. Scope In: 4:04:30 PM Scope Out: 4:19:30 PM Scope Withdrawal Time: 0 hours 10 minutes 32 seconds  Total Procedure Duration: 0 hours 15 minutes 0 seconds  Findings:                 The perianal and digital rectal examinations were                            normal.                           A 2 mm polyp was found in the cecum. The polyp was                            sessile. The polyp was removed with a cold biopsy                            forceps. Resection and retrieval were complete.                           A 4 mm polyp was found in the ascending colon. The  polyp was sessile. The polyp was removed with a                            cold snare. Resection and retrieval were complete.                           Scattered small-mouthed diverticula were found in                            the sigmoid colon and descending colon.                           Non-bleeding external and internal hemorrhoids were                            found during retroflexion. The hemorrhoids were                            small. Complications:            No immediate complications. Estimated Blood Loss:     Estimated blood loss was minimal. Impression:               - Preparation of the colon was inadequate.                           - One 2 mm polyp in the cecum, removed with a cold                            biopsy forceps. Resected and retrieved.                           - One 4 mm polyp in the ascending colon, removed                             with a cold snare. Resected and retrieved.                           - Diverticulosis in the sigmoid colon and in the                            descending colon.                           - Non-bleeding external and internal hemorrhoids. Recommendation:           - Patient has a contact number available for                            emergencies. The signs and symptoms of potential                            delayed complications were discussed with the  patient. Return to normal activities tomorrow.                            Written discharge instructions were provided to the                            patient.                           - Resume previous diet.                           - Continue present medications.                           - Await pathology results.                           - Repeat colonoscopy at the next available                            appointment because the bowel preparation was                            suboptimal. Napoleon Form, MD 03/22/2023 4:26:11 PM This report has been signed electronically.

## 2023-03-22 NOTE — Progress Notes (Unsigned)
 A/o x 3, VSS, gd SR's, pleased with anesthesia, report to RN

## 2023-03-23 ENCOUNTER — Telehealth: Payer: Self-pay | Admitting: *Deleted

## 2023-03-23 NOTE — Telephone Encounter (Signed)
  Follow up Call-     03/22/2023    3:08 PM  Call back number  Post procedure Call Back phone  # [phone]  Permission to leave phone message Yes     Patient questions:  Do you have a fever, pain , or abdominal swelling? No. Pain Score  0 *  Have you tolerated food without any problems? Yes.    Have you been able to return to your normal activities? Yes.    Do you have any questions about your discharge instructions: Diet   No. Medications  No. Follow up visit  No.  Do you have questions or concerns about your Care? No.  Actions: * If pain score is 4 or above: No action needed, pain <4.

## 2023-03-25 LAB — SURGICAL PATHOLOGY

## 2023-03-31 ENCOUNTER — Ambulatory Visit: Admitting: Mental Health

## 2023-03-31 DIAGNOSIS — F900 Attention-deficit hyperactivity disorder, predominantly inattentive type: Secondary | ICD-10-CM | POA: Diagnosis not present
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
## 2023-04-01 ENCOUNTER — Other Ambulatory Visit: Payer: Self-pay

## 2023-04-01 ENCOUNTER — Telehealth: Payer: Self-pay | Admitting: Psychiatry

## 2023-04-01 DIAGNOSIS — F900 Attention-deficit hyperactivity disorder, predominantly inattentive type: Secondary | ICD-10-CM

## 2023-04-01 MED ORDER — METHYLPHENIDATE HCL ER (OSM) 27 MG PO TBCR: 27.0000 mg | EXTENDED_RELEASE_TABLET | Freq: Every morning | ORAL | 0 refills | Status: DC

## 2023-04-01 NOTE — Telephone Encounter (Signed)
 Pt called asking for a refill on his methylphenidate 27 mgcr. Pharmacy is [facility] [address]

## 2023-04-01 NOTE — Telephone Encounter (Signed)
 Pended 27 mg Concerta to HT

## 2023-04-12 ENCOUNTER — Ambulatory Visit (INDEPENDENT_AMBULATORY_CARE_PROVIDER_SITE_OTHER): Admitting: Mental Health

## 2023-04-12 DIAGNOSIS — F900 Attention-deficit hyperactivity disorder, predominantly inattentive type: Secondary | ICD-10-CM

## 2023-04-12 DIAGNOSIS — F411 Generalized anxiety disorder: Secondary | ICD-10-CM | POA: Diagnosis not present

## 2023-04-12 NOTE — Progress Notes (Signed)
 [facility] Psych[facility] Note  Name: Brian Guerrero Date: 04/12/2023 MRN: [id number] DOB: July 26, 1950 PCP: Tally Joe, MD  Time spent: 50 minutes  Treatment:    ind. therapy  Mental Status Exam:    Appearance:    Casual     Behavior:   Appropriate  Motor:   WNL  Speech/Language:    Clear and Coherent  Affect:   Full range   Mood:   Anxious, pleasant  Thought process:   Logical, linear, goal directed  Thought content:     WNL  Sensory/Perceptual disturbances:     none  Orientation:   x4  Attention:   Good  Concentration:   Good  Memory:   Intact  Fund of knowledge:    Consistent with age and development  Insight:     Good  Judgment:    Good  Impulse Control:   Good   Reported Symptoms:  anxiety    Risk Assessment: Danger to Self:  No Self-injurious Behavior: No Danger to Others: No Duty to Warn:no Physical Aggression / Violence:No  Access to Firearms a concern: No  Gang Involvement:No  Patient / guardian was educated about steps to take if suicide or homicide risk level increases between visits: yes While future psychiatric events cannot be accurately predicted, the patient does not currently require acute inpatient psychiatric care and does not currently meet [facility] involuntary commitment criteria.       Subjective: Assessed progress where he shared his progress toward moving to [address] with his wife.  He stated they have considerable amount of packing and preparation prior to the move which will be in a couple of months.  He shared more content related to the dynamic between he and his wife, given an example recently of her being critical when he is driving, his typical responses to not verbalize his thoughts, however, he stated that he spoke up and shared his opinions.  Family history related to his parents, their dynamic and how he communicates in his relationship was explored. He assigned value to his being able to be more assertive,  however, also identified how he typically avoids confrontation.  Outlets where shared, he has been practicing his guitar more recently and has found this helpful to manage stress and anxiety.  Interventions: Motivational interviewing, supportive therapy  Diagnoses:    ICD-10-CM   1. Attention deficit hyperactivity disorder (ADHD), predominantly inattentive type  F90.0     2. Generalized anxiety disorder  F41.1       By history  Plan of Care: Patient to follow through with coping as discussed in session.  Patient to continue to utilize his support system.   Waldron Session, [facility]

## 2023-04-26 ENCOUNTER — Telehealth: Payer: Self-pay

## 2023-04-26 ENCOUNTER — Ambulatory Visit: Admitting: Mental Health

## 2023-04-26 NOTE — Telephone Encounter (Signed)
 Prior Authorization initiated with [employer] medic[employer] part d for Methylphenidate  27 mg ER Pending response

## 2023-04-29 NOTE — Telephone Encounter (Signed)
 Prior approval received effective 04/27/23-04/25/24 for Methylphenidate  ER 27 mg with [facility]

## 2023-04-30 IMAGING — MR MR PROSTATE WO/W CM
12 series · 48 of 48 positions shown · IV contrast (multihance)
Comparison: 02/14/2013

CLINICAL DATA: Elevated PSA of 3.5.

EXAM:
MR PROSTATE WITHOUT AND WITH CONTRAST
TECHNIQUE: Multiplanar multisequence MRI images were obtained of the pelvis
centered about the prostate. Pre and post contrast images were
obtained.
CONTRAST:  15mL MULTIHANCE GADOBENATE DIMEGLUMINE 529 MG/ML IV SOLN

[Series 3: T2 · coronal · 3.0mm · 0.56mm/px · 1 of 23 slices shown (1 of 3)]
[im 1/23]
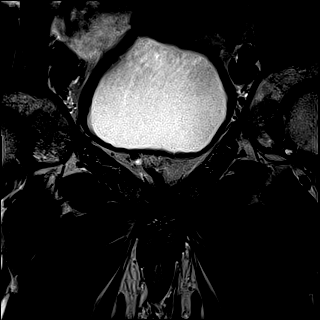

[Series 4: T1 · axial · 5.0mm · 1.25mm/px · 1 of 80 slices shown]
[im 1/80]
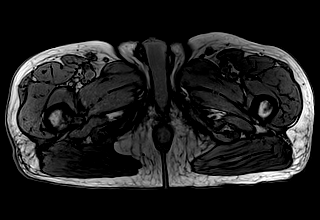

[Series 5: DWI · axial · 3.0mm · 1.75mm/px · z∈[-46,+11]mm · 2 of 60 slices shown (1 of 3)]
[im 1/60]
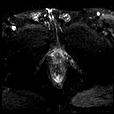
[im 60/60]
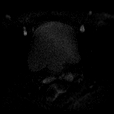

[Series 6: DWI · axial · 3.0mm · 1.75mm/px · 1 of 20 slices shown (2 of 3)]
[im 1/20]
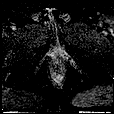

[Series 7: DWI · axial · 3.0mm · 1.75mm/px · 1 of 20 slices shown (3 of 3)]
[im 1/20]
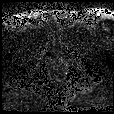

[Series 8: T2 · axial · 3.0mm · 0.56mm/px · 1 of 23 slices shown (2 of 3)]
[im 1/23]
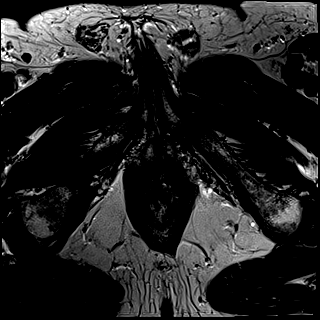

[Series 9: T2 · axial · 1.0mm · 1.04mm/px · z∈[-53,+18]mm · 2 of 72 slices shown (3 of 3)]
[im 1/72]
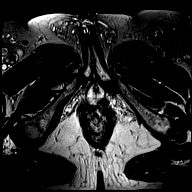
[im 72/72]
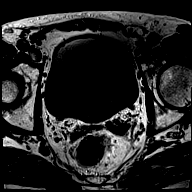

[Series 10: pre t1_twist_tra_dyn · axial · non-contrast · 3.5mm · 0.83mm/px · 1 of 20 slices shown]
[im 1/20]
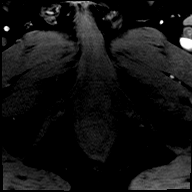

[Series 11: post t1_twist_tra_dyn-copy center · axial · non-contrast · 3.5mm · 0.83mm/px · z∈[-51,+16]mm · 17 of 600 slices shown]
[im 1/600]
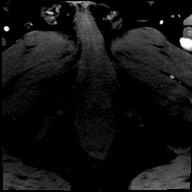
[im 38/600]
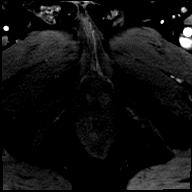
[im 75/600]
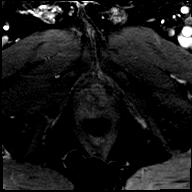
[im 113/600]
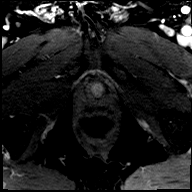
[im 150/600]
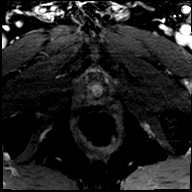
[im 188/600]
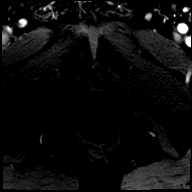
[im 225/600]
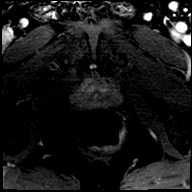
[im 263/600]
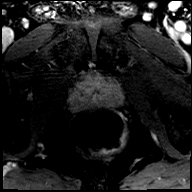
[im 300/600]
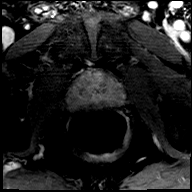
[im 337/600]
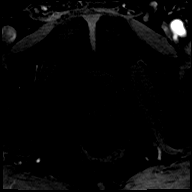
[im 375/600]
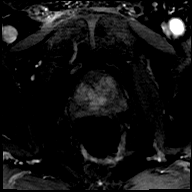
[im 412/600]
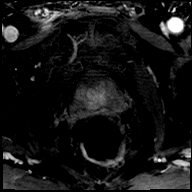
[im 450/600]
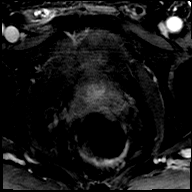
[im 487/600]
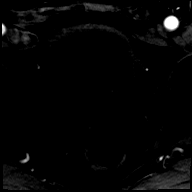
[im 525/600]
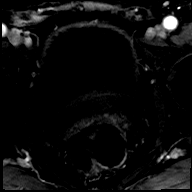
[im 562/600]
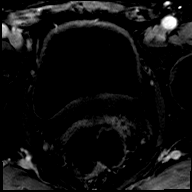
[im 600/600]
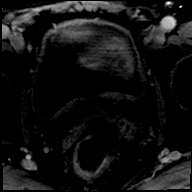

[Series 12: post t1_twist_tra_dyn-copy cent_sub · axial · 3.5mm · 0.83mm/px · z∈[-51,+16]mm · 17 of 580 slices shown]
[im 1/580]
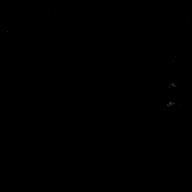
[im 37/580]
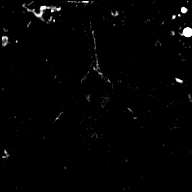
[im 73/580]
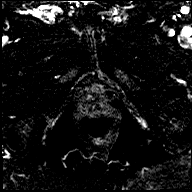
[im 109/580]
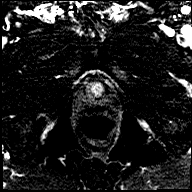
[im 145/580]
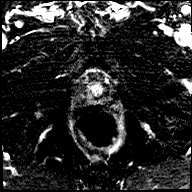
[im 181/580]
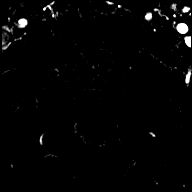
[im 218/580]
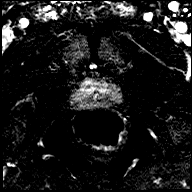
[im 254/580]
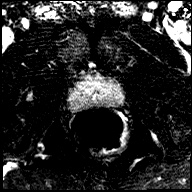
[im 290/580]
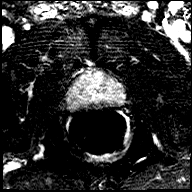
[im 326/580]
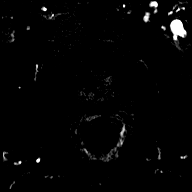
[im 362/580]
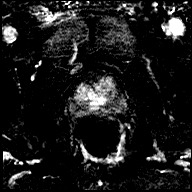
[im 399/580]
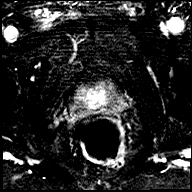
[im 435/580]
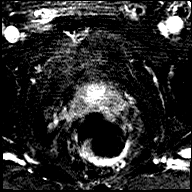
[im 471/580]
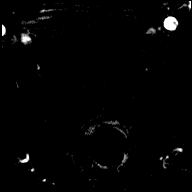
[im 507/580]
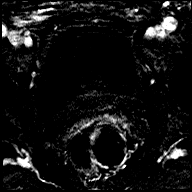
[im 543/580]
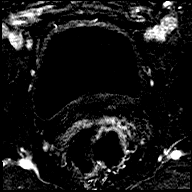
[im 580/580]
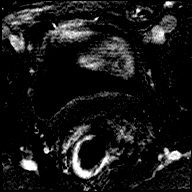

[Series 13: t1_vibe_dixon_tra_f · axial · 2.5mm · 0.91mm/px · z∈[-69,+129]mm · 2 of 80 slices shown]
[im 1/80]
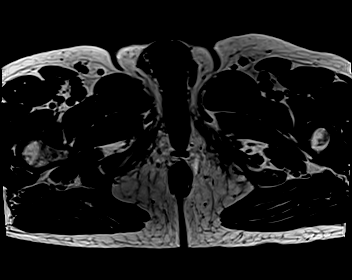
[im 80/80]
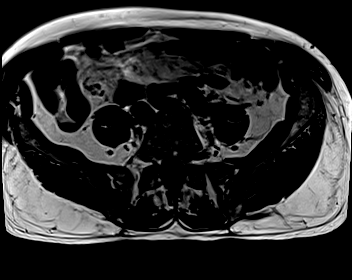

[Series 14: t1_vibe_dixon_tra_w · axial · 2.5mm · 0.91mm/px · z∈[-69,+129]mm · 2 of 80 slices shown]
[im 1/80]
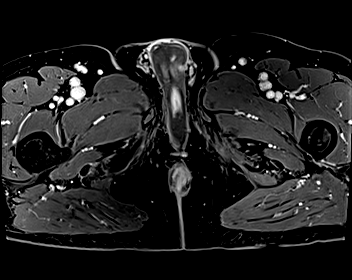
[im 80/80]
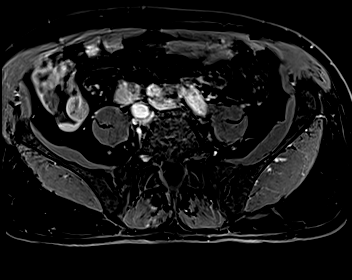

[48 of 48 positions shown; findings below may reference images not displayed]

FINDINGS: Prostate: Demonstrates mild central gland enlargement and
heterogeneity, consistent with benign prostatic hyperplasia. No
dominant central gland nodule.

No areas of peripheral zone masslike T2 hypointensity, restricted
diffusion, or early post-contrast enhancement.

Volume: 4.7 x 4.0 x 4.7 cm (volume = 46 cm^3)

Transcapsular spread:  Absent

Seminal vesicle involvement: Absent

Neurovascular bundle involvement: Absent

Pelvic adenopathy: Absent

Bone metastasis: Absent

Other findings: Atherosclerosis within the iliac vasculature without
aneurysm. Normal urinary bladder. Normal pelvic bowel loops, without
free fluid.
IMPRESSION: No evidence of high-grade or macroscopic prostate carcinoma. No
explanation for elevated PSA.

## 2023-05-05 ENCOUNTER — Telehealth: Payer: Self-pay | Admitting: Gastroenterology

## 2023-05-05 NOTE — Telephone Encounter (Signed)
 This should have been scheduled when he was in the [facility]. It is okay to schedule him.

## 2023-05-05 NOTE — Telephone Encounter (Signed)
 Inbound call from patient, would like his repeat colonoscopy before September, he states he will be moving in the end of July and would like to discuss scheduling prior to.

## 2023-05-06 ENCOUNTER — Ambulatory Visit: Admitting: Mental Health

## 2023-05-06 DIAGNOSIS — F411 Generalized anxiety disorder: Secondary | ICD-10-CM | POA: Diagnosis not present

## 2023-05-06 NOTE — Progress Notes (Unsigned)
 [facility]  Name: Brian Guerrero Date: 05/11/2023 MRN: [id number] DOB: 05/12/1950 PCP: Rae Bugler, MD  Time spent:  48 minutes  Treatment:    ind. therapy  Mental Status Exam:    Appearance:    Casual     Behavior:   Appropriate  Motor:   WNL  Speech/Language:    Clear and Coherent  Affect:   Full range   Mood:   Anxious, pleasant  Thought process:   Logical, linear, goal directed  Thought content:     WNL  Sensory/Perceptual disturbances:     none  Orientation:   x4  Attention:   Good  Concentration:   Good  Memory:   Intact  Fund of knowledge:    Consistent with age and development  Insight:     Good  Judgment:    Good  Impulse Control:   Good   Reported Symptoms:  anxiety    Risk Assessment: Danger to Self:  No Self-injurious Behavior: No Danger to Others: No Duty to Warn:no Physical Aggression / Violence:No  Access to Firearms a concern: No  Gang Involvement:No  Patient / guardian was educated about steps to take if suicide or homicide risk level increases between visits: yes While future psychiatric events cannot be accurately predicted, the patient does not currently require acute inpatient psychiatric care and does not currently meet [place]  involuntary commitment criteria.       Subjective:  Assessed progress where he shared how he and his wife continue to pack for their eventual move which is to occur in July.  He went on to share how they plan on visiting his aunt who is an assisted living.  He stated the trip will be a few hours as it is in [place] .  Went on to share more relational dynamics between he and his wife, her tendency to be critical of him when he is driving, he states this gets frustrating for him, he often tries to avoid arguing but feels he tends to suppress his emotions.  Ways to communicate, asking for her help and how she indicates, prior to the drive was explored.  He stated he will consider  following through with this type of discussion, expresses some doubt how she will receive it, feels that it might be helpful but also it could be something that she might feel is critical of her.  Ways to communicate this effectively utilizing active listening was explored collaboratively.  Working on this communication with his wife connects to his stress, anxiety and he is hopeful that this can improve.  He plans to follow through as discussed.  Interventions: Motivational interviewing, supportive therapy  Diagnoses:    ICD-10-CM   1. Generalized anxiety disorder  F41.1       ADHD By history  Plan of Care: Patient to follow through with coping as discussed in session.  Patient to continue to utilize his support system.   [name], [facility]

## 2023-05-10 ENCOUNTER — Ambulatory Visit: Admitting: Mental Health

## 2023-05-12 ENCOUNTER — Encounter: Payer: Self-pay | Admitting: Gastroenterology

## 2023-05-12 NOTE — Telephone Encounter (Signed)
 Left voicemail for patient to call back to discuss recall colonoscopy.

## 2023-05-12 NOTE — Telephone Encounter (Signed)
 PT would like to speak to Pre Cert in regards to how the insurance will go since he is getting the procedure done early

## 2023-05-14 ENCOUNTER — Other Ambulatory Visit: Payer: Self-pay | Admitting: Psychiatry

## 2023-05-14 DIAGNOSIS — F319 Bipolar disorder, unspecified: Secondary | ICD-10-CM

## 2023-05-16 ENCOUNTER — Encounter: Payer: Self-pay | Admitting: Gastroenterology

## 2023-05-17 ENCOUNTER — Ambulatory Visit: Admitting: Cardiology

## 2023-05-20 ENCOUNTER — Ambulatory Visit: Admitting: Mental Health

## 2023-05-20 DIAGNOSIS — F411 Generalized anxiety disorder: Secondary | ICD-10-CM

## 2023-05-20 NOTE — Progress Notes (Signed)
 [facility] Note  Name: Brian Guerrero Date: 05/20/2023 MRN: [id number] DOB: 02-26-1950 PCP: Rae Bugler, MD  Time spent:  47 minutes  Treatment:    ind. therapy  Mental Status Exam:    Appearance:    Casual     Behavior:   Appropriate  Motor:   WNL  Speech/Language:    Clear and Coherent  Affect:   Full range   Mood:   Anxious, pleasant  Thought process:   Logical, linear, goal directed  Thought content:     WNL  Sensory/Perceptual disturbances:     none  Orientation:   x4  Attention:   Good  Concentration:   Good  Memory:   Intact  Fund of knowledge:    Consistent with age and development  Insight:     Good  Judgment:    Good  Impulse Control:   Good   Reported Symptoms:  anxiety    Risk Assessment: Danger to Self:  No Self-injurious Behavior: No Danger to Others: No Duty to Warn:no Physical Aggression / Violence:No  Access to Firearms a concern: No  Gang Involvement:No  Patient / guardian was educated about steps to take if suicide or homicide risk level increases between visits: yes While future psychiatric events cannot be accurately predicted, the patient does not currently require acute inpatient psychiatric care and does not currently meet [place]  involuntary commitment criteria.       Subjective:  He shared progress, how his wife continue to work on their communication.  He stated that he has been encouraging recently his she was able to share some faith-based thoughts that he felt bring them more together.  He went on to share their religious background, being different and ultimately more in accord for the last few years.  He stated "it feels like recovering more together recently".  He stated that he is created some less which has been helpful due to his ability to get distracted easily.  He stated further that at times he will get distracted with the use of pornography but this has been less of recently as well.   Explored ways he can to continue to set some interpersonal boundaries to maintain abstinence.  Facilitated his identifying and verbalize positive aspects of abstinence.  He looks forward to taking a vacation next month and they are putting their house on the market.   Interventions: Motivational interviewing, supportive therapy, CBT  Diagnoses:    ICD-10-CM   1. Generalized anxiety disorder  F41.1        ADHD By history  Plan of Care: Patient to follow through with coping as discussed in session.  Patient to continue to utilize his support system.   [name], [facility]

## 2023-05-24 ENCOUNTER — Ambulatory Visit: Admitting: Mental Health

## 2023-05-27 ENCOUNTER — Telehealth: Payer: Self-pay | Admitting: Gastroenterology

## 2023-05-27 NOTE — Telephone Encounter (Signed)
 [employer] called and stated that they where calling in regards to this patient needing to Prior authorization for a colonoscopy he is scheduled to have. Insurance company stated that that they would been clinical notes sent over to them. Insurance is requesting a call back from our Prior authorization team at [phone]. Please advise.

## 2023-06-03 ENCOUNTER — Ambulatory Visit: Admitting: Mental Health

## 2023-06-03 DIAGNOSIS — F411 Generalized anxiety disorder: Secondary | ICD-10-CM

## 2023-06-03 NOTE — Progress Notes (Signed)
 [facility] Psych[facility] Note  Name: Brian Guerrero Date: 06/03/2023 MRN: [id number] DOB: [birth year] PCP: Rae Bugler, MD  Time spent:  51 minutes  Treatment:    ind. therapy  Mental Status Exam:    Appearance:    Casual     Behavior:   Appropriate  Motor:   WNL  Speech/Language:    Clear and Coherent  Affect:   Full range   Mood:   Anxious, pleasant  Thought process:   Logical, linear, goal directed  Thought content:     WNL  Sensory/Perceptual disturbances:     none  Orientation:   x4  Attention:   Good  Concentration:   Good  Memory:   Intact  Fund of knowledge:    Consistent with age and development  Insight:     Good  Judgment:    Good  Impulse Control:   Good   Reported Symptoms:  anxiety    Risk Assessment: Danger to Self:  No Self-injurious Behavior: No Danger to Others: No Duty to Warn:no Physical Aggression / Violence:No  Access to Firearms a concern: No  Gang Involvement:No  Patient / guardian was educated about steps to take if suicide or homicide risk level increases between visits: yes While future psychiatric events cannot be accurately predicted, the patient does not currently require acute inpatient psychiatric care and does not currently meet [place]  involuntary commitment criteria.       Subjective:  He shared progress, how his wife continue to work on their communication.  Assessed progress where patient shared how he has continued to struggle with his pornography use. He stated that he engaged in it over the weekend, inadvertently sent a text link to his contractor by mistake. He said that he was able to speak with him and resolve the situation. Any further shared how he had a meaningful discussion with his wife this morning, sharing with her this occurrence. He stated that she was understanding and supportive, he feels that they're communication as continued to improve. He stated that she was able to acknowledge challenges  that she can bring to the relationship. He expressed a sense of enlightenment, ready to move forward and discontinue the engagement with pornography. He said that he has some lingering rumination and we assisted him and framing thoughts toward reducing and letting go of some of the anxiety. He Expressed having high motivation and explore collaboratively ways to cope. He said they've made significant headway on their house, plan to put it on the market in the next few weeks to ready themselves for [place] move north.  Interventions: Motivational interviewing, supportive therapy, CBT  Diagnoses:    ICD-10-CM   1. Generalized anxiety disorder  F41.1         ADHD By history  Plan of Care: Patient to follow through with coping as discussed in session.  Patient to continue to utilize his support system.   [name], [facility]

## 2023-06-17 ENCOUNTER — Ambulatory Visit: Admitting: Mental Health

## 2023-06-17 DIAGNOSIS — F411 Generalized anxiety disorder: Secondary | ICD-10-CM

## 2023-06-17 NOTE — Progress Notes (Signed)
 [facility]  Name: Brian Guerrero Date: 06/17/2023 MRN: [id number] DOB: 22-Sep-1950 PCP: Rae Bugler, MD  Time spent:  51 minutes  Treatment:    ind. therapy  Mental Status Exam:    Appearance:    Casual     Behavior:   Appropriate  Motor:   WNL  Speech/Language:    Clear and Coherent  Affect:   Full range   Mood:   Anxious, pleasant  Thought process:   Logical, linear, goal directed  Thought content:     WNL  Sensory/Perceptual disturbances:     none  Orientation:   x4  Attention:   Good  Concentration:   Good  Memory:   Intact  Fund of knowledge:    Consistent with age and development  Insight:     Good  Judgment:    Good  Impulse Control:   Good   Reported Symptoms:  anxiety    Risk Assessment: Danger to Self:  No Self-injurious Behavior: No Danger to Others: No Duty to Warn:no Physical Aggression / Violence:No  Access to Firearms a concern: No  Gang Involvement:No  Patient / guardian was educated about steps to take if suicide or homicide risk level increases between visits: yes While future psychiatric events cannot be accurately predicted, the patient does not currently require acute inpatient psychiatric care and does not currently meet [place]  involuntary commitment criteria.       Subjective:  Patient arrived on time for today's session.  Assessed progress where he stated both he and his wife have made substantial progress on readying their house for sale and they plan to put on the market in the coming days.  He went on to share how today is their 50th wedding anniversary, how they enjoyed breakfast with 1 another and a Runner, broadcasting/film/video.  He went on to further share challenges in the relationship some ongoing issues with their communication, his identifying not feeling validated regarding his opinions and at times not feeling respected.  Facilitated his identifying feelings related, he went on to share how he has tried  to address this with his wife however, it has continued for several years intermittently.  He was able to share further details related to differences in their personalities, interests and aptitudes.  He feels she is making effort, trying to communicate to where he feels more heard, understood and appreciates this effort.  He stated further that he has had some anxiety related to his engagement with pornography, how he has refrained recently and went on to share how he feels he carries a sense of guilt about this as it is inconsistent with his values and his faith.  Patient was encouraged to recognize his taking steps to make changes, maintaining boundaries from this behavior.  Further ways to communicate in his marital relationship as well as identifying and sharing needs he has in the relationship with his wife to further express his emotions, was further explored collaboratively.    Interventions: Motivational interviewing, supportive therapy, CBT  Diagnoses:    ICD-10-CM   1. Generalized anxiety disorder  F41.1          ADHD By history  Plan of Care: Patient to follow through with coping as discussed in session.  Patient to continue to utilize his support system.   [name], [facility]

## 2023-06-20 ENCOUNTER — Encounter

## 2023-06-21 ENCOUNTER — Other Ambulatory Visit: Payer: Self-pay

## 2023-06-21 ENCOUNTER — Ambulatory Visit (AMBULATORY_SURGERY_CENTER)

## 2023-06-21 VITALS — Ht 69.0 in | Wt 185.0 lb

## 2023-06-21 DIAGNOSIS — Z860100 Personal history of colon polyps, unspecified: Secondary | ICD-10-CM

## 2023-06-21 MED ORDER — NA SULFATE-K SULFATE-MG SULF 17.5-3.13-1.6 GM/177ML PO SOLN: 1.0000 | Freq: Once | ORAL | 0 refills | Status: AC

## 2023-06-21 NOTE — Progress Notes (Signed)
 Denies allergies to eggs or soy products. Denies complication of anesthesia or sedation. Denies use of weight loss medication. Denies use of O2.   Emmi instructions given for colonoscopy.

## 2023-06-23 ENCOUNTER — Encounter: Payer: Self-pay | Admitting: Gastroenterology

## 2023-07-01 ENCOUNTER — Ambulatory Visit: Admitting: Mental Health

## 2023-07-04 ENCOUNTER — Encounter: Admitting: Gastroenterology

## 2023-07-11 ENCOUNTER — Telehealth: Payer: Self-pay | Admitting: Psychiatry

## 2023-07-11 ENCOUNTER — Telehealth: Payer: Self-pay | Admitting: Gastroenterology

## 2023-07-11 NOTE — Telephone Encounter (Signed)
 New colonoscopy instructions reflecting rescheduled date of 07/19/23 @ 1500 sent via mychart per pt request. SChaplin, RN,BSN

## 2023-07-11 NOTE — Telephone Encounter (Signed)
 Cx prev msg. Pt is headed home today

## 2023-07-11 NOTE — Telephone Encounter (Signed)
 Pt called in at 10:47a stating he is out of town and left his bottle of Seroquel  at home and is wanting to see if he can get a 3 day supply until he gets home.   [facility] [address] [place], [place]

## 2023-07-11 NOTE — Telephone Encounter (Signed)
 PT is calling to have new instructions update to mychart for reschedule colonoscopy 7/15 at 3pm

## 2023-07-19 ENCOUNTER — Ambulatory Visit: Admitting: Mental Health

## 2023-07-19 ENCOUNTER — Encounter: Payer: Self-pay | Admitting: Gastroenterology

## 2023-07-19 ENCOUNTER — Ambulatory Visit: Admitting: Gastroenterology

## 2023-07-19 VITALS — BP 94/75 | HR 60 | Temp 97.5°F | Resp 20 | Ht 69.0 in | Wt 185.0 lb

## 2023-07-19 DIAGNOSIS — D123 Benign neoplasm of transverse colon: Secondary | ICD-10-CM

## 2023-07-19 DIAGNOSIS — K5730 Diverticulosis of large intestine without perforation or abscess without bleeding: Secondary | ICD-10-CM

## 2023-07-19 DIAGNOSIS — Z860100 Personal history of colon polyps, unspecified: Secondary | ICD-10-CM

## 2023-07-19 DIAGNOSIS — K648 Other hemorrhoids: Secondary | ICD-10-CM | POA: Diagnosis not present

## 2023-07-19 DIAGNOSIS — D122 Benign neoplasm of ascending colon: Secondary | ICD-10-CM | POA: Diagnosis not present

## 2023-07-19 DIAGNOSIS — Z1211 Encounter for screening for malignant neoplasm of colon: Secondary | ICD-10-CM | POA: Diagnosis present

## 2023-07-19 DIAGNOSIS — F411 Generalized anxiety disorder: Secondary | ICD-10-CM

## 2023-07-19 DIAGNOSIS — D120 Benign neoplasm of cecum: Secondary | ICD-10-CM

## 2023-07-19 DIAGNOSIS — K644 Residual hemorrhoidal skin tags: Secondary | ICD-10-CM | POA: Diagnosis not present

## 2023-07-19 MED ORDER — SODIUM CHLORIDE 0.9 % IV SOLN: 500.0000 mL | Freq: Once | INTRAVENOUS | Status: DC

## 2023-07-19 NOTE — Progress Notes (Unsigned)
 [facility] History and Physical   Primary Care Physician:  Seabron Lenis, MD   Reason for Procedure:  History of adenomatous colon polyps  Plan:    Surveillance colonoscopy with possible interventions as needed     HPI: Brian Guerrero is a very pleasant 73 y.o. male here for surveillance colonoscopy. Denies any nausea, vomiting, abdominal pain, melena or bright red blood per rectum  The risks and benefits as well as alternatives of endoscopic procedure(s) have been discussed and reviewed. All questions answered. The patient agrees to proceed.    Past Medical History:  Diagnosis Date   Attention or concentration deficit    Bipolar disorder    BPH (benign prostatic hyperplasia)    Cataract    Diverticular disease    [facility]/per pt not aware of this   Lithium -induced tremor    Varicose veins    Wears glasses     Past Surgical History:  Procedure Laterality Date   CATARACT EXTRACTION, BILATERAL     COLONOSCOPY     INGUINAL HERNIA REPAIR Right 04/30/2013   Procedure: HERNIA REPAIR INGUINAL ADULT;  Surgeon: Donnice Bury, MD;  Location: [facility];  Service: General;  Laterality: Right;   WISDOM TOOTH EXTRACTION      Prior to Admission medications   Medication Sig Start Date End Date Taking? Authorizing Provider  Acetylcysteine (NAC 600 PO) Take 1,200 mg by mouth daily.   Yes [provider]  lithium  carbonate 300 MG capsule TAKE 2 CAPSULES BY MOUTH DAILY 05/15/23  Yes Cottle, Lorene KANDICE Raddle., MD  methylphenidate  27 MG PO CR tablet Take 1 tablet (27 mg total) by mouth every morning. 04/01/23  Yes Cottle, Lorene KANDICE Raddle., MD  methylphenidate  27 MG PO CR tablet Take 1 tablet (27 mg total) by mouth every morning. 04/29/23  Yes Cottle, Lorene KANDICE Raddle., MD  methylphenidate  27 MG PO CR tablet Take 1 tablet (27 mg total) by mouth every morning. 05/27/23  Yes Cottle, Lorene KANDICE Raddle., MD  propranolol  (INDERAL ) 10 MG tablet Take 2-4 tablets by mouth  twice daily as needed for tremor 11/21/20  Yes Cottle, Lorene KANDICE Raddle., MD  Pyridoxine HCl (VITAMIN B6) 100 MG TABS Take 500 mg by mouth daily.   Yes [provider]  QUEtiapine  (SEROQUEL ) 300 MG tablet TAKE 1 TABLET BY MOUTH EVERY NIGHT AT BEDTIME 02/10/23  Yes Cottle, Lorene KANDICE Raddle., MD  Vitamin D , Ergocalciferol , (DRISDOL ) 1.25 MG (50000 UNIT) CAPS capsule TAKE 1 CAPSULE BY MOUTH ONCE WEEKLY 02/22/23  Yes Cottle, Lorene KANDICE Raddle., MD  Carnitine-B5-B6 (L-CARNITINE 500 PO) Take 1,000 mg by mouth daily. Patient not taking: No sig reported    [provider]  ibuprofen (ADVIL) 800 MG tablet Take 800 mg by mouth. 12/17/22   [provider]  sildenafil (VIAGRA) 100 MG tablet TAKE 1 TABLET BY MOUTH AS NEEDED AS DIRECTED 03/26/20 06/21/23  Matilda Senior, MD    Current Outpatient Medications  Medication Sig Dispense Refill   Acetylcysteine (NAC 600 PO) Take 1,200 mg by mouth daily.     lithium  carbonate 300 MG capsule TAKE 2 CAPSULES BY MOUTH DAILY 180 capsule 0   methylphenidate  27 MG PO CR tablet Take 1 tablet (27 mg total) by mouth every morning. 30 tablet 0   methylphenidate  27 MG PO CR tablet Take 1 tablet (27 mg total) by mouth every morning. 30 tablet 0   methylphenidate  27 MG PO CR tablet Take 1 tablet (27 mg total) by mouth  every morning. 30 tablet 0   propranolol  (INDERAL ) 10 MG tablet Take 2-4 tablets by mouth twice daily as needed for tremor 540 tablet 0   Pyridoxine HCl (VITAMIN B6) 100 MG TABS Take 500 mg by mouth daily.     QUEtiapine  (SEROQUEL ) 300 MG tablet TAKE 1 TABLET BY MOUTH EVERY NIGHT AT BEDTIME 90 tablet 1   Vitamin D , Ergocalciferol , (DRISDOL ) 1.25 MG (50000 UNIT) CAPS capsule TAKE 1 CAPSULE BY MOUTH ONCE WEEKLY 15 capsule 1   Carnitine-B5-B6 (L-CARNITINE 500 PO) Take 1,000 mg by mouth daily. (Patient not taking: No sig reported)     ibuprofen (ADVIL) 800 MG tablet Take 800 mg by mouth.     sildenafil (VIAGRA) 100 MG tablet TAKE 1 TABLET BY MOUTH AS NEEDED AS  DIRECTED 30 tablet 11   Current Facility-Administered Medications  Medication Dose Route Frequency Provider Last Rate Last Admin   0.9 %  sodium chloride  infusion  500 mL Intravenous Once Barbar Brede V, MD       0.9 %  sodium chloride  infusion  500 mL Intravenous Once Hersh Minney V, MD        Allergies as of 07/19/2023 - Review Complete 07/19/2023  Allergen Reaction Noted   Lincomycin hcl Hives 03/30/2013    Family History  Problem Relation Age of Onset   Depression Mother    Prostate cancer Father    Kidney disease Father    Dementia Father        unspecified type; sympton onset in late 80s/early 86s   Tremor Father    Parkinson's disease Sister    Colon cancer Neg Hx    Rectal cancer Neg Hx    Stomach cancer Neg Hx    Esophageal cancer Neg Hx     Social History   Socioeconomic History   Marital status: Married    Spouse name: Not on file   Number of children: Not on file   Years of education: 17   Highest education level: Bachelor's degree (e.g., BA, AB, BS)  Occupational History   Not on file  Tobacco Use   Smoking status: Never   Smokeless tobacco: Never  Vaping Use   Vaping status: Never Used  Substance and Sexual Activity   Alcohol use: Yes    Alcohol/week: 7.0 standard drinks of alcohol    Types: 7 Cans of beer per week    Comment: 1 beer nightly   Drug use: Never   Sexual activity: Yes  Other Topics Concern   Not on file  Social History Narrative   Not on file   Social Drivers of Health   Financial Resource Strain: Low Risk  (04/01/2023)   Received from [facility]   Overall Financial Resource Strain (CARDIA)    Difficulty of Paying Living Expenses: Not hard at all  Food Insecurity: No Food Insecurity (04/01/2023)   Received from [facility]   Hunger Vital Sign    Within the past 12 months, you worried that your food would run out before you got the money to buy more.: Never true    Within the past 12 months, the food you bought  just didn't last and you didn't have money to get more.: Never true  Transportation Needs: No Transportation Needs (04/01/2023)   Received from [facility]    Lack of Transportation (Medical): No    Lack of Transportation (Non-Medical): No  Physical Activity: Not on file  Stress: Not on file  Social  Connections: Not on file  Intimate Partner Violence: Not on file    Review of Systems:  All other review of systems negative except as mentioned in the HPI.  Physical Exam: Vital signs in last 24 hours: BP 123/73   Pulse 62   Temp (!) 97.5 F (36.4 C) (Temporal)   Ht 5' 9 (1.753 m)   Wt 185 lb (83.9 kg)   SpO2 97%   BMI 27.32 kg/m  General:   Alert, NAD Lungs:  Clear .   Heart:  Regular rate and rhythm Abdomen:  Soft, nontender and nondistended. Neuro/Psych:  Alert and cooperative. Normal mood and affect. A and O x 3  Reviewed labs, radiology imaging, old records and pertinent past GI work up  Patient is appropriate for planned procedure(s) and anesthesia in an ambulatory setting   K. Veena Sora Olivo , MD [phone]

## 2023-07-19 NOTE — Op Note (Signed)
 [facility] Patient Name: Brian Guerrero Procedure Date: 07/19/2023 4:12 PM MRN: [id number] Endoscopist: Gustav ALONSO Mcgee , MD, [id number] Age: 73 Referring MD:  Date of Birth: 05-10-1950 Gender: Male Account #: [id number] Procedure:                Colonoscopy Indications:              High risk colon cancer surveillance: Personal                            history of colonic polyps, High risk colon cancer                            surveillance: Personal history of multiple (3 or                            more) adenomas Medicines:                Monitored Anesthesia Care Procedure:                Pre-Anesthesia Assessment:                           - Prior to the procedure, a History and Physical                            was performed, and patient medications and                            allergies were reviewed. The patient's tolerance of                            previous anesthesia was also reviewed. The risks                            and benefits of the procedure and the sedation                            options and risks were discussed with the patient.                            All questions were answered, and informed consent                            was obtained. Prior Anticoagulants: The patient has                            taken no anticoagulant or antiplatelet agents. ASA                            Grade Assessment: II - A patient with mild systemic                            disease. After reviewing the risks and benefits,  the patient was deemed in satisfactory condition to                            undergo the procedure.                           After obtaining informed consent, the colonoscope                            was passed under direct vision. Throughout the                            procedure, the patient's blood pressure, pulse, and                            oxygen saturations were monitored  continuously. The                            Olympus Scope SN: [id number] was introduced through                            the anus and advanced to the the cecum, identified                            by appendiceal orifice and ileocecal valve. The                            colonoscopy was performed without difficulty. The                            patient tolerated the procedure well. The quality                            of the bowel preparation was good. The ileocecal                            valve, appendiceal orifice, and rectum were                            photographed. Scope In: 4:18:53 PM Scope Out: 4:46:09 PM Scope Withdrawal Time: 0 hours 20 minutes 21 seconds  Total Procedure Duration: 0 hours 27 minutes 16 seconds  Findings:                 The perianal and digital rectal examinations were                            normal.                           A 2 mm polyp was found in the transverse colon. The                            polyp was sessile. The polyp was removed with a  cold biopsy forceps. Resection and retrieval were                            complete.                           Four sessile polyps were found in the transverse                            colon, ascending colon and cecum. The polyps were 3                            to 6 mm in size. These polyps were removed with a                            cold snare. Resection and retrieval were complete.                           Scattered medium-mouthed and small-mouthed                            diverticula were found in the sigmoid colon,                            descending colon, transverse colon and ascending                            colon.                           Non-bleeding external and internal hemorrhoids were                            found during retroflexion. The hemorrhoids were                            small. Complications:            No immediate  complications. Estimated Blood Loss:     Estimated blood loss was minimal. Impression:               - One 2 mm polyp in the transverse colon, removed                            with a cold biopsy forceps. Resected and retrieved.                           - Four 3 to 6 mm polyps in the transverse colon, in                            the ascending colon and in the cecum, removed with                            a cold snare. Resected and retrieved.                           -  Moderate diverticulosis in the sigmoid colon, in                            the descending colon, in the transverse colon and                            in the ascending colon.                           - Non-bleeding external and internal hemorrhoids. Recommendation:           - Patient has a contact number available for                            emergencies. The signs and symptoms of potential                            delayed complications were discussed with the                            patient. Return to normal activities tomorrow.                            Written discharge instructions were provided to the                            patient.                           - Resume previous diet.                           - Continue present medications.                           - Await pathology results.                           - Repeat colonoscopy in 3 years for surveillance                            based on pathology results. Shenoa Hattabaugh V. Poseidon Pam, MD 07/19/2023 4:57:20 PM This report has been signed electronically.

## 2023-07-19 NOTE — Progress Notes (Unsigned)
 Pt's states no medical or surgical changes since previsit or office visit.

## 2023-07-19 NOTE — Progress Notes (Unsigned)
 Vss nad trans to pacu

## 2023-07-19 NOTE — Patient Instructions (Signed)

## 2023-07-19 NOTE — Progress Notes (Unsigned)
 Called to room to assist during endoscopic procedure.  Patient ID and intended procedure confirmed with present staff. Received instructions for my participation in the procedure from the performing physician.

## 2023-07-19 NOTE — Progress Notes (Signed)
 [facility]  Name: Brian Guerrero Date: 07/19/2023 MRN: [id number] DOB: [birth year] PCP: Seabron Lenis, MD  Time spent:  51 minutes  Treatment:    ind. therapy  Mental Status Exam:    Appearance:    Casual     Behavior:   Appropriate  Motor:   WNL  Speech/Language:    Clear and Coherent  Affect:   Full range   Mood:   Anxious, pleasant  Thought process:   Logical, linear, goal directed  Thought content:     WNL  Sensory/Perceptual disturbances:     none  Orientation:   x4  Attention:   Good  Concentration:   Good  Memory:   Intact  Fund of knowledge:    Consistent with age and development  Insight:     Good  Judgment:    Good  Impulse Control:   Good   Reported Symptoms:  anxiety    Risk Assessment: Danger to Self:  No Self-injurious Behavior: No Danger to Others: No Duty to Warn:no Physical Aggression / Violence:No  Access to Firearms a concern: No  Gang Involvement:No  Patient / guardian was educated about steps to take if suicide or homicide risk level increases between visits: yes While future psychiatric events cannot be accurately predicted, the patient does not currently require acute inpatient psychiatric care and does not currently meet [place]  involuntary commitment criteria.       Subjective:  Patient arrived on time for today's session.  He shared recent events where he and his wife went up [place] to [place]  where they plan to move in the next few months after the sale of their home.  He stated their home has been on the market but he has an understanding that it might take some time to sell the property.  He went on to share how he can feel some level of stress and anxiety related to not feeling heard, after further guided discovery, he identified wanting to feel validated.  He shared how this can be evident in the relationship with his wife going on to share some examples.  Engaged in some role play to further  understand as well as assist him in identifying how to give feedback to advocate and clarify his thoughts and feelings when needed.   Interventions: Motivational interviewing, supportive therapy, CBT  Diagnoses:    ICD-10-CM   1. Generalized anxiety disorder  F41.1       ADHD By history  Plan of Care: Patient to follow through with coping as discussed in session.  Patient to continue to utilize his support system.   Lonni Fischer, [facility]

## 2023-07-20 ENCOUNTER — Telehealth: Payer: Self-pay | Admitting: *Deleted

## 2023-07-20 NOTE — Telephone Encounter (Signed)
 Attempted post procedure follow up call.  No answer - LVM.

## 2023-07-22 LAB — SURGICAL PATHOLOGY

## 2023-07-25 ENCOUNTER — Ambulatory Visit: Payer: Medicare Other | Admitting: Psychiatry

## 2023-07-25 ENCOUNTER — Encounter: Payer: Self-pay | Admitting: Psychiatry

## 2023-07-25 DIAGNOSIS — Z79899 Other long term (current) drug therapy: Secondary | ICD-10-CM

## 2023-07-25 DIAGNOSIS — R7989 Other specified abnormal findings of blood chemistry: Secondary | ICD-10-CM

## 2023-07-25 DIAGNOSIS — F411 Generalized anxiety disorder: Secondary | ICD-10-CM | POA: Diagnosis not present

## 2023-07-25 DIAGNOSIS — F319 Bipolar disorder, unspecified: Secondary | ICD-10-CM | POA: Diagnosis not present

## 2023-07-25 DIAGNOSIS — F5105 Insomnia due to other mental disorder: Secondary | ICD-10-CM | POA: Diagnosis not present

## 2023-07-25 DIAGNOSIS — E559 Vitamin D deficiency, unspecified: Secondary | ICD-10-CM

## 2023-07-25 DIAGNOSIS — F900 Attention-deficit hyperactivity disorder, predominantly inattentive type: Secondary | ICD-10-CM | POA: Diagnosis not present

## 2023-07-25 DIAGNOSIS — G251 Drug-induced tremor: Secondary | ICD-10-CM

## 2023-07-25 MED ORDER — METHYLPHENIDATE HCL ER (OSM) 27 MG PO TBCR
27.0000 mg | EXTENDED_RELEASE_TABLET | Freq: Every morning | ORAL | 0 refills | Status: AC
Start: 2023-08-22 — End: ?

## 2023-07-25 MED ORDER — QUETIAPINE FUMARATE 300 MG PO TABS: 300.0000 mg | ORAL_TABLET | Freq: Every day | ORAL | 1 refills | Status: DC

## 2023-07-25 MED ORDER — METHYLPHENIDATE HCL ER (OSM) 27 MG PO TBCR: 27.0000 mg | EXTENDED_RELEASE_TABLET | Freq: Every morning | ORAL | 0 refills | Status: DC

## 2023-07-25 MED ORDER — LITHIUM CARBONATE 300 MG PO CAPS: 600.0000 mg | ORAL_CAPSULE | Freq: Every day | ORAL | 0 refills | Status: DC

## 2023-07-25 MED ORDER — METHYLPHENIDATE HCL ER (OSM) 27 MG PO TBCR: 27.0000 mg | EXTENDED_RELEASE_TABLET | Freq: Every morning | ORAL | 0 refills | Status: AC

## 2023-07-25 NOTE — Progress Notes (Signed)
 Brian Guerrero [id number] 08-30-1950 73 y.o.     Subjective:   Patient ID:  Brian Guerrero is a 73 y.o. (DOB December 16, 1950) male.  Chief Complaint:  Chief Complaint  Patient presents with   Follow-up   Depression   Anxiety   ADD   Medication Reaction      Brian Guerrero presents to the office today for follow-up of med change.  At visit  March 07, 2018.  For problems with attention and focus Concerta  was increased from 27 mg a day to 30 mg a day.  In January we reduced the Wellbutrin  and added Concerta  27.  More stamina and more alert but still struggle with concentration and memory and tremor.  Can't say if it's new or a lifelong thing.  Tolerating the Concerta  OK.  Thinks he could tolerate an increase in the Concerta .  Occ propranolol  for tremor.  seen Jun 02, 2018.  No meds were changed. He had received benefit from the increase Concerta  given in March.  seen 10/05/18.  Has stopped Concerta  bc cost prohibitive.  Taking instead Ritalin  and can't remember it TID.  Does get benefit but liked Concerta  better.  Overall good and likes retirement.  Was too stressed at work before..  Tremors resolved.  Doing hobbies and travel. Brian concerned about episode of mania she noticed in May.  Reduced sleep, faster driving, spending and talking faster.  Not more severe.  Lasted a few weeks and resolved after switch to Ritalin .  She doesn't see this now.  He didn't think it lasted more than a few days.  She thinks he didn't recognize it.  It was not severe. No med changes.  05/16/19 appt noted.\ Doing well with mood.  Liked Concerta  better and hard to take 3 daily of Ritalin .  Otherwise no med changes.  Cataract surgery helped. Enjoyed retirements. Patient reports stable mood and denies depressed or irritable moods.  Patient denies any recent difficulty with anxiety. Rare anxiety.   Patient denies difficulty with sleep initiation or maintenance. Denies appetite disturbance but he's had  20# weight loss. He's getting less sugar in retirement and stays active.  Patient reports that energy and motivation have been good.  Patient denies any difficulty with concentration.  Patient denies any suicidal ideation.  Sleep 8 hours.  Not oversleeping. Retired at 73 yo. Feels good about it.  Enjoying it so far.  Working on new schedule.  Tackling some home projects. Motivation is better.  No depression.   Plan no med changes:  02/26/2020 appointment with following noted: It is been a number of months since the last appointment.  Longer than requested by MD. Doing well and liking retirement. Motivation is still not great but Ritalin  helps.   Tremor no worse. As always, wanting to ask about cutting back on lithium  and quetiapine  bc under much less stress with retirement.  Didn't realize how much he was stressed. Quetiapine  very effective for sleep and can't sleep if misses a dose. Admits he decreased quetiapine  from 600 to 450 mg HS months ago. Patient reports stable mood and denies depressed or irritable moods.  Patient denies any recent difficulty with anxiety.  Patient denies difficulty with sleep initiation or maintenance. Denies appetite disturbance.  Patient reports that energy and motivation have been good.  Patient denies any difficulty with concentration.  Patient denies any suicidal ideation. Plan: Continue lithium  600 mg daily and  OK to try reducing quetiapine  to 300 mg nightly for bipolar depression. Continue  Wellbutrin  to 300 mg daily and  Ritalin  10 TID mg daily.    08/26/2020 appointment with the following noted: Seems to be working fine.  Rare sleep problems.  No SE with meds and no hangover. Overall good mood.  Brian noticed 3 day period of hypomania and he tried to address by increasing sleep and it resolved.  Not totally clear of depressive sx but not debilitating and Ritalin  clearly helps functions and  alertness. Still some tremor. 1-2 cups coffee in AM. Plan: Continue  meds except Wants to see again if Concerta  could be affordable in place of Ritalin  bc smoother and longer acting.  OK 36 mg  Watch vitamin D  level  12/23/2020 appointment with the following noted: Wants to try Concerta  again instead of Ritalin  10 TID. Doing well otherwise.  Mood is ok.  Denies unusual mood swings but mild hypomania 4-5 days recenlty without being problematic. Had neuropsych testing. Memory stable.  Suggesting psychotherapy. Propranolol  helps tremor.   Plan: Continue lithium  600 mg daily, quetiapine  300 mg nightly, Wellbutrin  300 mg daily and Ritalin  10 mg 3 times daily.  He is also taking propranolol  as needed tremor  04/01/2021 phone call.  Saw a neurologist about her tremors.  Wonders about stopping a medication that could be contributing to tremor.  Wife also voiced concerns about potential mild manic symptoms. Recommended that he stop the Wellbutrin  which could contribute to both issues.  Call if symptoms worsen.  04/29/2021 appointment with the following noted:  seen with wife Brian Guerrero temporarily supply of concerta  but insurance won't pay for more. Liked Concerta  bc more effective, but maybe a little too strong with jumpiness. Stopped Wellbutrin  about 4 weeks ago.  Didn't notice much change. Tremors are much improved overall. He thinks his mood is improved and pretty stable. Excitable.not depressed.  Sleep excellent with quetiapine . Admits being more hyper.  More aggressive out of character and others have noticed.  Increased sexual interest. Family concerned about his memory and has seen neurologist.  Wife concerned about his posture.  Wife says kids noticed a little aggressiveness and some mild manic sx with less need for sleep. She noticed for several mos.  Wife sees less aggression off the Wellbutrin  DX [facility] per neuropsych testing. Plan: DC Wellbutrin  and continue Ritalin  10 TID mg daily or Concerta  27 mg AM.    07/28/2021 appointment with the following  noted: Remains on lithium  300 mg twice daily, Concerta  27 or 36 mg daily versus Ritalin  10 mg 3 times daily depending on availability, propranolol  20 to 40 mg twice daily as needed tremor, quetiapine  300 mg nightly Most recent Concerta  27 mg dose was #60 instead of #90.  He feels he's gotten more tolerant to Concerta  and no SE Sees benefits with stimulants for task completion and more willing to do things and less fear of mistakes and more energy. Denies manic sx lately and thinks Brian would agree. Sleep very well.   Tremors much improved but still affects fine motor.  Not depressed.    Anxiety usually ok. Fixing up second home in [address] ready for year round and doesn't like to call people which causes anxiety. Can be edgy driving with wife bc they are different. Plan: vitamin D . It was drawn late 2022= 38 5000 units through winter and then drop back to 2000 units daily  01/06/2022 appointment noted: Good  Xmas with kids and grandkids.  Planning to move to [address] eventually in the next year or two.  Still some struggles with concentration and lack of interest.  Should be more motivated but nothing has really changed much.  Can enjoy things.  Is productive when in [address]. Psych meds: lithium  300 BID, Concerta  27 AM, quetiapine  300 HS , propranonol 10 prn.   No SE Concerta  helps morning energy.  Not very productive by 4 pm. Plan: Check lithium  level and continue other meds.  07/21/2022 appointment noted: Current psych meds lithium  300 mg twice daily, Concerta  27 mg every morning, propranolol  10 mg tablets 1-2 twice daily as needed for tremor, vitamin B6 for tremor, quetiapine  300 mg nightly, vitamin D .  Because level the 30s. Lithium  level ordered at the last visit but received. Half here and half in [place]. Will be in [place] until 08/24/22 and back in October.   Met some friendly people there. Some struggle with motivation but nothing unusual with mood and anxiety.  Tremor are a good bit  better.  Still taking B6 and propranolol .  No other SE. Sleep good with quetiapine  and acitivity. Good interest and enjoyment generally.  No sig anxiety other than normal worry. Health is good. W enjoys hiking.  He struggles to keep up with her.    01/25/23 appt noted: Current psych meds lithium  300 mg 2 each evening, Concerta  27 mg every morning, propranolol  10 mg tablets 1-2 twice daily as needed for tremor, vitamin B6 for tremor, quetiapine  300 mg nightly, vitamin D .  Because level the 30s.  Vid D 50K weekly. Lithium  level ordered at the last visit but received. Felt responsible for aunt Alfonse in [place] in hosp then rehab.  Had to help her move out of apt into [place].  Stressful and overwhelming but not as bad as he worried it would be.   Working on rennovation of their place in [place].   Seems like could use some more counseling.  Gets too overwhelmed.  Over worry with minor stressors.  Will be in [place] until about July.   No SE. No sedation from quetiapine .  Some incr waking in middle of night from time to time.  Still effective overall.  Avg 8-9 hours sleep. No mania.  No mood swings.  Some diff with motivation.  Went through period of down on himself but coming out of it.   Tremor not problem unless more than 2 cups coffee.   Plan: Check lihtium level and vit D.  07/25/23 appt noted:  Med: NAC, lithium  300 mg capsules 2 nightly, Concerta  27 every morning, propranolol  10 mg tablets 2 to 4 tablets twice daily as needed tremor, B6 had been recommended for tremor, quetiapine  300 nightly. Was overwhelmed with move and other factors.  Guerrero overwhelmed and started therapy.  In AM awakens an hour early with anxiety.  Lasts until gets OOB usually an hour later.  Then it subsides some.  Except with stresss.    EMA for 3-4 mos almost daily.  Not tired or sleepy during the day.  Avg 8 hour sleep.  Hopes to move to [place] in about November.  Some neg self talk and down on himself but shakes it off.  More  anxious.      Past Psychiatric Medication Trials: Propranolol  20 AM Lithium  600 mg max dose tolerated DT tremor Seroquel  300 Wellbutrin  300 Ritalin  10 mg TID Concerta  36 too much  M history of severe TRD  Review of Systems:  Review of Systems  Respiratory:  Negative for shortness of breath.   Cardiovascular:  Negative for chest  pain and palpitations.  Neurological:  Negative for tremors.       No falls  Psychiatric/Behavioral:  Negative for agitation, behavioral problems, confusion, decreased concentration, dysphoric mood, hallucinations, self-injury, sleep disturbance and suicidal ideas. The patient is not nervous/anxious and is not hyperactive.   tremor variable worse with caffeine  Medications: I have reviewed the patient's current medications.  Current Outpatient Medications  Medication Sig Dispense Refill   Acetylcysteine (NAC 600 PO) Take 1,200 mg by mouth daily.     Carnitine-B5-B6 (L-CARNITINE 500 PO) Take 1,000 mg by mouth daily.     ibuprofen (ADVIL) 800 MG tablet Take 800 mg by mouth.     propranolol  (INDERAL ) 10 MG tablet Take 2-4 tablets by mouth twice daily as needed for tremor (Patient taking differently: Take 10 mg by mouth daily. Take 2-4 tablets by mouth twice daily as needed for tremor) 540 tablet 0   Pyridoxine HCl (VITAMIN B6) 100 MG TABS Take 500 mg by mouth daily.     Vitamin D , Ergocalciferol , (DRISDOL ) 1.25 MG (50000 UNIT) CAPS capsule TAKE 1 CAPSULE BY MOUTH ONCE WEEKLY 15 capsule 1   lithium  carbonate 300 MG capsule Take 2 capsules (600 mg total) by mouth daily. 180 capsule 0   [START ON 08/22/2023] methylphenidate  27 MG PO CR tablet Take 1 tablet (27 mg total) by mouth every morning. 30 tablet 0   [START ON 09/19/2023] methylphenidate  27 MG PO CR tablet Take 1 tablet (27 mg total) by mouth every morning. 30 tablet 0   methylphenidate  27 MG PO CR tablet Take 1 tablet (27 mg total) by mouth every morning. 30 tablet 0   QUEtiapine  (SEROQUEL ) 300 MG tablet Take 1 tablet  (300 mg total) by mouth at bedtime. 90 tablet 1   sildenafil (VIAGRA) 100 MG tablet TAKE 1 TABLET BY MOUTH AS NEEDED AS DIRECTED 30 tablet 11   Current Facility-Administered Medications  Medication Dose Route Frequency Provider Last Rate Last Admin   0.9 %  sodium chloride  infusion  500 mL Intravenous Once Nandigam, Kavitha V, MD        Medication Side Effect:  noted above  Allergies:  Allergies  Allergen Reactions   Lincomycin Hcl Hives    As a child/had a hive    Past Medical History:  Diagnosis Date   Attention or concentration deficit    Bipolar disorder    BPH (benign prostatic hyperplasia)    Cataract    Diverticular disease    [facility]/per pt not aware of this   Lithium -induced tremor    Varicose veins    Wears glasses     Family History  Problem Relation Age of Onset   Depression Mother    Prostate cancer Father    Kidney disease Father    Dementia Father        unspecified type; sympton onset in late 80s/early 18s   Tremor Father    Parkinson's disease Sister    Colon cancer Neg Hx    Rectal cancer Neg Hx    Stomach cancer Neg Hx    Esophageal cancer Neg Hx     Social History   Socioeconomic History   Marital status: Married    Spouse name: Not on file   Number of children: Not on file   Years of education: 17   Highest education level: Bachelor's degree (e.g., BA, AB, BS)  Occupational History   Not on file  Tobacco Use   Smoking status: Never   Smokeless tobacco:  Never  Vaping Use   Vaping status: Never Used  Substance and Sexual Activity   Alcohol use: Yes    Alcohol/week: 7.0 standard drinks of alcohol    Types: 7 Cans of beer per week    Comment: 1 beer nightly   Drug use: Never   Sexual activity: Yes  Other Topics Concern   Not on file  Social History Narrative   Not on file   Social Drivers of Health   Financial Resource Strain: Low Risk  (04/01/2023)   Received from [facility]   Overall Financial Resource Strain  (CARDIA)    Difficulty of Paying Living Expenses: Not hard at all  Food Insecurity: No Food Insecurity (04/01/2023)   [facility] from [facility]   Hunger Vital Sign    Within the past 12 months, you worried that your food would run out before you Guerrero the money to buy more.: Never true    Within the past 12 months, the food you bought just didn't last and you didn't have money to get more.: Never true  Transportation Needs: No Transportation Needs (04/01/2023)   Received from [facility] - Transportation    Lack of Transportation (Medical): No    Lack of Transportation (Non-Medical): No  Physical Activity: Not on file  Stress: Not on file  Social Connections: Not on file  Intimate Partner Violence: Not on file    Past Medical History, Surgical history, Social history, and Family history were reviewed and updated as appropriate.   Please see review of systems for further details on the patient's review from today.   Objective:   Physical Exam:  There were no vitals taken for this visit.  Physical Exam Constitutional:      General: He is not in acute distress. Musculoskeletal:        General: No deformity.  Neurological:     Mental Status: He is alert and oriented to person, place, and time.     Cranial Nerves: No dysarthria.     Motor: Tremor present.     Coordination: Coordination normal.  Psychiatric:        Attention and Perception: Attention and perception normal. He does not perceive auditory or visual hallucinations.        Mood and Affect: Mood is anxious. Mood is not depressed. Affect is not labile, blunt, angry or inappropriate.        Speech: Speech normal. Speech is not rapid and pressured.        Behavior: Behavior normal. Behavior is not slowed or hyperactive. Behavior is cooperative.        Thought Content: Thought content normal. Thought content is not paranoid or delusional. Thought content does not include homicidal or suicidal ideation. Thought  content does not include suicidal plan.        Cognition and Memory: Cognition and memory normal.        Judgment: Judgment normal.     Comments: Insight fair.  Perhaps mild depression not bad but easily stressed and overwhelmed.  Manageable.     Lab Review:     Component Value Date/Time   NA 140 07/30/2022 1537   NA 139 08/31/2019 1631   K 4.3 07/30/2022 1537   CL 106 07/30/2022 1537   CO2 24 07/30/2022 1537   GLUCOSE 93 07/30/2022 1537   BUN 19 07/30/2022 1537   BUN 28 (H) 08/31/2019 1631   CREATININE 1.15 07/30/2022 1537   CALCIUM 9.8 07/30/2022 1537  GFRNONAA 75 08/31/2019 1631   GFRAA 86 08/31/2019 1631       Component Value Date/Time   WBC 6.4 04/26/2013 0930   RBC 4.69 04/26/2013 0930   HGB 14.6 04/26/2013 0930   HCT 42.3 04/26/2013 0930   PLT 266 04/26/2013 0930   MCV 90.2 04/26/2013 0930   MCH 31.1 04/26/2013 0930   MCHC 34.5 04/26/2013 0930   RDW 13.4 04/26/2013 0930   LYMPHSABS 1.2 04/26/2013 0930   MONOABS 0.7 04/26/2013 0930   EOSABS 0.3 04/26/2013 0930   BASOSABS 0.0 04/26/2013 0930    Lithium  Lvl  Date Value Ref Range Status  07/30/2022 0.5 (L) 0.6 - 1.2 mmol/L Final    No results found for: PHENYTOIN, PHENOBARB, VALPROATE, CBMZ   .res Assessment: Plan:    Generalized anxiety disorder  Bipolar I disorder (HCC) - Plan: Lithium  level, Basic metabolic panel, TSH, QUEtiapine  (SEROQUEL ) 300 MG tablet, lithium  carbonate 300 MG capsule  Attention deficit hyperactivity disorder (ADHD), predominantly inattentive type - Plan: methylphenidate  27 MG PO CR tablet, methylphenidate  27 MG PO CR tablet, methylphenidate  27 MG PO CR tablet  Insomnia due to mental condition  Low vitamin D  level - Plan: VITAMIN D  25 Hydroxy (Vit-D Deficiency, Fractures)  Lithium -induced tremor  Lithium  use - Plan: Lithium  level, Basic metabolic panel, TSH  Avitaminosis D   Mr. Macho has a history of bipolar depression and anxiety along with his problems with  concentration are related to a type of acquired ADD due to the consequence of chronic bipolar depression with inadequate response to typical bipolar disorder medications as well as Wellbutrin .  Since this concentration problem was serious enough to be affecting his job function and potentially forcing retirement I offered him the option of treating it with stimulants.   He has retired now but still needs the stimulant to help with concentration and focus, motivation and productivity at home.  Retirement a benefit to mental health. Disc importance of staying active to manage risk of depression.    Previously discussed the neurology and neuropsych testing with the patient and his wife.  It is unlikely that psych meds are causing cognitive impairment.  It is possible that they have been causing or contributing to tremor.    reduced the Concerta  to 27 mg daily to minimize tremor and reduce manic risk. Discussed potential benefits, risks, and side effects of stimulants with patient to include increased heart rate, palpitations, insomnia, increased anxiety, increased irritability, or decreased appetite.  Instructed patient to contact office if experiencing any significant tolerability issues.   The main benefit is mood, energy, alertness.  Still some conc and motivation px.  Concerta  27 mg AM.    Tremor related to medications have recurred and he saw neurologist and disc that and neuropsych testing.  Tremor is much better unless drinks too much caffeine.  Disc this. Propranolol  for tremor 10- 30 mg BID prn .  He reports it helps the tremor. Disc SE in detail.  Continue lithium  600 mg daily and  Cont quetiapine  to 300 mg nightly for bipolar depression.  Discussed potential metabolic side effects associated with atypical antipsychotics, as well as potential risk for movement side effects. Advised pt to contact office if movement side effects occur.   AGAIN Counseled patient regarding potential benefits,  risks, and side effects of lithium  to include potential risk of lithium  affecting thyroid  and renal function.  Discussed need for periodic lab monitoring to determine drug level and to assess for potential adverse effects.  Counseled patient regarding signs and symptoms of lithium  toxicity and advised that they notify office immediately or seek urgent medical attention if experiencing these signs and symptoms.  Patient advised to contact office with any questions or concerns. Need lithium  level to reduce risk toxicity.   Option of  use of propranolol  before work to help with the tremor.   Saw Dr. Evonnie for tremor and she also suggested neuropsych testing for memory concerns. Disc results from neuropsych testing and the suggestion about counseling.  Get OOB after 8 hours and don't lay there with worry.   Extensive discussion about finding new psychiatrist and rec not making major med changes   AGAIN rec CHECK lithium  level and labs and D  His vitamin D  level was 36 in past and needs to be repeated and consideration could be given to increasing vitamin D . It was drawn late 2022= 38 Given Rx vit D continue it.  Repeat level.  No med changes  Helped by counseling for better stress management  FU  mos  Lorene Macintosh, MD, DFAPA   Future Appointments  Date Time Provider Department Center  07/28/2023 10:00 AM Bernardo Bruckner, [facility] None  11/02/2023  2:30 PM Cottle, Lorene KANDICE Raddle., MD [facility] None       Orders Placed This Encounter  Procedures   Lithium  level   Basic metabolic panel   TSH   VITAMIN D  25 Hydroxy (Vit-D Deficiency, Fractures)      -------------------------------

## 2023-07-28 ENCOUNTER — Ambulatory Visit: Admitting: Mental Health

## 2023-07-28 DIAGNOSIS — F411 Generalized anxiety disorder: Secondary | ICD-10-CM

## 2023-07-28 DIAGNOSIS — F900 Attention-deficit hyperactivity disorder, predominantly inattentive type: Secondary | ICD-10-CM | POA: Diagnosis not present

## 2023-07-28 NOTE — Progress Notes (Signed)
 [facility] Counsel[facility] Psychotherapy Note  Name: Brian Guerrero Date: 07/28/2023 MRN: [id number] DOB: 28-Nov-1950 PCP: Seabron Lenis, MD  Time spent:  51 minutes  Treatment:    ind. therapy  Mental Status Exam:    Appearance:    Casual     Behavior:   Appropriate  Motor:   WNL  Speech/Language:    Clear and Coherent  Affect:   Full range   Mood:   Anxious, pleasant  Thought process:   Logical, linear, goal directed  Thought content:     WNL  Sensory/Perceptual disturbances:     none  Orientation:   x4  Attention:   Good  Concentration:   Good  Memory:   Intact  Fund of knowledge:    Consistent with age and development  Insight:     Good  Judgment:    Good  Impulse Control:   Good   Reported Symptoms:  anxiety    Risk Assessment: Danger to Self:  No Self-injurious Behavior: No Danger to Others: No Duty to Warn:no Physical Aggression / Violence:No  Access to Firearms a concern: No  Gang Involvement:No  Patient / guardian was educated about steps to take if suicide or homicide risk level increases between visits: yes While future psychiatric events cannot be accurately predicted, the patient does not currently require acute inpatient psychiatric care and does not currently meet [place]  involuntary commitment criteria.     Subjective:  Patient arrived on time for today's session.  Assessed progress.  He looks forward to the vacation is going to check with his wife next week as they are going on a cruise.  He stated that at this point they have gotten their house ready for sale in the hopes that they will be able to sell over the next few months.  They continued with their plan to move to [facility].  Facilitated his identifying needs where he shared how he needs more supportive friendships going on to share some history with friends, how he is lost contact over the years.  Collaboratively, explored ways to potentially reconnect with some friends with him he feels he  would like to rekindle a potential relationship.  He is trying to be mindful of how he communicates to his wife, refrain from critical comments that might hurt her feelings, although he may not set out to, but recognizes this can be with the result. Through further guided discovery, he identified feeling that he did not hit his potential in some aspects of his life, primarily centered around his career choices.  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   Interventions: Motivational interviewing, supportive therapy, CBT  Diagnoses:    ICD-10-CM   1. Generalized anxiety disorder  F41.1     2. Attention deficit hyperactivity disorder (ADHD), predominantly inattentive type  F90.0        ADHD By history  Plan of Care: Patient to follow through with coping as discussed in session.  Patient to continue to utilize his support system.   Lonni Fischer, [facility]

## 2023-07-29 LAB — VITAMIN D 25 HYDROXY (VIT D DEFICIENCY, FRACTURES): Vit D, 25-Hydroxy: 46.6 ng/mL (ref 30.0–100.0)

## 2023-07-29 LAB — TSH: TSH: 4.39 u[IU]/mL (ref 0.450–4.500)

## 2023-07-29 LAB — LITHIUM LEVEL: Lithium Lvl: 0.5 mmol/L (ref 0.5–1.2)

## 2023-07-29 LAB — BASIC METABOLIC PANEL WITH GFR
BUN/Creatinine Ratio: 19 (ref 10–24)
BUN: 18 mg/dL (ref 8–27)
CO2: 20 mmol/L (ref 20–29)
Calcium: 9.2 mg/dL (ref 8.6–10.2)
Chloride: 106 mmol/L (ref 96–106)
Creatinine, Ser: 0.96 mg/dL (ref 0.76–1.27)
Glucose: 98 mg/dL (ref 70–99)
Potassium: 4.5 mmol/L (ref 3.5–5.2)
Sodium: 140 mmol/L (ref 134–144)
eGFR: 83 mL/min/1.73 (ref >59)

## 2023-08-15 NOTE — Progress Notes (Signed)
 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  Discussed possibilities for finding white matter disease and atrophy. Reports he has had B12 level done with primary care. Repeat neurocognitive testing for comparison sake.  We are going to try to get him in before he moves to [place] , but I could not make any promises   2.  Tremor             -This is likely due to the lithium , but is pretty mild today.  Patient is certainly on other medications that could contribute to tremor (quetiapine , methylphenidate ) but it is likely the lithium  that is the main source of tremor).  He finds propranolol  10mg , 2 po q AM helpful without SE Subjective:   Brian Guerrero was seen today in neurologic f/u at the request of Seabron Lenis, MD.  Wife supplements history.   He is concerned about memory issues.   I saw the patient several years ago for the same.  He had neurocognitive testing with Dr. Richie in July, 2022 which was unremarkable.  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  Dr. Geoffry notes that excessive worry continues to be an issue.  He is following with a Veterinary surgeon.  Patient plans to move to [place]  by the end of the year.  He has retired, but is still on methylphenidate  for concentration and focus.  He notes today that he went to [facility] before coming here as he got confused - if I had paid closer attention I would have noticed.  He did similar went to his endoscopy suite.  The patient lives with his spouse.  He does the finances and wife notes that there have been late payment notices.  There has been a lot of stress with the move.    He drives without trouble.  He has not had any motor vehicle accidents.  He does some cooking.  He manages his own medications; he doesn't forget his medications.  He performs his own activities of daily living. Pt states that he notices  more memory trouble than wife does but she states that he will try and compensate and it will make things worse because he has not communicated it to her.  Mood is a little south of pleasant.  He admits to being anxious but thinks that the medication is managing the depression.      He thinks tremor is actually improved compared to previous.  Uses propranolol  daily (every day).  No near syncope.      ALLERGIES:   Allergies  Allergen Reactions   Lincomycin Hcl Hives    As a child/had a hive    CURRENT MEDICATIONS:  Outpatient Encounter Medications as of 08/17/2023  Medication Sig   Acetylcysteine (NAC 600 PO) Take 1,200 mg by mouth daily.   lithium  carbonate 300 MG capsule Take 2 capsules (600 mg total) by mouth daily.   [START ON 08/22/2023] methylphenidate  27 MG PO CR tablet Take 1 tablet (27 mg total) by mouth every morning.   [START ON 09/19/2023] methylphenidate  27 MG PO CR tablet Take 1 tablet (27 mg total) by mouth every morning.   propranolol  (INDERAL ) 10 MG tablet Take 2-4 tablets by mouth twice daily as needed for tremor   Pyridoxine HCl (VITAMIN B6) 100 MG TABS Take 500 mg by mouth daily.   QUEtiapine  (SEROQUEL ) 300 MG tablet Take 1 tablet (300 mg total) by mouth  at bedtime.   Vitamin D , Ergocalciferol , (DRISDOL ) 1.25 MG (50000 UNIT) CAPS capsule TAKE 1 CAPSULE BY MOUTH ONCE WEEKLY   Carnitine-B5-B6 (L-CARNITINE 500 PO) Take 1,000 mg by mouth daily. (Patient not taking: Reported on 08/17/2023)   ibuprofen (ADVIL) 800 MG tablet Take 800 mg by mouth.   methylphenidate  27 MG PO CR tablet Take 1 tablet (27 mg total) by mouth every morning.   sildenafil (VIAGRA) 100 MG tablet TAKE 1 TABLET BY MOUTH AS NEEDED AS DIRECTED (Patient not taking: Reported on 08/17/2023)   Facility-Administered Encounter Medications as of 08/17/2023  Medication   0.9 %  sodium chloride  infusion    Objective:   PHYSICAL EXAMINATION:    VITALS:   Vitals:   08/17/23 1020  BP: 106/72  Pulse: 85   SpO2: 98%  Weight: 187 lb (84.8 kg)  Height: 5' 9 (1.753 m)    GEN:  Normal appears male in no acute distress.  Appears stated age. HEENT:  Normocephalic, atraumatic. The mucous membranes are moist. The superficial temporal arteries are without ropiness or tenderness. Cardiovascular: Regular rate and rhythm. Lungs: Clear to auscultation bilaterally. Neck/Heme: There are no carotid bruits noted bilaterally.  NEUROLOGICAL: Orientation:      08/17/2023   10:00 AM  Montreal Cognitive Assessment   Visuospatial/ Executive (0/5) 4  Naming (0/3) 3  Attention: Read list of digits (0/2) 2  Attention: Read list of letters (0/1) 1  Attention: Serial 7 subtraction starting at 100 (0/3) 3  Language: Repeat phrase (0/2) 2  Language : Fluency (0/1) 1  Abstraction (0/2) 2  Delayed Recall (0/5) 4  Orientation (0/6) 6  Total 28    Cranial nerves: There is good facial symmetry.  Extraocular muscles are intact and visual fields are full to confrontational testing. Speech is fluent and clear. Soft palate rises symmetrically and there is no tongue deviation. Hearing is intact to conversational tone. Tone: Tone is good throughout. Sensation: Sensation is intact to light touch throughout.  No extinction with double simultaneous stimulation. Coordination:  The patient has no difficulty with RAM's or FNF bilaterally. Motor: Strength is 5/5 in the bilateral upper and lower extremities.  Shoulder shrug is equal and symmetric. There is no pronator drift.  There are no fasciculations noted. Gait and Station: The patient is able to ambulate without difficulty. The patient is able to heel toe walk without any difficulty. The patient is able to ambulate in a tandem fashion. The patient is able to stand in the Romberg position. Abnormal movements: There is mild tremor of the outstretched hands.  Mild tremor with Archimedes spirals.  This is not worse than several years ago.    Total time spent on today's visit  was 50 minutes, including both face-to-face time and nonface-to-face time.  Time included that spent on review of records (prior notes available to me/labs/imaging if pertinent), discussing treatment and goals, answering patient's questions and coordinating care.   Cc:  Seabron Lenis, MD

## 2023-08-15 NOTE — Progress Notes (Unsigned)
 Assessment/Plan:   ***Tremor             -This is likely due to the lithium .  Patient is certainly on other medications that could contribute to tremor (quetiapine , methylphenidate ) but it is likely the lithium  that is the main source of tremor).  He finds propranolol  10mg , 2 po q AM potentially helpful (not sure).  Without changing the lithium , I think that this is the best source of treatment. Subjective:   Brian Guerrero was seen today in neurologic consultation at the request of Seabron Lenis, MD.  The consultation is for the evaluation of ***.  I saw the patient several years ago for the same.  He had neurocognitive testing with Dr. Richie in July, 2022 which was unremarkable.  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  Dr. Geoffry notes that excessive worry continues to be an issue.  He is following with a Veterinary surgeon.  Patient plans to move to [place]  by the end of the year.  He has retired, but is still on methylphenidate  for concentration and focus.  The patient lives with his spouse.  He does the finances.  He has no trouble managing that.  He drives without trouble.  He has not had any motor vehicle accidents.  He does some cooking.  He manages his own medications.  He performs his own activities of daily living.  The patient is a 73 y.o. year old male with a history of ***.  The first symptom began *** years ago.   Neuroimaging has *** previously been performed.  It *** available for my review today.  PREVIOUS MEDICATIONS: ***  ALLERGIES:   Allergies  Allergen Reactions   Lincomycin Hcl Hives    As a child/had a hive    CURRENT MEDICATIONS:  Outpatient Encounter Medications as of 08/17/2023  Medication Sig   Acetylcysteine (NAC 600 PO) Take 1,200 mg by mouth daily.   Carnitine-B5-B6 (L-CARNITINE 500 PO) Take 1,000 mg by mouth daily.   ibuprofen (ADVIL) 800 MG tablet Take 800 mg by mouth.   lithium  carbonate 300 MG capsule Take 2  capsules (600 mg total) by mouth daily.   [START ON 08/22/2023] methylphenidate  27 MG PO CR tablet Take 1 tablet (27 mg total) by mouth every morning.   [START ON 09/19/2023] methylphenidate  27 MG PO CR tablet Take 1 tablet (27 mg total) by mouth every morning.   methylphenidate  27 MG PO CR tablet Take 1 tablet (27 mg total) by mouth every morning.   propranolol  (INDERAL ) 10 MG tablet Take 2-4 tablets by mouth twice daily as needed for tremor (Patient taking differently: Take 10 mg by mouth daily. Take 2-4 tablets by mouth twice daily as needed for tremor)   Pyridoxine HCl (VITAMIN B6) 100 MG TABS Take 500 mg by mouth daily.   QUEtiapine  (SEROQUEL ) 300 MG tablet Take 1 tablet (300 mg total) by mouth at bedtime.   sildenafil (VIAGRA) 100 MG tablet TAKE 1 TABLET BY MOUTH AS NEEDED AS DIRECTED   Vitamin D , Ergocalciferol , (DRISDOL ) 1.25 MG (50000 UNIT) CAPS capsule TAKE 1 CAPSULE BY MOUTH ONCE WEEKLY   Facility-Administered Encounter Medications as of 08/17/2023  Medication   0.9 %  sodium chloride  infusion    Objective:   PHYSICAL EXAMINATION:    VITALS:  There were no vitals filed for this visit.  GEN:  Normal appears male in no acute distress.  Appears stated age. HEENT:  Normocephalic, atraumatic. The mucous membranes  are moist. The superficial temporal arteries are without ropiness or tenderness. Cardiovascular: Regular rate and rhythm. Lungs: Clear to auscultation bilaterally. Neck/Heme: There are no carotid bruits noted bilaterally.  NEUROLOGICAL: Orientation:  The patient is alert and oriented x 3.   Cranial nerves: There is good facial symmetry.  Extraocular muscles are intact and visual fields are full to confrontational testing. Speech is fluent and clear. Soft palate rises symmetrically and there is no tongue deviation. Hearing is intact to conversational tone. Tone: Tone is good throughout. Sensation: Sensation is intact to light touch and pinprick throughout (facial, trunk,  extremities). Vibration is intact at the bilateral big toe. There is no extinction with double simultaneous stimulation. There is no sensory dermatomal level identified. Coordination:  The patient has no difficulty with RAM's or FNF bilaterally. Motor: Strength is 5/5 in the bilateral upper and lower extremities.  Shoulder shrug is equal and symmetric. There is no pronator drift.  There are no fasciculations noted. DTR's: Deep tendon reflexes are 2/4 at the bilateral biceps, triceps, brachioradialis, patella and achilles.  Plantar responses are downgoing bilaterally. Gait and Station: The patient is able to ambulate without difficulty. The patient is able to heel toe walk without any difficulty. The patient is able to ambulate in a tandem fashion. The patient is able to stand in the Romberg position.    Total time spent on today's visit was ***greater than 60 minutes, including both face-to-face time and nonface-to-face time.  Time included that spent on review of records (prior notes available to me/labs/imaging if pertinent), discussing treatment and goals, answering patient's questions and coordinating care.   Cc:  Seabron Lenis, MD

## 2023-08-17 ENCOUNTER — Ambulatory Visit: Admitting: Neurology

## 2023-08-17 ENCOUNTER — Encounter: Payer: Self-pay | Admitting: Neurology

## 2023-08-17 ENCOUNTER — Encounter: Payer: Self-pay | Admitting: Psychology

## 2023-08-17 VITALS — BP 106/72 | HR 85 | Ht 69.0 in | Wt 187.0 lb

## 2023-08-17 DIAGNOSIS — R413 Other amnesia: Secondary | ICD-10-CM

## 2023-08-17 DIAGNOSIS — G251 Drug-induced tremor: Secondary | ICD-10-CM | POA: Diagnosis not present

## 2023-08-17 NOTE — Patient Instructions (Addendum)
 A referral to [facility] has been placed for your MRI someone will contact you directly to schedule your appt. They are located at [address] [facility]. Please contact them directly by calling [phone] with any questions regarding your referral.   You have been referred for a neurocognitive evaluation (i.e., evaluation of memory and thinking abilities). Please bring someone with you to this appointment if possible, as it is helpful for the neuropsychologist to hear from both you and another adult who knows you well. Please bring eyeglasses and hearing aids if you wear them and take any medications as you normally would. Please fully abstain from all alcohol, marijuana, or other substances prior to your appointment.   The evaluation will take approximately 2-3 hours and has two parts:   The first part is a clinical interview with the neuropsychologist, Dr. Richie or Dr. Gayland. During the interview, the neuropsychologist will speak with you and the individual you brought to the appointment.    The second part of the evaluation is testing with the doctor's technician, aka psychometrician, Dana or [employer]. During the testing, the technician will ask you to remember different types of material, solve problems, and answer some questionnaires. Your family member will not be present for this portion of the evaluation.   Please note: We have to reserve several hours of the neuropsychologist's time and the psychometrician's time for your evaluation appointment. As such, there is a No-Show fee of $100. If you are unable to attend any of your appointments, please contact our office as soon as possible to reschedule.

## 2023-08-19 ENCOUNTER — Encounter: Payer: Self-pay | Admitting: Psychology

## 2023-08-19 DIAGNOSIS — I8390 Asymptomatic varicose veins of unspecified lower extremity: Secondary | ICD-10-CM | POA: Insufficient documentation

## 2023-08-19 DIAGNOSIS — N400 Enlarged prostate without lower urinary tract symptoms: Secondary | ICD-10-CM | POA: Insufficient documentation

## 2023-08-19 DIAGNOSIS — R7303 Prediabetes: Secondary | ICD-10-CM | POA: Insufficient documentation

## 2023-08-19 DIAGNOSIS — R972 Elevated prostate specific antigen [PSA]: Secondary | ICD-10-CM | POA: Insufficient documentation

## 2023-08-19 DIAGNOSIS — C44320 Squamous cell carcinoma of skin of unspecified parts of face: Secondary | ICD-10-CM | POA: Insufficient documentation

## 2023-08-19 DIAGNOSIS — F32A Depression, unspecified: Secondary | ICD-10-CM | POA: Insufficient documentation

## 2023-08-19 DIAGNOSIS — B351 Tinea unguium: Secondary | ICD-10-CM | POA: Insufficient documentation

## 2023-08-19 DIAGNOSIS — R739 Hyperglycemia, unspecified: Secondary | ICD-10-CM | POA: Insufficient documentation

## 2023-08-19 HISTORY — DX: Benign prostatic hyperplasia without lower urinary tract symptoms: N40.0

## 2023-08-22 ENCOUNTER — Encounter: Payer: Self-pay | Admitting: Psychology

## 2023-08-22 ENCOUNTER — Ambulatory Visit: Payer: Self-pay | Admitting: Psychology

## 2023-08-22 ENCOUNTER — Ambulatory Visit: Admitting: Psychology

## 2023-08-22 DIAGNOSIS — G3184 Mild cognitive impairment, so stated: Secondary | ICD-10-CM

## 2023-08-22 DIAGNOSIS — R4189 Other symptoms and signs involving cognitive functions and awareness: Secondary | ICD-10-CM

## 2023-08-22 DIAGNOSIS — F0670 Mild neurocognitive disorder due to known physiological condition without behavioral disturbance: Secondary | ICD-10-CM

## 2023-08-22 NOTE — Progress Notes (Signed)
   Psychometrician Note   Cognitive testing was administered to Brian Guerrero by Lonell Jude, B.S. (psychometrist) under the supervision of Dr. Arthea KYM Maryland, Ph.D., ABPP, licensed psychologist on 08/22/2023. Brian Guerrero did not appear overtly distressed by the testing session per behavioral observation or responses across self-report questionnaires. Rest breaks were offered.    The battery of tests administered was selected by Dr. Zachary C. Merz, Ph.D., ABPP with consideration to Brian Guerrero's current level of functioning, the nature of his symptoms, emotional and behavioral responses during interview, level of literacy, observed level of motivation/effort, and the nature of the referral question. This battery was communicated to the psychometrist. Communication between Dr. Arthea KYM Maryland, Ph.D., ABPP and the psychometrist was ongoing throughout the evaluation and Dr. Arthea KYM Maryland, Ph.D., ABPP was immediately accessible at all times. Dr. Zachary C. Merz, Ph.D., ABPP provided supervision to the psychometrist on the date of this service to the extent necessary to assure the quality of all services provided.    Brian Guerrero will return within approximately 1-2 weeks for an interactive feedback session with Dr. Maryland at which time his test performances, clinical impressions, and treatment recommendations will be reviewed in detail. Brian Guerrero understands he can contact our office should he require our assistance before this time.  A total of 135 minutes of billable time were spent face-to-face with Brian Guerrero by the psychometrist. This includes both test administration and scoring time. Billing for these services is reflected in the clinical report generated by Dr. Arthea KYM Maryland, Ph.D., ABPP  This note reflects time spent with the psychometrician and does not include test scores or any clinical interpretations made by Dr. Maryland. The full report will follow in a separate note.

## 2023-08-22 NOTE — Progress Notes (Unsigned)
 NEUROPSYCHOLOGICAL EVALUATION [facility] [place] Department of Neurology  Date of Evaluation: August 22, 2023  Reason for Referral:   Brian Guerrero is a 73 y.o. right-handed Caucasian male referred by Brian Guerrero, D.O., to characterize his current cognitive functioning and assist with diagnostic clarity and treatment planning in the context of subjective cognitive decline.   Assessment and Plan:   Clinical Impression(s): [name]'s pattern of performance is suggestive of performance variability across both encoding (i.e., learning) and delayed retrieval aspects of memory. A particular weakness was exhibited across retention of a previously learned array of shapes. Performances were appropriate relative to age-matched peers across all other assessed cognitive domains. This includes processing speed, attention/concentration, executive functioning, safety/judgment, receptive and expressive language, visuospatial abilities, and recognition/consolidation aspects of memory. [name] denied difficulties completing instrumental activities of daily living (ADLs) independently. 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 However, they very mild nature of this diagnosis should be emphasized at the present time.   Relative to his previous evaluation in July 2022, a large degree of stability was exhibited. His sole area of decline was across delayed retrieval and retention of a previously learned array of shapes. Subtle decline could be argued across learning aspects of list and shape-based tasks. However, stability was exhibited across learning aspects of story and daily living memory tasks. No change was exhibited across all other assessed domains.   The cause for some mild memory variability remains unclear and is likely multifactorial in nature. During his previous  evaluation, we discussed that, while [name] may not have diagnosable ADHD, he does exhibit traits of this condition. These may be caused by a combination of bipolar disorder and other psychiatric distress, as well as medication side effects. Attentional dysregulation can certainly disrupt memory processes, especially encoding. While he reported minimal anxiety across mood-related questionnaires, he did express acute test anxiety and exhibit anxious behaviors throughout the testing portion of the evaluation. Acute anxiety further compromises attentional abilities. Taken together, this continues to represent the most likely culprit for testing weaknesses and day-to-day dysfunction.   Previous neurological exams have not revealed strong concerns surrounding an underlying parkinsonian condition. Testing continues to not provide any evidence to contradict this. Appropriate performances across executive functioning and visuospatial abilities, coupled with lack of common behavioral features, make Lewy body dementia or other degenerative illnesses on the parkinsonism spectrum unlikely. Despite variability across memory measures, [name] did not demonstrate consistent impairments. Further, performances across recognition/consolidation aspects of memory were consistently appropriately, thus not offering compelling evidence for currently symptomatic Alzheimer's disease at the present time.  Recommendations: [name] is encouraged to attend to lifestyle factors for brain health (e.g., regular physical exercise, good nutrition habits and consideration of the MIND-DASH diet, regular participation in cognitively-stimulating activities, and general stress management techniques), which are likely to have benefits for both emotional adjustment and cognition. In fact, in addition to promoting good general health, regular exercise incorporating aerobic activities (e.g., brisk walking, jogging, cycling, etc.) has been  demonstrated to be a very effective treatment for depression and stress, with similar efficacy rates to both antidepressant medication and psychotherapy. Optimal control of vascular risk factors (including safe cardiovascular exercise and adherence to dietary recommendations) is encouraged. Continued participation in activities which provide mental stimulation and social interaction is also recommended.   If interested, there are some activities which have therapeutic value and can be useful  in keeping him cognitively stimulated. For suggestions, [name] is encouraged to go to the following website: https://www.[facility].org/get-to-know-barrow/centers-programs/neurorehabilitation-center/neuro-rehab-apps-and-games/ which has smart phone/tablet based options. It should be noted that these activities should not be viewed as a substitute for therapy.  Memory can be improved using internal strategies such as rehearsal, repetition, chunking, mnemonics, association, and imagery. External strategies such as written notes in a consistently used memory journal, visual and nonverbal auditory cues such as a calendar on the refrigerator or appointments with alarm, such as on a cell phone, can also help maximize recall.    When learning new information, he would benefit from information being broken up into small, manageable pieces. He may also find it helpful to articulate the material in his own words and in a context to promote encoding at the onset of a new task. This material may need to be repeated multiple times to promote encoding.  Because he shows better recall for structured information, he will likely understand and retain new information better if it is presented to him in a meaningful or well-organized manner at the outset, such as grouping items into meaningful categories or presenting information in an outlined, bulleted, or story format.  To address day-to-day problems with fluctuating attention  and/or executive dysfunction, he may wish to consider:   -Avoiding external distractions when needing to concentrate   -Limiting exposure to fast paced environments with multiple sensory demands   -Writing down complicated information and using checklists   -Attempting and completing one task at a time (i.e., no multi-tasking)   -Verbalizing aloud each step of a task to maintain focus   -Taking frequent breaks during the completion of steps/tasks to avoid fatigue   -Reducing the amount of information considered at one time   -Scheduling more difficult activities for a time of day where he is usually most alert  Review of Records:   [name] completed a comprehensive neuropsychological evaluation with myself on 07/18/2020. Results suggested performance variability and a relative weakness across processing speed, as well as learning and memory (particularly retrieval and recognition/consolidation aspects of memory). Performance across all other domains were appropriate. This included attention/concentration, executive functioning, safety/judgment, receptive and expressive language, and visuospatial abilities. [name] largely denied difficulties completing instrumental activities of daily living (ADLs) independently. At that time, it was plausible that difficulties were related to a combination of psychiatric distress and medication side effects. Repeat testing was recommended as needed.  Past Medical History:  Diagnosis Date   Attention or concentration deficit    Benign prostatic hyperplasia without urinary obstruction 08/19/2023   Bipolar 2 disorder    Cataract    Depression    Diverticular disease    [facility]/per pt not aware of this   Elevated PSA    GAD (generalized anxiety disorder) 10/23/2017   Hyperglycemia    Lithium -induced tremor    Onychomycosis 08/19/2023   Prediabetes    Reticular venous varices    Squamous cell carcinoma of skin of face 08/19/2023   Wears glasses      Past Surgical History:  Procedure Laterality Date   CATARACT EXTRACTION, BILATERAL     COLONOSCOPY     INGUINAL HERNIA REPAIR Right 04/30/2013   Procedure: HERNIA REPAIR INGUINAL ADULT;  Surgeon: Donnice Bury, MD;  Location: [facility];  Service: General;  Laterality: Right;   WISDOM TOOTH EXTRACTION      Current Outpatient Medications:    Acetylcysteine (NAC 600 PO), Take 1,200 mg by mouth daily., Disp: ,  Rfl:    Carnitine-B5-B6 (L-CARNITINE 500 PO), Take 1,000 mg by mouth daily. (Patient not taking: Reported on 08/17/2023), Disp: , Rfl:    ibuprofen (ADVIL) 800 MG tablet, Take 800 mg by mouth., Disp: , Rfl:    lithium  carbonate 300 MG capsule, Take 2 capsules (600 mg total) by mouth daily., Disp: 180 capsule, Rfl: 0   methylphenidate  27 MG PO CR tablet, Take 1 tablet (27 mg total) by mouth every morning., Disp: 30 tablet, Rfl: 0   [START ON 09/19/2023] methylphenidate  27 MG PO CR tablet, Take 1 tablet (27 mg total) by mouth every morning., Disp: 30 tablet, Rfl: 0   methylphenidate  27 MG PO CR tablet, Take 1 tablet (27 mg total) by mouth every morning., Disp: 30 tablet, Rfl: 0   propranolol  (INDERAL ) 10 MG tablet, Take 2-4 tablets by mouth twice daily as needed for tremor, Disp: 540 tablet, Rfl: 0   Pyridoxine HCl (VITAMIN B6) 100 MG TABS, Take 500 mg by mouth daily., Disp: , Rfl:    QUEtiapine  (SEROQUEL ) 300 MG tablet, Take 1 tablet (300 mg total) by mouth at bedtime., Disp: 90 tablet, Rfl: 1   sildenafil (VIAGRA) 100 MG tablet, TAKE 1 TABLET BY MOUTH AS NEEDED AS DIRECTED (Patient not taking: Reported on 08/17/2023), Disp: 30 tablet, Rfl: 11   Vitamin D , Ergocalciferol , (DRISDOL ) 1.25 MG (50000 UNIT) CAPS capsule, TAKE 1 CAPSULE BY MOUTH ONCE WEEKLY, Disp: 15 capsule, Rfl: 1  Current Facility-Administered Medications:    0.9 %  sodium chloride  infusion, 500 mL, Intravenous, Once, Nandigam, Kavitha V, MD       08/17/2023   10:00 AM  Montreal Cognitive Assessment    Visuospatial/ Executive (0/5) 4  Naming (0/3) 3  Attention: Read list of digits (0/2) 2  Attention: Read list of letters (0/1) 1  Attention: Serial 7 subtraction starting at 100 (0/3) 3  Language: Repeat phrase (0/2) 2  Language : Fluency (0/1) 1  Abstraction (0/2) 2  Delayed Recall (0/5) 4  Orientation (0/6) 6  Total 28   Neuroimaging: No neuroimaging was available for my review.  Clinical Interview:   The following information was obtained during a clinical interview with [name] prior to cognitive testing.  Cognitive Symptoms: Decreased short-term memory: Endorsed. Previously, primary examples included not recalling details of past events, activities, or conversations. He reported some trouble recalling names, but minimized these difficulties relative to others. Memory concerns were said to be present for the past 10+ years. Similar examples were reported currently. He reported his perception of stability in memory abilities relative to his prior July 2022 evaluation.  Decreased long-term memory: Denied. Decreased attention/concentration: Endorsed. Previously, he reported longstanding trouble with sustained attention, distractibility, and losing his train of thought. Difficulties were said to extend back many years and his wife had noted they always wondered if he has undiagnosed ADHD. Per his psychiatrist, there was expressed concern that psychiatric illness created a situation with symptoms mimicking inattention. Currently, he reported stable attentional dysregulation, perhaps with some improvement since starting methylphenidate .  Reduced processing speed: Denied. Difficulties with executive functions: Endorsed. Previously, he reported longstanding difficulties with organization and indecision. This was stable. He denied trouble with impulsivity or any significant personality changes.  Difficulties with emotion regulation: Denied. Difficulties with receptive language:  Denied. Difficulties with word finding: Endorsed occasionally.  Decreased visuoperceptual ability: Denied.   Difficulties completing ADLs: Denied. He did acknowledge a few isolated instances where he mistakenly drove to the phone physician's office. No other navigational  concerns while driving were reported.   Additional Medical History: History of traumatic brain injury/concussion: Unclear. Approximately 5-6 years prior, he reported falling while hiking. Per Dr. Collie notes, he reported stating I just mis-stepped but I really went down. At that time, he agreed that it was a fall anyone could have taken. He previously described this incident when asked about prior head injuries but did not specify hitting his head or being knocked unconscious. No other potential head injuries were reported.  History of stroke: Denied. History of seizure activity: Denied. History of known exposure to toxins: Denied. Symptoms of chronic pain: Denied. Experience of frequent headaches/migraines: Denied. Frequent instances of dizziness/vertigo: Denied.  Sensory changes: He had cataract surgery in the past and noted no visual acuity concerns at the present time. Other sensory changes/difficulties (e.g., hearing, taste, or smell) were denied.  Balance/coordination difficulties: He previously reported a somewhat longstanding history of being clumsy. However, he denied any lateralized weakness, recent recent falls, or the presence of any paraesthesias currently. Other motor difficulties: Historically, he reported significant tremulous symptoms in his upper extremities bilaterally. However, since starting propranolol , these symptoms have notably diminished to the extent where he stated that they generally are not noticeable outside of when he is writing/drawing. Currently, symptoms were said to be much improved.   Sleep History: Estimated hours obtained each night: 6-8 hours. He described his sleep as good overall.   Difficulties falling asleep: Denied. Medications are helpful in this regard.  Difficulties staying asleep: Denied. Feels rested and refreshed upon awakening: Endorsed.   History of snoring: Endorsed. His wife reported mild snoring behaviors, largely position dependant.  History of waking up gasping for air: Denied. Witnessed breath cessation while asleep: Denied.   History of vivid dreaming: Denied. Excessive movement while asleep: Denied. Instances of acting out his dreams: Denied.  Psychiatric/Behavioral Health History: Depression: He acknowledged a history of depressive episodes associated with his prior diagnosis of bipolar disorder. His last episode was said to be around three years in the past. He described his current mood as up and down but also noted that he has felt pretty well and that his mood has been moving in a positive direction. Current or remote suicidal ideation, intent, or plan was denied.  Anxiety: Denied. Mania: Endorsed (see above).  Trauma History: Denied. Visual/auditory hallucinations: Denied. Delusional thoughts: Denied.   Tobacco: Denied. Alcohol: He reported consuming generally one beer each night. He denied a history of problematic alcohol abuse or dependence.  Recreational drugs: Denied.  Family History: Problem Relation Age of Onset   Depression Mother    Prostate cancer Father    Kidney disease Father    Dementia Father        unspecified type; symptom onset in late 80s/early 59s   Tremor Father    Parkinson's disease Sister    Colon cancer Neg Hx    Rectal cancer Neg Hx    Stomach cancer Neg Hx    Esophageal cancer Neg Hx    This information was confirmed by [name].  Academic/Vocational History: Highest level of educational attainment: 17 years. 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 No  relative weaknesses were identified.  History of developmental delay: Denied. History of grade repetition: Denied. Enrollment in special education courses: Denied. History of LD/ADHD: Denied.   Employment: Retired. He previously worked as an Charity fundraiser.   Evaluation  Results:   Behavioral Observations: [name] was unaccompanied, arrived to his appointment on time, and was appropriately dressed and groomed. He appeared alert. Observed gait and station were within normal limits. Gross motor functioning appeared intact upon informal observation and no abnormal movements (e.g., tremors) were noted. His affect was generally relaxed and positive. Spontaneous speech was fluent and word finding difficulties were not observed during the clinical interview. Thought processes were coherent, organized, and normal in content. Insight into his cognitive difficulties appeared adequate.   During testing, sustained attention was appropriate. Task engagement was adequate and he persisted when challenged. Overall, [name] was cooperative with the clinical interview and subsequent testing procedures.   Adequacy of Effort: The validity of neuropsychological testing is limited by the extent to which the individual being tested may be assumed to have exerted adequate effort during testing. [name] expressed his intention to perform to the best of his abilities and exhibited adequate task engagement and persistence. Scores across stand-alone and embedded performance validity measures were within expectation. As such, the results of the current evaluation are believed to be a valid representation of [name] current cognitive functioning.  Test Results: [name] was fully oriented at the time of the current evaluation.  Intellectual abilities based upon educational and vocational attainment were estimated to be in the average range. Premorbid abilities were estimated to be within the well above average range  based upon a single-word reading test.   Processing speed was average to above average. Basic attention was average. More complex attention (e.g., working memory) was above average. Executive functioning was average to well above average. He also scored in the exceptionally high range across a task assessing safety and judgment.   Assessed receptive language abilities were average. Likewise, [name] did not exhibit any difficulties comprehending task instructions and answered all questions asked of him appropriately. Assessed expressive language (e.g., verbal fluency and confrontation naming) was above average to exceptionally high.     Assessed visuospatial/visuoconstructional abilities were average to well above average.    Learning (i.e., encoding) of novel verbal and visual information was variable, ranging from the well below average to average normative ranges. Spontaneous delayed recall (i.e., retrieval) of previously learned information was also variable, ranging from the exceptionally low to average normative ranges. Retention rates were 80% across a list learning task, 97% across a story learning task, 82% across a daily living task, and 29% across a shape learning task. Performance across recognition tasks was below average to above average, suggesting evidence for information consolidation.   Results of emotional screening instruments suggested that recent symptoms of generalized anxiety were in the minimal range, while symptoms of depression were within normal limits. A screening instrument assessing recent sleep quality suggested the presence of minimal sleep dysfunction.  Table of Scores:   Note: This summary of test scores accompanies the interpretive report and should not be considered in isolation without reference to the appropriate sections in the text. Descriptors are based on appropriate normative data and may be adjusted based on clinical judgment. Terms such as Within  Normal Limits and Outside Normal Limits are used when a more specific description of the test score cannot be determined. Descriptors refer to the current evaluation only.        Percentile - Normative Descriptor > 98 - Exceptionally High 91-97 - Well Above Average 75-90 - Above Average 25-74 - Average 9-24 - Below Average 2-8 - Well Below Average < 2 - Exceptionally Low  Validity: July 2022 Current  DESCRIPTOR        DCT: --- --- --- Within Normal Limits  NAB EVI: --- --- --- Within Normal Limits        Orientation:       Raw Score Raw Score Percentile   NAB Orientation, Form 1 28/29 29/29 --- ---        Cognitive Screening:       Raw Score Raw Score Percentile   SLUMS: 29/30 29/30 --- ---        Intellectual Functioning:       Standard Score Standard Score Percentile   Test of Premorbid Functioning: 119 125 95 Well Above Average        Memory:      NAB Memory Module, Form 1: Standard Score/ T Score Standard Score/ T Score Percentile   Total Memory Index 82 84 14 Below Average  List Learning        Total Trials 1-3 21/36 (46) 16/36 (36) 8 Well Below Average    List B 2/12 (34) 5/12 (55) 69 Average    Short Delay Free Recall 3/12 (30) 5/12 (39) 14 Below Average    Long Delay Free Recall 0/12 (23) 4/12 (35) 7 Well Below Average    Retention Percentage 0 (22) 80 (46) 34 Average    Recognition Discriminability 5 (45) 4 (42) 21 Below Average  Shape Learning        Total Trials 1-3 17/27 (55) 14/27 (46) 34 Average    Delayed Recall 4/9 (40) 2/9 (25) 1 Exceptionally Low    Retention Percentage 57 (38) 29 (30) 2 Well Below Average    Recognition Discriminability 4 (36) 7 (52) 58 Average  Story Learning        Immediate Recall 50/80 (40) 53/80 (42) 21 Below Average    Delayed Recall 23/40 (36) 34/40 (54) 66 Average    Retention Percentage 77 (43) 97 (52) 58 Average  Daily Living Memory        Immediate Recall 45/51 (56) 44/51 (54) 66 Average    Delayed Recall 14/17  (50) 14/17 (50) 50 Average    Retention Percentage 82 (48) 82 (48) 42 Average    Recognition Hits 9/10 (53) 10/10 (61) 86 Above Average        Attention/Executive Function:      Trail Making Test (TMT): Raw Score (T Score) Raw Score (T Score) Percentile     Part A 48 secs.,  0 errors (38) 36 secs.,  0 errors (47) 38 Average    Part B 57 secs.,  0 errors (60) 47 secs.,  0 errors (68) 96 Well Above Average         Symbol Digit Modalities Test (SDMT): Raw Score Raw Score (Z-Score) Percentile     Oral 54 44 (-0.65) 26 Average        NAB Attention Module, Form 1: T Score T Score Percentile     Digits Forward 58 54 66 Average    Digits Backwards 54 58 79 Above Average         Scaled Score Scaled Score Percentile   WAIS-IV Similarities: 13 12 75 Above Average        D-KEFS Color-Word Interference Test: Raw Score (Scaled Score) Raw Score (Scaled Score) Percentile     Color Naming 24 secs. (14) 28 secs. (12) 75 Above Average    Word Reading 18 secs. (14) 22 secs. (12) 75 Above Average    Inhibition 46 secs. (14)  39 secs. (15) 95 Well Above Average      Total Errors 2 errors (11) 1 error (12) 75 Above Average    Inhibition/Switching 48 secs. (14) 52 secs. (14) 91 Well Above Average      Total Errors 1 error (12) 0 errors (13) 84 Above Average        D-KEFS Verbal Fluency Test: Raw Score (Scaled Score) Raw Score (Scaled Score) Percentile     Letter Total Correct --- 58 (17) 99 Exceptionally High    Category Total Correct --- 43 (14) 91 Well Above Average    Category Switching Total Correct --- 11 (9) 37 Average    Category Switching Accuracy --- 8 (7) 16 Below Average      Total Set Loss Errors --- 5 (6) 9 Below Average      Total Repetition Errors --- 2 (11) 63 Average        NAB Executive Functions Module, Form 1: T Score T Score Percentile     Judgment 48 71 98 Exceptionally High        Language:      Verbal Fluency Test: Raw Score (T Score) Raw Score (T Score) Percentile      Phonemic Fluency (FAS) 64 (70) 58 (66) 95 Well Above Average    Animal Fluency 25 (64) 23 (60) 84 Above Average         NAB Language Module, Form 1: T Score T Score Percentile     Auditory Comprehension --- 56 73 Average    Naming 31/31 (57) 31/31 (57) 75 Above Average        Visuospatial/Visuoconstruction:       Raw Score Raw Score Percentile   Clock Drawing: 10/10 10/10 --- Within Normal Limits        NAB Spatial Module, Form 1: T Score T Score Percentile     Figure Drawing Copy 60 65 93 Well Above Average         Scaled Score Scaled Score Percentile   WAIS-IV Block Design: 12 13 84 Above Average        Mood and Personality:       Raw Score Raw Score Percentile   Geriatric Depression Scale: 10 7 --- Within Normal Limits  Geriatric Anxiety Scale: 17 4 --- Minimal    Somatic 6 1 --- Minimal    Cognitive 4 2 --- Minimal    Affective 7 1 --- Minimal        Additional Questionnaires:       Raw Score Raw Score Percentile   PROMIS Sleep Disturbance Questionnaire: 14 12 --- None to Slight   Informed Consent and Coding/Compliance:   The current evaluation represents a clinical evaluation for the purposes previously outlined by the referral source and is in no way reflective of a forensic evaluation.   [name] was provided with a verbal description of the nature and purpose of the present neuropsychological evaluation. Also reviewed were the foreseeable risks and/or discomforts and benefits of the procedure, limits of confidentiality, and mandatory reporting requirements of this provider. The patient was given the opportunity to ask questions and receive answers about the evaluation. Oral consent to participate was provided by the patient.   This evaluation was conducted by Arthea KYM Brian Guerrero, Ph.D., ABPP-CN, board certified clinical neuropsychologist. [name] completed a clinical interview with Dr. [place], billed as one unit [phone], and 135 minutes of cognitive testing and scoring,  billed as one unit [phone] and four additional units 96139. Psychometrist  Brian Guerrero, B.S. assisted Dr. Richie with test administration and scoring procedures. As a separate and discrete service, one unit [phone] and two units 96133 (160 minutes) were billed for Dr. Loralee time spent in interpretation and report writing.

## 2023-08-24 ENCOUNTER — Encounter: Payer: Self-pay | Admitting: Neurology

## 2023-08-25 ENCOUNTER — Ambulatory Visit
Admission: RE | Admit: 2023-08-25 | Discharge: 2023-08-25 | Disposition: A | Discharge status: Home / Self Care | Source: Ambulatory Visit | Attending: Neurology | Admitting: Neurology

## 2023-08-29 ENCOUNTER — Ambulatory Visit (INDEPENDENT_AMBULATORY_CARE_PROVIDER_SITE_OTHER): Admitting: Psychology

## 2023-08-29 DIAGNOSIS — R41840 Attention and concentration deficit: Secondary | ICD-10-CM | POA: Diagnosis not present

## 2023-08-29 NOTE — Progress Notes (Signed)
   Neuropsychology Feedback Session Brian Guerrero. [facility] [place] Department of Neurology  Reason for Referral:   Brian Guerrero is a 73 y.o. right-handed Caucasian male referred by Asberry Schneider, D.O., to characterize his current cognitive functioning and assist with diagnostic clarity and treatment planning in the context of subjective cognitive decline.   Feedback:   [name] completed a comprehensive neuropsychological evaluation on 08/22/2023. Please refer to that encounter for the full report and recommendations. Briefly, results suggested performance variability across both encoding (i.e., learning) and delayed retrieval aspects of memory. A particular weakness was exhibited across retention of a previously learned array of shapes. Performances were appropriate relative to age-matched peers across all other assessed cognitive domains. This includes processing speed, attention/concentration, executive functioning, safety/judgment, receptive and expressive language, visuospatial abilities, and recognition/consolidation aspects of memory. Relative to his previous evaluation in July 2022, a large degree of stability was exhibited. His sole area of decline was across delayed retrieval and retention of a previously learned array of shapes. No change was exhibited across all other assessed domains. The cause for some mild memory variability remains unclear and is likely multifactorial in nature. During his previous evaluation, we discussed that, while Brian Guerrero may not have diagnosable ADHD based on medical criteria, he does exhibit traits of this condition. These may be caused by a combination of bipolar disorder and other psychiatric distress, as well as medication side effects. Attentional dysregulation can certainly disrupt memory processes, especially encoding. While he reported minimal anxiety across mood-related questionnaires, he did express acute test anxiety and exhibited  anxious behaviors throughout the testing portion of the evaluation. Acute anxiety would further compromise attentional abilities. Taken together, this continues to represent the most likely culprit for testing weaknesses and day-to-day dysfunction.   Brian Guerrero was accompanied by his wife during the current feedback session. Content of the current session focused on the results of his neuropsychological evaluation. Brian Guerrero was given the opportunity to ask questions and his questions were answered. He was encouraged to reach out should additional questions arise. A copy of his report was provided at the conclusion of the visit.      One unit 96132 (31 minutes) was billed for Dr. Loralee time spent preparing for, conducting, and documenting the current feedback session with Brian Guerrero.

## 2023-08-30 ENCOUNTER — Ambulatory Visit: Admitting: Mental Health

## 2023-08-30 DIAGNOSIS — F411 Generalized anxiety disorder: Secondary | ICD-10-CM

## 2023-08-30 NOTE — Progress Notes (Signed)
 [facility] Psychotherapy Note  Name: LENELL LAMA Date: 08/30/2023 MRN: [id number] DOB: Jul 13, 1950 PCP: Seabron Lenis, MD  Time spent:  51 minutes  Treatment:    ind. therapy  Mental Status Exam:    Appearance:    Casual     Behavior:   Appropriate  Motor:   WNL  Speech/Language:    Clear and Coherent  Affect:   Full range   Mood:   Anxious, pleasant  Thought process:   Logical, linear, goal directed  Thought content:     WNL  Sensory/Perceptual disturbances:     none  Orientation:   x4  Attention:   Good  Concentration:   Good  Memory:   Intact  Fund of knowledge:    Consistent with age and development  Insight:     Good  Judgment:    Good  Impulse Control:   Good   Reported Symptoms:  anxiety    Risk Assessment: Danger to Self:  No Self-injurious Behavior: No Danger to Others: No Duty to Warn:no Physical Aggression / Violence:No  Access to Firearms a concern: No  Gang Involvement:No  Patient / guardian was educated about steps to take if suicide or homicide risk level increases between visits: yes While future psychiatric events cannot be accurately predicted, the patient does not currently require acute inpatient psychiatric care and does not currently meet [place]  involuntary commitment criteria.     Subjective:  Patient arrived on time for today's session.  He shared recent events, how he and his wife are able to attend their vacation cruise as discussed in previous visits.  He shared how he wanted this vacation initially to be a breakthrough moment for himself and as a couple.  He stated that he had initial arguments but were able to have a more meaningful discussion after a few days.  He stated that he was upset at 1 point due to his wife correcting him in front of others.  He stated that he felt later that this was hurtful and cruel.  He went on to share how they were able to talk through the issue and he felt more supported and  understood.  He stated that he has been able to abstain from any doing of podagra for a, identifying further how he is reliant on his faith sharing more specifics.  Patient was encouraged to continue to utilize his faith-based reminder as identified in session as he stated his has been helpful and effective.  He stated they continue to ready their house for sale, they are other him and Mallie [place] to be completed in October with their plans to move at that point.   Interventions: Motivational interviewing, supportive therapy, CBT  Diagnoses:    ICD-10-CM   1. Generalized anxiety disorder  F41.1         ADHD By history  Plan of Care: Patient to follow through with coping as discussed in session.  Patient to continue to utilize his support system.   Lonni Fischer, [facility]

## 2023-08-31 ENCOUNTER — Other Ambulatory Visit

## 2023-09-06 ENCOUNTER — Ambulatory Visit: Payer: Self-pay | Admitting: Neurology

## 2023-09-12 ENCOUNTER — Ambulatory Visit: Admitting: Mental Health

## 2023-09-12 DIAGNOSIS — F411 Generalized anxiety disorder: Secondary | ICD-10-CM | POA: Diagnosis not present

## 2023-09-12 NOTE — Progress Notes (Unsigned)
 [facility] Psychotherapy Note  Name: EDRAS WILFORD Date: 09/12/2023 MRN: [id number] DOB: [birth year] PCP: Seabron Lenis, MD  Time spent:  51 minutes  Treatment:   Family therapy with patient  Mental Status Exam:    Appearance:    Casual     Behavior:   Appropriate  Motor:   WNL  Speech/Language:    Clear and Coherent  Affect:   Full range   Mood:   Anxious, pleasant  Thought process:   Logical, linear, goal directed  Thought content:     WNL  Sensory/Perceptual disturbances:     none  Orientation:   x4  Attention:   Good  Concentration:   Good  Memory:   Intact  Fund of knowledge:    Consistent with age and development  Insight:     Good  Judgment:    Good  Impulse Control:   Good    Reported Symptoms:  anxiety    Risk Assessment: Danger to Self:  No Self-injurious Behavior: No Danger to Others: No Duty to Warn:no Physical Aggression / Violence:No  Access to Firearms a concern: No  Gang Involvement:No  Patient / guardian was educated about steps to take if suicide or homicide risk level increases between visits: yes While future psychiatric events cannot be accurately predicted, the patient does not currently require acute inpatient psychiatric care and does not currently meet [place]  involuntary commitment criteria.     Subjective:  Patient arrived on time for today's session.  He verbally consented to having his wife join today's session.  She shared how she recognizes they have some relational stress, they have been trying to sell their home for the past couple of months after spending a considerable amount of time and money readying it for sale.  He shared how he often can feel like a failure if he is not completing tasks the way that with which his wife would like, going on to share daily tasks that ranged in significance.  She shared how she knows that he is sensitive and she has been working on how she communicates.  She stated that he is  more often concerned about money and spending and how this can sometimes lead to arguments.  They both expressed being committed to working on the relationship.  He identified further that he wants to feel more fulfilled in a paternal role in their relationship, to be more supported and validated by her and how this impacts his feelings.  They both agreed to make more of an effort to communicate with 1 another about these feelings, albeit difficult to process at times.    Interventions: Motivational interviewing, supportive therapy, CBT  Diagnoses:  No diagnosis found.     ADHD By history  Plan of Care: Patient to follow through with coping as discussed in session.  Patient to continue to utilize his support system.   Lonni Fischer, [facility]

## 2023-09-26 ENCOUNTER — Ambulatory Visit: Admitting: Mental Health

## 2023-09-27 ENCOUNTER — Telehealth: Payer: Self-pay | Admitting: Psychiatry

## 2023-09-27 ENCOUNTER — Ambulatory Visit: Admitting: Neurology

## 2023-09-27 DIAGNOSIS — F319 Bipolar disorder, unspecified: Secondary | ICD-10-CM

## 2023-09-27 MED ORDER — QUETIAPINE FUMARATE 300 MG PO TABS: 300.0000 mg | ORAL_TABLET | Freq: Every day | ORAL | 0 refills | Status: DC

## 2023-09-27 NOTE — Telephone Encounter (Signed)
 Pt called and said that he didn't pack enough of his seroquel  300 mg. He is in [employer]. [facility] [address] [place], [place]. Phone number is [phone]

## 2023-09-27 NOTE — Telephone Encounter (Signed)
 Sent!

## 2023-10-04 ENCOUNTER — Ambulatory Visit: Admitting: Mental Health

## 2023-10-10 ENCOUNTER — Ambulatory Visit: Admitting: Mental Health

## 2023-10-10 DIAGNOSIS — F411 Generalized anxiety disorder: Secondary | ICD-10-CM

## 2023-10-10 DIAGNOSIS — F319 Bipolar disorder, unspecified: Secondary | ICD-10-CM

## 2023-10-10 NOTE — Progress Notes (Signed)
 [facility] Psychotherapy Note  Name: Brian Guerrero Date: 10/10/2023 MRN: [id number] DOB: June 24, 1950 PCP: Seabron Lenis, MD  Time spent:  50 minutes  Treatment:   Family therapy with patient  Mental Status Exam:    Appearance:    Casual     Behavior:   Appropriate  Motor:   WNL  Speech/Language:    Clear and Coherent  Affect:   Full range   Mood:   Anxious, pleasant  Thought process:   Logical, linear, goal directed  Thought content:     WNL  Sensory/Perceptual disturbances:     none  Orientation:   x4  Attention:   Good  Concentration:   Good  Memory:   Intact  Fund of knowledge:    Consistent with age and development  Insight:     Good  Judgment:    Good  Impulse Control:   Good    Reported Symptoms:  anxiety    Risk Assessment: Danger to Self:  No Self-injurious Behavior: No Danger to Others: No Duty to Warn:no Physical Aggression / Violence:No  Access to Firearms a concern: No  Gang Involvement:No  Patient / guardian was educated about steps to take if suicide or homicide risk level increases between visits: yes While future psychiatric events cannot be accurately predicted, the patient does not currently require acute inpatient psychiatric care and does not currently meet [place]  involuntary commitment criteria.     Subjective:  Patient arrived on time for today's session.  Assess progress where he shared how he and his wife continue to try and sell their home. He said that it's been somewhat discouraging but he remains hopeful it will sell over the next few months. They continue to plan to move out of state to their new home. He stated that he has a Proofreader working on the home and his trying to stay in contact consistently with him to ensure that plans are being followed through on. He shared more history related to some of his impulsive behaviors related to pornography. He went on to share how he's been communicating recently with a woman  online. He said this a paid service of communication. Discuss using caution when communicating and safety when communicating with others online. Explored unmet needs where he stated that he wanted to talk with someone, further sharing how his mood has been more elevated recently, feeling more excitement and compelled to want to engage in the behavior he stated he plans to discontinue the communication in the next few days.  Interventions: Motivational interviewing, supportive therapy, CBT  Diagnoses:    ICD-10-CM   1. Generalized anxiety disorder  F41.1     2. Bipolar I disorder (HCC)  F31.9       ADHD By history  Plan of Care: Patient to follow through with coping as discussed in session.  Patient to continue to utilize his support system.   Lonni Fischer, [facility]

## 2023-10-19 ENCOUNTER — Ambulatory Visit: Admitting: Mental Health

## 2023-10-19 DIAGNOSIS — F411 Generalized anxiety disorder: Secondary | ICD-10-CM

## 2023-10-19 NOTE — Progress Notes (Signed)
 [facility] Psychotherapy Note  Name: QUANAH MAJKA Date: 10/19/2023 MRN: [id number] DOB: Feb 01, 1950 PCP: Seabron Lenis, MD  Time spent:  51 minutes  Treatment:  Individual therapy  Mental Status Exam:    Appearance:    Casual     Behavior:   Appropriate  Motor:   WNL  Speech/Language:    Clear and Coherent  Affect:   Full range   Mood:   Anxious, pleasant  Thought process:   Logical, linear, goal directed  Thought content:     WNL  Sensory/Perceptual disturbances:     none  Orientation:   x4  Attention:   Good  Concentration:   Good  Memory:   Intact  Fund of knowledge:    Consistent with age and development  Insight:     Good  Judgment:    Good  Impulse Control:   Good    Reported Symptoms:  anxiety    Risk Assessment: Danger to Self:  No Self-injurious Behavior: No Danger to Others: No Duty to Warn:no Physical Aggression / Violence:No  Access to Firearms a concern: No  Gang Involvement:No  Patient / guardian was educated about steps to take if suicide or homicide risk level increases between visits: yes While future psychiatric events cannot be accurately predicted, the patient does not currently require acute inpatient psychiatric care and does not currently meet [place]  involuntary commitment criteria.     Subjective:  Patient arrived on time for today's session.  Patient shared recent events, how he continues to talk with another woman online.  Time spent the session to further explore needs being met where he he was able to share thoughts and feelings related.  He stated that he had requested that she speak to him in a demeaning manner and how this was gratifying for him.  This was explored collaboratively, what made this be a need for him.  Through further guided discovery, he identified how he feels it reflects how he feels about himself, how this is resonated for many years and different aspects of his life.  How this is and complex with  his religious faith was also explored.  At this point, he is uncertain if he will continue to talk with her.  Provide support and understanding throughout, facilitated his having these unmet needs for filled, specifically the demeaning comments made a negative way about him, related to his core beliefs and motivation for change.    Interventions: Motivational interviewing, supportive therapy, CBT  Diagnoses:  No diagnosis found.   ADHD By history  Plan of Care: Patient to follow through with coping as discussed in session.  Patient to continue to utilize his support system.   Lonni Fischer, [facility]

## 2023-11-02 ENCOUNTER — Encounter: Payer: Self-pay | Admitting: Psychiatry

## 2023-11-02 ENCOUNTER — Ambulatory Visit: Admitting: Psychiatry

## 2023-11-02 DIAGNOSIS — Z1321 Encounter for screening for nutritional disorder: Secondary | ICD-10-CM

## 2023-11-02 DIAGNOSIS — F900 Attention-deficit hyperactivity disorder, predominantly inattentive type: Secondary | ICD-10-CM | POA: Diagnosis not present

## 2023-11-02 DIAGNOSIS — F5105 Insomnia due to other mental disorder: Secondary | ICD-10-CM

## 2023-11-02 DIAGNOSIS — F319 Bipolar disorder, unspecified: Secondary | ICD-10-CM | POA: Diagnosis not present

## 2023-11-02 DIAGNOSIS — F411 Generalized anxiety disorder: Secondary | ICD-10-CM | POA: Diagnosis not present

## 2023-11-02 DIAGNOSIS — G251 Drug-induced tremor: Secondary | ICD-10-CM

## 2023-11-02 DIAGNOSIS — E559 Vitamin D deficiency, unspecified: Secondary | ICD-10-CM

## 2023-11-02 DIAGNOSIS — R7989 Other specified abnormal findings of blood chemistry: Secondary | ICD-10-CM

## 2023-11-02 DIAGNOSIS — Z79899 Other long term (current) drug therapy: Secondary | ICD-10-CM

## 2023-11-02 MED ORDER — METHYLPHENIDATE HCL ER (OSM) 27 MG PO TBCR: 27.0000 mg | EXTENDED_RELEASE_TABLET | Freq: Every morning | ORAL | 0 refills | Status: AC

## 2023-11-02 MED ORDER — QUETIAPINE FUMARATE 300 MG PO TABS: 300.0000 mg | ORAL_TABLET | Freq: Every day | ORAL | 0 refills | Status: DC

## 2023-11-02 MED ORDER — VITAMIN D (ERGOCALCIFEROL) 1.25 MG (50000 UNIT) PO CAPS: 50000.0000 [IU] | ORAL_CAPSULE | ORAL | 1 refills | Status: AC

## 2023-11-02 MED ORDER — PROPRANOLOL HCL 10 MG PO TABS: ORAL_TABLET | ORAL | 0 refills | Status: DC

## 2023-11-02 MED ORDER — LITHIUM CARBONATE 300 MG PO CAPS: 600.0000 mg | ORAL_CAPSULE | Freq: Every day | ORAL | 1 refills | Status: AC

## 2023-11-02 NOTE — Progress Notes (Signed)
 Brian Guerrero [id number] 1950-05-22 73 y.o.     Subjective:   Patient ID:  Brian Guerrero is a 73 y.o. (DOB 1950/06/30) male.  Chief Complaint:  Chief Complaint  Patient presents with   Follow-up   Depression   Manic Behavior   Anxiety   ADD   Sleeping Problem      ADEOLUWA SILVERS presents to the office today for follow-up of med change.  At visit  March 07, 2018.  For problems with attention and focus Concerta  was increased from 27 mg a day to 30 mg a day.  In January we reduced the Wellbutrin  and added Concerta  27.  More stamina and more alert but still struggle with concentration and memory and tremor.  Can't say if it's new or a lifelong thing.  Tolerating the Concerta  OK.  Thinks he could tolerate an increase in the Concerta .  Occ propranolol  for tremor.  seen Jun 02, 2018.  No meds were changed. He had received benefit from the increase Concerta  given in March.  seen 10/05/18.  Has stopped Concerta  bc cost prohibitive.  Taking instead Ritalin  and can't remember it TID.  Does get benefit but liked Concerta  better.  Overall good and likes retirement.  Was too stressed at work before..  Tremors resolved.  Doing hobbies and travel. Dorthea concerned about episode of mania she noticed in May.  Reduced sleep, faster driving, spending and talking faster.  Not more severe.  Lasted a few weeks and resolved after switch to Ritalin .  She doesn't see this now.  He didn't think it lasted more than a few days.  She thinks he didn't recognize it.  It was not severe. No med changes.  05/16/19 appt noted.\ Doing well with mood.  Liked Concerta  better and hard to take 3 daily of Ritalin .  Otherwise no med changes.  Cataract surgery helped. Enjoyed retirements. Patient reports stable mood and denies depressed or irritable moods.  Patient denies any recent difficulty with anxiety. Rare anxiety.   Patient denies difficulty with sleep initiation or maintenance. Denies appetite  disturbance but he's had 20# weight loss. He's getting less sugar in retirement and stays active.  Patient reports that energy and motivation have been good.  Patient denies any difficulty with concentration.  Patient denies any suicidal ideation.  Sleep 8 hours.  Not oversleeping. Retired at 73 yo. Feels good about it.  Enjoying it so far.  Working on new schedule.  Tackling some home projects. Motivation is better.  No depression.   Plan no med changes:  02/26/2020 appointment with following noted: It is been a number of months since the last appointment.  Longer than requested by MD. Doing well and liking retirement. Motivation is still not great but Ritalin  helps.   Tremor no worse. As always, wanting to ask about cutting back on lithium  and quetiapine  bc under much less stress with retirement.  Didn't realize how much he was stressed. Quetiapine  very effective for sleep and can't sleep if misses a dose. Admits he decreased quetiapine  from 600 to 450 mg HS months ago. Patient reports stable mood and denies depressed or irritable moods.  Patient denies any recent difficulty with anxiety.  Patient denies difficulty with sleep initiation or maintenance. Denies appetite disturbance.  Patient reports that energy and motivation have been good.  Patient denies any difficulty with concentration.  Patient denies any suicidal ideation. Plan: Continue lithium  600 mg daily and  OK to try reducing quetiapine  to 300 mg nightly  for bipolar depression. Continue Wellbutrin  to 300 mg daily and  Ritalin  10 TID mg daily.    08/26/2020 appointment with the following noted: Seems to be working fine.  Rare sleep problems.  No SE with meds and no hangover. Overall good mood.  Dorthea noticed 3 day period of hypomania and he tried to address by increasing sleep and it resolved.  Not totally clear of depressive sx but not debilitating and Ritalin  clearly helps functions and  alertness. Still some tremor. 1-2 cups coffee  in AM. Plan: Continue meds except Wants to see again if Concerta  could be affordable in place of Ritalin  bc smoother and longer acting.  OK 36 mg  Watch vitamin D  level  12/23/2020 appointment with the following noted: Wants to try Concerta  again instead of Ritalin  10 TID. Doing well otherwise.  Mood is ok.  Denies unusual mood swings but mild hypomania 4-5 days recenlty without being problematic. Had neuropsych testing. Memory stable.  Suggesting psychotherapy. Propranolol  helps tremor.   Plan: Continue lithium  600 mg daily, quetiapine  300 mg nightly, Wellbutrin  300 mg daily and Ritalin  10 mg 3 times daily.  He is also taking propranolol  as needed tremor  04/01/2021 phone call.  Saw a neurologist about her tremors.  Wonders about stopping a medication that could be contributing to tremor.  Wife also voiced concerns about potential mild manic symptoms. Recommended that he stop the Wellbutrin  which could contribute to both issues.  Call if symptoms worsen.  04/29/2021 appointment with the following noted:  seen with wife Dorthea Got temporarily supply of concerta  but insurance won't pay for more. Liked Concerta  bc more effective, but maybe a little too strong with jumpiness. Stopped Wellbutrin  about 4 weeks ago.  Didn't notice much change. Tremors are much improved overall. He thinks his mood is improved and pretty stable. Excitable.not depressed.  Sleep excellent with quetiapine . Admits being more hyper.  More aggressive out of character and others have noticed.  Increased sexual interest. Family concerned about his memory and has seen neurologist.  Wife concerned about his posture.  Wife says kids noticed a little aggressiveness and some mild manic sx with less need for sleep. She noticed for several mos.  Wife sees less aggression off the Wellbutrin  DX [facility] per neuropsych testing. Plan: DC Wellbutrin  and continue Ritalin  10 TID mg daily or Concerta  27 mg AM.    07/28/2021 appointment with the  following noted: Remains on lithium  300 mg twice daily, Concerta  27 or 36 mg daily versus Ritalin  10 mg 3 times daily depending on availability, propranolol  20 to 40 mg twice daily as needed tremor, quetiapine  300 mg nightly Most recent Concerta  27 mg dose was #60 instead of #90.  He feels he's gotten more tolerant to Concerta  and no SE Sees benefits with stimulants for task completion and more willing to do things and less fear of mistakes and more energy. Denies manic sx lately and thinks Dorthea would agree. Sleep very well.   Tremors much improved but still affects fine motor.  Not depressed.    Anxiety usually ok. Fixing up second home in [address] ready for year round and doesn't like to call people which causes anxiety. Can be edgy driving with wife bc they are different. Plan: vitamin D . It was drawn late 2022= 38 5000 units through winter and then drop back to 2000 units daily  01/06/2022 appointment noted: Good  Xmas with kids and grandkids.  Planning to move to [address] eventually in the next  year or two.   Still some struggles with concentration and lack of interest.  Should be more motivated but nothing has really changed much.  Can enjoy things.  Is productive when in [address]. Psych meds: lithium  300 BID, Concerta  27 AM, quetiapine  300 HS , propranonol 10 prn.   No SE Concerta  helps morning energy.  Not very productive by 4 pm. Plan: Check lithium  level and continue other meds.  07/21/2022 appointment noted: Current psych meds lithium  300 mg twice daily, Concerta  27 mg every morning, propranolol  10 mg tablets 1-2 twice daily as needed for tremor, vitamin B6 for tremor, quetiapine  300 mg nightly, vitamin D .  Because level the 30s. Lithium  level ordered at the last visit but received. Half here and half in [place]. Will be in [place] until 08/24/22 and back in October.   Met some friendly people there. Some struggle with motivation but nothing unusual with mood and anxiety.  Tremor are a good  bit better.  Still taking B6 and propranolol .  No other SE. Sleep good with quetiapine  and acitivity. Good interest and enjoyment generally.  No sig anxiety other than normal worry. Health is good. W enjoys hiking.  He struggles to keep up with her.    01/25/23 appt noted: Current psych meds lithium  300 mg 2 each evening, Concerta  27 mg every morning, propranolol  10 mg tablets 1-2 twice daily as needed for tremor, vitamin B6 for tremor, quetiapine  300 mg nightly, vitamin D .  Because level the 30s.  Vid D 50K weekly. Lithium  level ordered at the last visit but received. Felt responsible for aunt Alfonse in [place] in hosp then rehab.  Had to help her move out of apt into [place].  Stressful and overwhelming but not as bad as he worried it would be.   Working on rennovation of their place in [place].   Seems like could use some more counseling.  Gets too overwhelmed.  Over worry with minor stressors.  Will be in [place] until about July.   No SE. No sedation from quetiapine .  Some incr waking in middle of night from time to time.  Still effective overall.  Avg 8-9 hours sleep. No mania.  No mood swings.  Some diff with motivation.  Went through period of down on himself but coming out of it.   Tremor not problem unless more than 2 cups coffee.   Plan: Check lihtium level and vit D.  07/25/23 appt noted:  Med: NAC, lithium  300 mg capsules 2 nightly, Concerta  27 every morning, propranolol  10 mg tablets 2 to 4 tablets twice daily as needed tremor, B6  for tremor, quetiapine  300 nightly. Was overwhelmed with move and other factors.  Got overwhelmed and started therapy.  In AM awakens an hour early with anxiety.  Lasts until gets OOB usually an hour later.  Then it subsides some.  Except with stresss.    EMA for 3-4 mos almost daily.  Not tired or sleepy during the day.  Avg 8 hour sleep.  Hopes to move to [place] in about November.  Some neg self talk and down on himself but shakes it off.  More  anxious.   Plan  no  changes  11/02/23 appt noted:  Med: NAC, lithium  300 mg capsules 2 nightly, Concerta  27 every morning, propranolol  10 mg tablets 2 tablets each morning, B6 had been recommended for tremor, quetiapine  300 nightly. Concerns about getting Concerta  in the move to [place].  No family left here.  [place]  Move will happen in Dec. Mental health ok.  Really helped that he can let others helps with move and move decisions.  He found this phase to be daunting otherwise.  Dorthea is helpeful. Therapist [place] has been helpful. Reaching out to people he hasn't contacted in a long time and is positive.   Not sig dep.  No sig mood swings. Tremor reduced and can write better . Some fatigue.  Sleep is good.    Past Psychiatric Medication Trials: Propranolol  20 AM Lithium  600 mg max dose tolerated DT tremor Seroquel  300 Wellbutrin  300 Ritalin  10 mg TID Concerta  36 too much  M history of severe TRD  Review of Systems:  Review of Systems  Respiratory:  Negative for shortness of breath.   Cardiovascular:  Negative for chest pain and palpitations.  Neurological:  Negative for tremors.       No falls  Psychiatric/Behavioral:  Negative for agitation, behavioral problems, confusion, decreased concentration, dysphoric mood, hallucinations, self-injury, sleep disturbance and suicidal ideas. The patient is not nervous/anxious and is not hyperactive.   tremor variable worse with caffeine  Medications: I have reviewed the patient's current medications.  Current Outpatient Medications  Medication Sig Dispense Refill   Acetylcysteine (NAC 600 PO) Take 1,200 mg by mouth daily.     lithium  carbonate 300 MG capsule Take 2 capsules (600 mg total) by mouth daily. 180 capsule 0   methylphenidate  27 MG PO CR tablet Take 1 tablet (27 mg total) by mouth every morning. 30 tablet 0   methylphenidate  27 MG PO CR tablet Take 1 tablet (27 mg total) by mouth every morning. 30 tablet 0   methylphenidate  27 MG PO CR tablet  Take 1 tablet (27 mg total) by mouth every morning. 30 tablet 0   propranolol  (INDERAL ) 10 MG tablet Take 2-4 tablets by mouth twice daily as needed for tremor (Patient taking differently: Take 20 mg by mouth every morning. Take 2-4 tablets by mouth twice daily as needed for tremor) 540 tablet 0   Pyridoxine HCl (VITAMIN B6) 100 MG TABS Take 500 mg by mouth daily.     QUEtiapine  (SEROQUEL ) 300 MG tablet Take 1 tablet (300 mg total) by mouth at bedtime. 3 tablet 0   Carnitine-B5-B6 (L-CARNITINE 500 PO) Take 1,000 mg by mouth daily. (Patient not taking: Reported on 08/17/2023)     ibuprofen (ADVIL) 800 MG tablet Take 800 mg by mouth.     sildenafil (VIAGRA) 100 MG tablet TAKE 1 TABLET BY MOUTH AS NEEDED AS DIRECTED (Patient not taking: Reported on 08/17/2023) 30 tablet 11   Vitamin D , Ergocalciferol , (DRISDOL ) 1.25 MG (50000 UNIT) CAPS capsule TAKE 1 CAPSULE BY MOUTH ONCE WEEKLY (Patient not taking: Reported on 11/02/2023) 15 capsule 1   Current Facility-Administered Medications  Medication Dose Route Frequency Provider Last Rate Last Admin   0.9 %  sodium chloride  infusion  500 mL Intravenous Once Nandigam, Kavitha V, MD        Medication Side Effect:  noted above  Allergies:  Allergies  Allergen Reactions   Lincomycin Hcl Hives    As a child/had a hive    Past Medical History:  Diagnosis Date   Attention or concentration deficit    Benign prostatic hyperplasia without urinary obstruction 08/19/2023   Bipolar 2 disorder    Cataract    Depression    Diverticular disease    [facility]/per pt not aware of this   Elevated PSA    GAD (generalized anxiety  disorder) 10/23/2017   Hyperglycemia    Lithium -induced tremor    Onychomycosis 08/19/2023   Prediabetes    Reticular venous varices    Squamous cell carcinoma of skin of face 08/19/2023   Wears glasses     Family History  Problem Relation Age of Onset   Depression Mother    Prostate cancer Father    Kidney disease Father     Dementia Father        unspecified type; symptom onset in late 80s/early 25s   Tremor Father    Parkinson's disease Sister    Colon cancer Neg Hx    Rectal cancer Neg Hx    Stomach cancer Neg Hx    Esophageal cancer Neg Hx     Social History   Socioeconomic History   Marital status: Married    Spouse name: Not on file   Number of children: Not on file   Years of education: 17   Highest education level: Bachelor's degree (e.g., BA, AB, BS)  Occupational History   Not on file  Tobacco Use   Smoking status: Never   Smokeless tobacco: Never  Vaping Use   Vaping status: Never Used  Substance and Sexual Activity   Alcohol use: Yes    Alcohol/week: 7.0 standard drinks of alcohol    Types: 7 Cans of beer per week    Comment: 1 beer nightly   Drug use: Never   Sexual activity: Yes  Other Topics Concern   Not on file  Social History Narrative   Living with wife, 2 story home, 17 steps   Right handed   Coffee 2-3 cups per day   Social Drivers of Health   Financial Resource Strain: Low Risk  (04/01/2023)   Received from [facility]   Overall Financial Resource Strain (CARDIA)    Difficulty of Paying Living Expenses: Not hard at all  Food Insecurity: No Food Insecurity (04/01/2023)   Received from [facility]   Hunger Vital Sign    Within the past 12 months, you worried that your food would run out before you got the money to buy more.: Never true    Within the past 12 months, the food you bought just didn't last and you didn't have money to get more.: Never true  Transportation Needs: No Transportation Needs (04/01/2023)   Received from [facility] - Transportation    Lack of Transportation (Medical): No    Lack of Transportation (Non-Medical): No  Physical Activity: Not on file  Stress: Not on file  Social Connections: Not on file  Intimate Partner Violence: Not on file    Past Medical History, Surgical history, Social history, and Family history were  reviewed and updated as appropriate.   Please see review of systems for further details on the patient's review from today.   Objective:   Physical Exam:  There were no vitals taken for this visit.  Physical Exam Constitutional:      General: He is not in acute distress. Musculoskeletal:        General: No deformity.  Neurological:     Mental Status: He is alert and oriented to person, place, and time.     Cranial Nerves: No dysarthria.     Motor: Tremor present.     Coordination: Coordination normal.  Psychiatric:        Attention and Perception: Attention and perception normal. He does not perceive auditory or visual hallucinations.  Mood and Affect: Mood is anxious. Mood is not depressed. Affect is not labile, blunt, angry, tearful or inappropriate.        Speech: Speech normal. Speech is not rapid and pressured.        Behavior: Behavior normal. Behavior is not slowed or hyperactive. Behavior is cooperative.        Thought Content: Thought content normal. Thought content is not paranoid or delusional. Thought content does not include homicidal or suicidal ideation. Thought content does not include suicidal plan.        Cognition and Memory: Cognition and memory normal.        Judgment: Judgment normal.     Comments: Insight fair.  Perhaps mild depression not bad but easily stressed and overwhelmed.  Manageable.     Lab Review:     Component Value Date/Time   NA 140 07/28/2023 0855   K 4.5 07/28/2023 0855   CL 106 07/28/2023 0855   CO2 20 07/28/2023 0855   GLUCOSE 98 07/28/2023 0855   GLUCOSE 93 07/30/2022 1537   BUN 18 07/28/2023 0855   CREATININE 0.96 07/28/2023 0855   CREATININE 1.15 07/30/2022 1537   CALCIUM 9.2 07/28/2023 0855   GFRNONAA 75 08/31/2019 1631   GFRAA 86 08/31/2019 1631       Component Value Date/Time   WBC 6.4 04/26/2013 0930   RBC 4.69 04/26/2013 0930   HGB 14.6 04/26/2013 0930   HCT 42.3 04/26/2013 0930   PLT 266 04/26/2013 0930    MCV 90.2 04/26/2013 0930   MCH 31.1 04/26/2013 0930   MCHC 34.5 04/26/2013 0930   RDW 13.4 04/26/2013 0930   LYMPHSABS 1.2 04/26/2013 0930   MONOABS 0.7 04/26/2013 0930   EOSABS 0.3 04/26/2013 0930   BASOSABS 0.0 04/26/2013 0930    Lithium  Lvl  Date Value Ref Range Status  07/28/2023 0.5 0.5 - 1.2 mmol/L Final    Comment:    A concentration of 0.5-0.8 mmol/L is advised for long-term use; concentrations of up to 1.2 mmol/L may be necessary during acute treatment.                                  Detection Limit = 0.1                           <0.1 indicates None Detected     No results found for: PHENYTOIN, PHENOBARB, VALPROATE, CBMZ   .res Assessment: Plan:    Bipolar I disorder (HCC)  Generalized anxiety disorder  Attention deficit hyperactivity disorder (ADHD), predominantly inattentive type  Insomnia due to mental condition  Low vitamin D  level  Lithium -induced tremor  Lithium  use   Mr. Bulger has a history of bipolar depression and anxiety along with his problems with concentration are related to a type of acquired ADD due to the consequence of chronic bipolar depression with inadequate response to typical bipolar disorder medications as well as Wellbutrin .  Since this concentration problem was serious enough to be affecting his job function and potentially forcing retirement I offered him the option of treating it with stimulants.   He has retired now but still needs the stimulant to help with concentration and focus, motivation and productivity at home.  Retirement a benefit to mental health. Disc importance of staying active to manage risk of depression.    Overall doing well wihtout new complaints.  Stable with  meds.  Previously discussed the neurology and neuropsych testing with the patient and his wife.  It is unlikely that psych meds are causing cognitive impairment.  It is possible that they have been causing or contributing to tremor.    reduced  the Concerta  to 27 mg daily to minimize tremor and reduce manic risk. Discussed potential benefits, risks, and side effects of stimulants with patient to include increased heart rate, palpitations, insomnia, increased anxiety, increased irritability, or decreased appetite.  Instructed patient to contact office if experiencing any significant tolerability issues.   The main benefit is mood, energy, alertness.  Still some conc and motivation px.  Concerta  27 mg AM.    Tremor related to medications have recurred and he saw neurologist and disc that and neuropsych testing.  Tremor is much better unless drinks too much caffeine.  Disc this. Propranolol  for tremor 10- 30 mg BID prn .  He reports it helps the tremor. Disc SE in detail.  Continue lithium  600 mg daily and  Cont quetiapine  to 300 mg nightly for bipolar depression.  Discussed potential metabolic side effects associated with atypical antipsychotics, as well as potential risk for movement side effects. Advised pt to contact office if movement side effects occur.   AGAIN Counseled patient regarding potential benefits, risks, and side effects of lithium  to include potential risk of lithium  affecting thyroid  and renal function.  Discussed need for periodic lab monitoring to determine drug level and to assess for potential adverse effects.  Counseled patient regarding signs and symptoms of lithium  toxicity and advised that they notify office immediately or seek urgent medical attention if experiencing these signs and symptoms.  Patient advised to contact office with any questions or concerns. Need lithium  level to reduce risk toxicity.   Extensive discussion about finding new psychiatrist and rec not making major med changes   AGAIN rec CHECK lithium  level and labs   His vitamin D  level was 36 in past and needs to be repeated and consideration could be given to increasing vitamin D . It was drawn late 2022= 38 Given Rx vit D continue it.   Repeat level.  No med changes: NAC, lithium  300 mg capsules 2 nightly, Concerta  27 every morning, propranolol  10 mg tablets 2 to 4 tablets twice daily as needed tremor, B6 had been recommended for tremor, quetiapine  300 nightly.  Helped by counseling for better stress management  Moving to town near [place], [place] and will have RX at [facility] there.   Disc how to get RX there.  Will need to cover him for 6 mos bc hard to get into doctor there. FU  in [place]  Lorene Macintosh, MD, DFAPA   Future Appointments  Date Time Provider Department Center  11/07/2023  8:45 AM Tat, [place] RAMAN, DO [facility] None  11/08/2023 10:00 AM Bernardo Bruckner, [facility] None  11/22/2023  9:00 AM Bernardo Bruckner, [facility] None       No orders of the defined types were placed in this encounter.     -------------------------------

## 2023-11-02 NOTE — Progress Notes (Signed)
 Assessment/Plan:  [facility] well within normal limits Neurocognitive testing was done in August 22, 2023 with Dr. Richie.  This demonstrated mild cognitive impairment, but Dr. Richie also noted the very mild nature of this diagnostic classification should be emphasized at the present time.  The examination was also fairly stable compared to his 2022 examination.  Dr. Richie did note that bipolar disorder, medications and possible ADHD could be playing a role into perceived memory issues. MRI brain reviewed with patient and reassurance provided   2.  Tremor             -This is likely due to the lithium , but he thinks that it has gotten better with time.  Patient is certainly on other medications that could contribute to tremor (quetiapine , methylphenidate ) but it is likely the lithium  that is the main source of tremor).  He finds propranolol  10mg , 2 po q AM helpful without SE Subjective:   ARLESTER KEEHAN was seen today in neurologic f/u.  Patient was seen recently, at which point he described memory loss, which has been a complaint for a long time.  Neurocognitive testing was done in August 22, 2023 with Dr. Richie.  This demonstrated mild cognitive impairment, but Dr. Richie also noted the very mild nature of this diagnostic classification should be emphasized at the present time.  The examination was also fairly stable compared to his 2022 examination.  MRI brain done since last visit demonstrated mild white matter disease.  Patient does have history of hyperglycemia, prediabetes.  He is clinically doing well and getting ready to move to [place] .  Tremor is gradually getting better.      ALLERGIES:   Allergies  Allergen Reactions   Lincomycin Hcl Hives    As a child/had a hive    CURRENT MEDICATIONS:  Outpatient Encounter Medications as of 11/07/2023  Medication Sig   Acetylcysteine (NAC 600 PO) Take 1,200 mg by mouth daily.   ibuprofen (ADVIL) 800 MG tablet Take 800 mg by mouth.    lithium  carbonate 300 MG capsule Take 2 capsules (600 mg total) by mouth daily.   methylphenidate  27 MG PO CR tablet Take 1 tablet (27 mg total) by mouth every morning.   methylphenidate  27 MG PO CR tablet Take 1 tablet (27 mg total) by mouth every morning.   methylphenidate  27 MG PO CR tablet Take 1 tablet (27 mg total) by mouth every morning.   propranolol  (INDERAL ) 10 MG tablet Take 2-4 tablets by mouth twice daily as needed for tremor   Pyridoxine HCl (VITAMIN B6) 100 MG TABS Take 500 mg by mouth daily.   QUEtiapine  (SEROQUEL ) 300 MG tablet Take 1 tablet (300 mg total) by mouth at bedtime.   Vitamin D , Ergocalciferol , (DRISDOL ) 1.25 MG (50000 UNIT) CAPS capsule Take 1 capsule (50,000 Units total) by mouth once a week.   [DISCONTINUED] Carnitine-B5-B6 (L-CARNITINE 500 PO) Take 1,000 mg by mouth daily. (Patient not taking: Reported on 08/17/2023)   [DISCONTINUED] lithium  carbonate 300 MG capsule Take 2 capsules (600 mg total) by mouth daily.   [DISCONTINUED] methylphenidate  27 MG PO CR tablet Take 1 tablet (27 mg total) by mouth every morning.   [DISCONTINUED] propranolol  (INDERAL ) 10 MG tablet Take 2-4 tablets by mouth twice daily as needed for tremor (Patient taking differently: Take 20 mg by mouth every morning. Take 2-4 tablets by mouth twice daily as needed for tremor)   [DISCONTINUED] QUEtiapine  (SEROQUEL ) 300 MG tablet Take 1 tablet (300 mg total) by  mouth at bedtime.   [DISCONTINUED] sildenafil (VIAGRA) 100 MG tablet TAKE 1 TABLET BY MOUTH AS NEEDED AS DIRECTED (Patient not taking: Reported on 08/17/2023)   [DISCONTINUED] Vitamin D , Ergocalciferol , (DRISDOL ) 1.25 MG (50000 UNIT) CAPS capsule TAKE 1 CAPSULE BY MOUTH ONCE WEEKLY (Patient not taking: Reported on 11/02/2023)   Facility-Administered Encounter Medications as of 11/07/2023  Medication   0.9 %  sodium chloride  infusion    Objective:   PHYSICAL EXAMINATION:    VITALS:   Vitals:   11/07/23 0859  BP: 126/88  Pulse: 72   SpO2: 98%  Weight: 188 lb (85.3 kg)     GEN:  Normal appears male in no acute distress.  Appears stated age. HEENT:  Normocephalic, atraumatic. The mucous membranes are moist. The superficial temporal arteries are without ropiness or tenderness. Cardiovascular: Regular rate and rhythm. Lungs: Clear to auscultation bilaterally. Neck/Heme: There are no carotid bruits noted bilaterally.  NEUROLOGICAL: Orientation: Patient is alert and oriented x 3 today.    08/17/2023   10:00 AM  [facility]real Cognitive Assessment   Visuospatial/ Executive (0/5) 4  Naming (0/3) 3  Attention: Read list of digits (0/2) 2  Attention: Read list of letters (0/1) 1  Attention: Serial 7 subtraction starting at 100 (0/3) 3  Language: Repeat phrase (0/2) 2  Language : Fluency (0/1) 1  Abstraction (0/2) 2  Delayed Recall (0/5) 4  Orientation (0/6) 6  Total 28    Cranial nerves: There is good facial symmetry.  Extraocular muscles are intact and visual fields are full to confrontational testing. Speech is fluent and clear. Hearing is intact to conversational tone. Tone: Tone is good throughout. Sensation: Sensation is intact to light touch throughout.  No extinction with double simultaneous stimulation. Coordination:  The patient has no difficulty with RAM's or FNF bilaterally. Motor: Strength is 5/5 in the bilateral upper and lower extremities.  Shoulder shrug is equal and symmetric. There is no pronator drift.  There are no fasciculations noted. Gait and Station: The patient is able to ambulate without difficulty. The patient is able to ambulate in a tandem fashion. The patient is able to stand in the Romberg position. Abnormal movements: There is mild to mod tremor of the outstretched hands and with intention      Cc:  Seabron Lenis, MD

## 2023-11-07 ENCOUNTER — Encounter: Payer: Self-pay | Admitting: Neurology

## 2023-11-07 ENCOUNTER — Ambulatory Visit: Admitting: Neurology

## 2023-11-07 VITALS — BP 126/88 | HR 72 | Wt 188.0 lb

## 2023-11-07 DIAGNOSIS — R9402 Abnormal brain scan: Secondary | ICD-10-CM | POA: Diagnosis not present

## 2023-11-07 DIAGNOSIS — G3184 Mild cognitive impairment, so stated: Secondary | ICD-10-CM | POA: Diagnosis not present

## 2023-11-07 DIAGNOSIS — G251 Drug-induced tremor: Secondary | ICD-10-CM

## 2023-11-08 ENCOUNTER — Ambulatory Visit: Admitting: Mental Health

## 2023-11-08 DIAGNOSIS — F900 Attention-deficit hyperactivity disorder, predominantly inattentive type: Secondary | ICD-10-CM

## 2023-11-08 DIAGNOSIS — F411 Generalized anxiety disorder: Secondary | ICD-10-CM | POA: Diagnosis not present

## 2023-11-08 DIAGNOSIS — F319 Bipolar disorder, unspecified: Secondary | ICD-10-CM

## 2023-11-08 NOTE — Progress Notes (Signed)
 [facility] Psychotherapy Note  Name: Brian Guerrero Date: 11/08/2023 MRN: [id number] DOB: 07/23/1950 PCP: Seabron Lenis, MD  Time spent:  50 minutes  Treatment:  Individual therapy  Mental Status Exam:    Appearance:    Casual     Behavior:   Appropriate  Motor:   WNL  Speech/Language:    Clear and Coherent  Affect:   Full range   Mood:   Anxious, pleasant  Thought process:   Logical, linear, goal directed  Thought content:     WNL  Sensory/Perceptual disturbances:     none  Orientation:   x4  Attention:   Good  Concentration:   Good  Memory:   Intact  Fund of knowledge:    Consistent with age and development  Insight:     Good  Judgment:    Good  Impulse Control:   Good    Reported Symptoms:  anxiety    Risk Assessment: Danger to Self:  No Self-injurious Behavior: No Danger to Others: No Duty to Warn:no Physical Aggression / Violence:No  Access to Firearms a concern: No  Gang Involvement:No  Patient / guardian was educated about steps to take if suicide or homicide risk level increases between visits: yes While future psychiatric events cannot be accurately predicted, the patient does not currently require acute inpatient psychiatric care and does not currently meet [place]  involuntary commitment criteria.     Subjective:  Patient arrived on time for today's session.  He shared recent events, how he and his wife have sold their house, pending closing.  He stated they are packing up and he has struggled somewhat with motivation and consistency.  He stated that his wife does not have this challenge, how she is very effective.  He stated the relationship has been going well lately, he stated that she verbalized how she is trying to be not as negative, how she has this tendency at times.  He stated that he discontinued talking to a woman he was communicating with online.  He stated he has no desire to continue with those behaviors.  He shared how he  wants to communicate with others he is not spoken to them several years that live locally.  Through further guided discovery, he further identified how he finds himself at times trying to sound intelligent to others in conversation.  He stated that it affects him when this is not validated by others.  He stated that he feels this tendency goes back to his childhood, doing well in school but then struggling in middle school comparatively.  Facilitated his framing thoughts when he feels self-conscious and/or self-critical.  He [place].    Interventions: Motivational interviewing, supportive therapy, CBT  Diagnoses:    ICD-10-CM   1. Bipolar I disorder (HCC)  F31.9     2. Generalized anxiety disorder  F41.1     3. Attention deficit hyperactivity disorder (ADHD), predominantly inattentive type  F90.0        ADHD By history  Plan of Care: Patient to follow through with coping as discussed in session.  Patient to continue to utilize his support system.   Lonni Fischer, [facility]

## 2023-11-09 LAB — LITHIUM LEVEL: Lithium Lvl: 0.5 mmol/L (ref 0.5–1.2)

## 2023-11-12 ENCOUNTER — Other Ambulatory Visit: Payer: Self-pay | Admitting: Psychiatry

## 2023-11-12 DIAGNOSIS — F319 Bipolar disorder, unspecified: Secondary | ICD-10-CM

## 2023-11-16 ENCOUNTER — Institutional Professional Consult (permissible substitution): Admitting: Psychology

## 2023-11-16 ENCOUNTER — Ambulatory Visit: Payer: Self-pay

## 2023-11-17 ENCOUNTER — Ambulatory Visit: Payer: Self-pay | Admitting: Gastroenterology

## 2023-11-22 ENCOUNTER — Ambulatory Visit: Admitting: Mental Health

## 2023-11-24 ENCOUNTER — Encounter: Admitting: Psychology

## 2024-01-06 ENCOUNTER — Other Ambulatory Visit: Payer: Self-pay | Admitting: Psychiatry

## 2024-01-06 DIAGNOSIS — G251 Drug-induced tremor: Secondary | ICD-10-CM

## 2024-01-21 ENCOUNTER — Other Ambulatory Visit: Payer: Self-pay | Admitting: Psychiatry

## 2024-01-21 DIAGNOSIS — F319 Bipolar disorder, unspecified: Secondary | ICD-10-CM

## 2024-02-06 ENCOUNTER — Telehealth: Payer: Self-pay | Admitting: Psychiatry

## 2024-02-06 NOTE — Telephone Encounter (Signed)
 Pt called and LM today. Please send in a refill on Methylphenidate  for him to [facility] in [place], [place] on file. Note says he should find a provider in [place].
